# Patient Record
Sex: Male | Born: 1960
Health system: Southern US, Community
[De-identification: ages and names within clinical notes are randomized; demographics above are authoritative.]

## PROBLEM LIST (undated history)

## (undated) DIAGNOSIS — I2510 Atherosclerotic heart disease of native coronary artery without angina pectoris: Secondary | ICD-10-CM

## (undated) DIAGNOSIS — I252 Old myocardial infarction: Secondary | ICD-10-CM

## (undated) DIAGNOSIS — K219 Gastro-esophageal reflux disease without esophagitis: Secondary | ICD-10-CM

## (undated) DIAGNOSIS — J189 Pneumonia, unspecified organism: Secondary | ICD-10-CM

## (undated) DIAGNOSIS — J45909 Unspecified asthma, uncomplicated: Secondary | ICD-10-CM

## (undated) DIAGNOSIS — E7800 Pure hypercholesterolemia, unspecified: Secondary | ICD-10-CM

## (undated) DIAGNOSIS — E119 Type 2 diabetes mellitus without complications: Secondary | ICD-10-CM

## (undated) DIAGNOSIS — E785 Hyperlipidemia, unspecified: Secondary | ICD-10-CM

## (undated) DIAGNOSIS — F32A Depression, unspecified: Secondary | ICD-10-CM

## (undated) DIAGNOSIS — I10 Essential (primary) hypertension: Secondary | ICD-10-CM

## (undated) DIAGNOSIS — K5150 Left sided colitis without complications: Secondary | ICD-10-CM

## (undated) DIAGNOSIS — M797 Fibromyalgia: Secondary | ICD-10-CM

## (undated) DIAGNOSIS — M459 Ankylosing spondylitis of unspecified sites in spine: Secondary | ICD-10-CM

## (undated) DIAGNOSIS — K5190 Ulcerative colitis, unspecified, without complications: Secondary | ICD-10-CM

## (undated) DIAGNOSIS — N529 Male erectile dysfunction, unspecified: Secondary | ICD-10-CM

## (undated) DIAGNOSIS — M1990 Unspecified osteoarthritis, unspecified site: Secondary | ICD-10-CM

## (undated) DIAGNOSIS — E782 Mixed hyperlipidemia: Secondary | ICD-10-CM

## (undated) DIAGNOSIS — C44101 Unspecified malignant neoplasm of skin of unspecified eyelid, including canthus: Secondary | ICD-10-CM

## (undated) DIAGNOSIS — R972 Elevated prostate specific antigen [PSA]: Secondary | ICD-10-CM

## (undated) DIAGNOSIS — D649 Anemia, unspecified: Secondary | ICD-10-CM

## (undated) DIAGNOSIS — I219 Acute myocardial infarction, unspecified: Secondary | ICD-10-CM

## (undated) DIAGNOSIS — S0300XA Dislocation of jaw, unspecified side, initial encounter: Secondary | ICD-10-CM

## (undated) HISTORY — DX: Fibromyalgia: M79.7

## (undated) HISTORY — DX: Hyperlipidemia, unspecified: E78.5

## (undated) HISTORY — PX: COLONOSCOPY: SHX174

## (undated) HISTORY — DX: Unspecified malignant neoplasm of skin of unspecified eyelid, including canthus: C44.101

## (undated) HISTORY — DX: Male erectile dysfunction, unspecified: N52.9

## (undated) HISTORY — DX: Anemia, unspecified: D64.9

## (undated) HISTORY — DX: Unspecified osteoarthritis, unspecified site: M19.90

## (undated) HISTORY — DX: Left sided colitis without complications: K51.50

## (undated) HISTORY — DX: Mixed hyperlipidemia: E78.2

## (undated) HISTORY — DX: Unspecified asthma, uncomplicated: J45.909

## (undated) HISTORY — PX: EYE SURGERY: SHX253

## (undated) HISTORY — DX: Atherosclerotic heart disease of native coronary artery without angina pectoris: I25.10

## (undated) HISTORY — DX: Old myocardial infarction: I25.2

## (undated) HISTORY — DX: Ankylosing spondylitis of unspecified sites in spine: M45.9

## (undated) HISTORY — DX: Ulcerative colitis, unspecified, without complications: K51.90

## (undated) HISTORY — PX: TONSILLECTOMY: SUR1361

## (undated) HISTORY — DX: Dislocation of jaw, unspecified side, initial encounter: S03.00XA

## (undated) HISTORY — DX: Gastro-esophageal reflux disease without esophagitis: K21.9

## (undated) HISTORY — DX: Hypercalcemia: E83.52

## (undated) HISTORY — DX: Pure hypercholesterolemia, unspecified: E78.00

## (undated) HISTORY — PX: CATARACT EXTRACTION, BILATERAL: SHX1313

## (undated) HISTORY — DX: Pneumonia, unspecified organism: J18.9

## (undated) HISTORY — DX: Elevated prostate specific antigen (PSA): R97.20

---

## 1981-03-19 HISTORY — PX: OTHER SURGICAL HISTORY: SHX169

## 2000-09-13 ENCOUNTER — Encounter (INDEPENDENT_AMBULATORY_CARE_PROVIDER_SITE_OTHER): Payer: Self-pay | Admitting: Specialist

## 2000-09-13 ENCOUNTER — Ambulatory Visit (HOSPITAL_COMMUNITY): Admission: RE | Admit: 2000-09-13 | Discharge: 2000-09-13 | Payer: Self-pay | Admitting: Gastroenterology

## 2002-02-14 ENCOUNTER — Emergency Department (HOSPITAL_COMMUNITY): Admission: EM | Admit: 2002-02-14 | Discharge: 2002-02-15 | Payer: Self-pay | Admitting: Emergency Medicine

## 2003-11-08 ENCOUNTER — Ambulatory Visit (HOSPITAL_COMMUNITY): Admission: RE | Admit: 2003-11-08 | Discharge: 2003-11-08 | Payer: Self-pay | Admitting: Gastroenterology

## 2003-11-08 ENCOUNTER — Encounter (INDEPENDENT_AMBULATORY_CARE_PROVIDER_SITE_OTHER): Payer: Self-pay | Admitting: *Deleted

## 2006-12-04 ENCOUNTER — Emergency Department (HOSPITAL_COMMUNITY): Admission: EM | Admit: 2006-12-04 | Discharge: 2006-12-04 | Payer: Self-pay | Admitting: Emergency Medicine

## 2007-01-07 ENCOUNTER — Inpatient Hospital Stay (HOSPITAL_COMMUNITY): Admission: AD | Admit: 2007-01-07 | Discharge: 2007-01-09 | Payer: Self-pay | Admitting: Interventional Cardiology

## 2007-01-07 ENCOUNTER — Inpatient Hospital Stay (HOSPITAL_BASED_OUTPATIENT_CLINIC_OR_DEPARTMENT_OTHER): Admission: RE | Admit: 2007-01-07 | Discharge: 2007-01-07 | Payer: Self-pay | Admitting: Interventional Cardiology

## 2007-02-26 ENCOUNTER — Encounter: Admission: RE | Admit: 2007-02-26 | Discharge: 2007-02-26 | Payer: Self-pay

## 2008-01-13 ENCOUNTER — Encounter: Admission: RE | Admit: 2008-01-13 | Discharge: 2008-01-13 | Payer: Self-pay | Admitting: Chiropractic Medicine

## 2010-08-01 NOTE — Cardiovascular Report (Signed)
[name], [name] NO.:  [id number]   MEDICAL RECORD NO.:  [id number]          PATIENT TYPE:  OIB   LOCATION:  1963                         FACILITY:  [facility]   PHYSICIAN:  Corky Crafts, MDDATE OF BIRTH:  [birth year]   DATE OF PROCEDURE:  01/07/2007  DATE OF DISCHARGE:  01/07/2007                            CARDIAC CATHETERIZATION   REFERRING:  Henrine Screws, M.D.   PROCEDURE PERFORMED:  Left heart catheterization, left ventriculogram,  coronary angiogram, abdominal aortogram.   OPERATOR:  Lance Muss, M.D.   INDICATIONS:  Abnormal stress test, stable angina.   PROCEDURE NARRATIVE:  The risks and benefits of cardiac catheterization  were explained to the patient and informed consent was obtained.  The  patient was brought to the cath lab.  He was prepped and draped in the  usual sterile fashion.  His right groin was infiltrated 1% lidocaine.  A  4-French arterial sheath was placed into the right femoral artery using  the modified Seldinger technique.  Left coronary artery angiography was  performed using a JL-4 pigtail catheter.  The catheter was advanced to  the vessel ostium under fluoroscopic guidance.  Digital angiography was  performed in multiple projections using hand injection of contrast.  Right coronary artery angiography was then performed using a 3-D RCA  catheter.  The catheter was advanced to the vessel ostium under  fluoroscopic guidance.  Digital angiography was performed in multiple  projections using hand injection of contrast.  A pigtail catheter was  then advanced to the ascending aorta and across the aortic valve under  fluoroscopic guidance.  Power injection of contrast was performed in the  RAO position to image the left ventricle.  The catheter was pulled back  under continuous hemodynamic pressure monitoring.  The catheter was then  pulled back to the level of the renal arteries in the abdominal aorta.  A power injection  of contrast was given in the AP projection.  The  sheath was then removed using manual compression.   FINDINGS:  The left main is widely patent.  The left circumflex is a  medium-sized vessel.  There is a long first obtuse marginal which is  medium size.  There are minor irregularities throughout.  Both are  widely patent.  The left anterior descending is a large caliber vessel  with 95% midvessel stenosis.  There is a 40-50% diffuse stenosis more  distally in the LAD.  The first diagonal is a medium-sized vessel with  an ostial 25% stenosis.  The second diagonal is a medium-sized vessel.  The third diagonal is a small vessel but patent.  Right coronary artery  is a large dominant vessel.  It appears angiographically normal.  The  left ventriculogram reveals a small area of apical hypokinesis.  Overall  estimated ejection fraction is 55-60%.  Abdominal aortogram shows no  abdominal aortic aneurysm.  There are single renal arteries bilaterally,  both of which are widely patent.   HEMODYNAMIC RESULTS:  Left ventricular pressure of 124/7 with an LVEDP  of 13 mmHg.  Aortic pressure 133/89 with a mean aortic pressure of 110  mmHg.   IMPRESSION:  1. Normal left ventricular function.  2. Single-vessel coronary artery disease in the mid left anterior      descending with a stenosis of 95%.  There is moderate      atherosclerosis throughout the remainder of the vessel.  3. Patent right coronary artery and left circumflex.  4. No renal artery stenosis.  No abdominal aortic aneurysm.   RECOMMENDATIONS:  The patient has severe LAD disease.  He will be loaded  with Plavix.  He has had some pain at rest.  We will admit him and add  Integrilin later if his groin is stable.  Will plan for PCI in the  morning.  The LAD lesion does appear somewhat thrombotic and it is  extremely tight.  In the setting of rest pain, I think it is safer to  keep him in the hospital.      Corky Crafts, MD   Electronically Signed     JSV/MEDQ  D:  01/08/2007  T:  01/09/2007  Job:  [id number]

## 2010-08-01 NOTE — Cardiovascular Report (Signed)
[name], [name]              ACCOUNT NO.:  [id number]   MEDICAL RECORD NO.:  [id number]          PATIENT TYPE:  INP   LOCATION:  6531                         FACILITY:  [facility]   PHYSICIAN:  Corky Crafts, MDDATE OF BIRTH:  23-Nov-1960   DATE OF PROCEDURE:  01/08/2007  DATE OF DISCHARGE:                            CARDIAC CATHETERIZATION   PROCEDURE PERFORMED:  PCI of the LAD.   OPERATOR:  Dr. Eldridge Dace.   INDICATIONS:  Unstable angina, abnormal stress test.   PROCEDURE:  The risks and benefits of cardiac catheterization were  explained to the patient and informed consent was obtained.  The patient  was brought to the cath lab.  He was prepped and draped in the usual  sterile fashion.  His right groin was infiltrated with 1% lidocaine.  A  6-French arterial sheath was placed into his right femoral artery using  modified Seldinger technique.  Diagnostic angiography using the guide  catheter showed a 95% mid-LAD lesion.  There was 40-50% moderate disease  noted in the more mid to distal part of the LAD.  This improved after  nitroglycerin and certainly looked much better than it did on  yesterday's diagnostic cath.  Please see below for details of the PCI.   PCI NARRATIVE:  CLS4 guiding catheter was used to intubate the ostium of  the left main.  A Prowater wire was placed in the first diagonal.  A BMW  wire was placed in the LAD.  A 2.5 x 12-mm Voyager balloon was then  inflated to 10 atmospheres for 22 seconds.  This did not significantly  affect the stenosis.  The balloon was replaced and inflated 12  atmospheres for 28 seconds.  There was improved flow.  A 3.5 x 15-mm  Vision stent was then placed across the lesion and deployed at 14  atmospheres for 51 seconds.  The stent was postdilated with a 4.0 x 12-  mm Quantum Maverick balloon inflated to 16 atmospheres for 43 seconds in  the distal part of the stent, and then 16 atmospheres for 28 seconds in  the more proximal  part of the stent.  There is an excellent angiographic  result.  The patient maintained TIMI III flow.  There was no residual  stenosis.   IMPRESSION:  1. Successful bare metal stent placement to the mid LAD with a 3.5 x      15-mm Vision stent postdilated to greater than 4 mm in diameter.  2. Heparin and Integrilin was used.  3. Star close used for hemostasis.   RECOMMENDATIONS:  Continue aspirin and Plavix along with other secondary  prevention.  Will watch the patient overnight.  His aspirin and Plavix  will have to be continued for a minimum of 30 days.      Corky Crafts, MD  Electronically Signed    JSV/MEDQ  D:  01/08/2007  T:  01/09/2007  Job:  [id number]

## 2010-08-01 NOTE — Discharge Summary (Signed)
NAMEMARRIO, [name]              ACCOUNT NO.:  [id number]   MEDICAL RECORD NO.:  [id number]          PATIENT TYPE:  INP   LOCATION:  6531                         FACILITY:  [facility]   PHYSICIAN:  Corky Crafts, MDDATE OF BIRTH:  Apr 10, 1960   DATE OF ADMISSION:  01/07/2007  DATE OF DISCHARGE:  01/09/2007                               DISCHARGE SUMMARY   DISCHARGE DIAGNOSES:  1. Coronary artery disease, status post bare-metal stent to the left      anterior descending.  2. Hypokalemia, repleted.  3. History of asthma.  4. History of arthritis.  5. History of colitis.   HOSPITAL COURSE:  Mr. [name] underwent a cardiac catheterization for  abnormal stress test.  Catheterization occurred on January 07, 2007, and  showed 95% mid-LAD lesion.  He was admitted to the hospital and  underwent percutaneous intervention utilizing a bare metal stent to that  region reducing it to a 0% post procedure.  He was watched overnight.  He was felt to be ready for discharge home the following day.   LABORATORY DATA:  During that time showed a BUN of 9, creatinine 0.88,  potassium 3.4, (repleted), hemoglobin 10.9, hematocrit 35.3.   His vital signs were stable.  His right groin was stable.  He had  palpable right lower extremity pulses.  He was discharged to home.   DISCHARGE MEDICATIONS:  1. Theophylline twice a day.  2. Prednisone 10 mg a day.  3. Prilosec daily as needed.  4. Hydrocodone as needed for pain.  5. Enteric-coated aspirin 325 mg a day.  6. Plavix 75 mg a day for at least 30 days.  7. Crestor 5 mg a day.  8. Sulfasalazine 1 g 4 times a day.  9. Sublingual nitroglycerin p.r.n. pain.   Remain on a low-sodium, heart healthy diet.  Increase activity slowly.  No lifting over 10 pounds for 1 week.  No driving for 10 days, and clean  catheter site gently with soap and water.  No scrubbing.  Follow up with  Dr. Eldridge Dace on January 24, 2007, at 9:30 a.m.  Call for any further  questions or concern.      Guy Franco, P.A.      Corky Crafts, MD  Electronically Signed    LB/MEDQ  D:  01/09/2007  T:  01/09/2007  Job:  [id number]

## 2010-08-04 NOTE — Procedures (Signed)
[facility]  Patient:    [name], [name]                     MRN: [id number] Proc. Date: 09/13/00 Adm. Date:  [id number] Attending:  Louie Bun CC:         Vikki Ports, M.D.   Procedure Report  PROCEDURE:  Colonoscopy with biopsy.  INDICATION FOR PROCEDURE:  Chronic ulcerative colitis for greater than 10 years duration.  DESCRIPTION OF PROCEDURE:  The patient was placed in the left lateral decubitus position and placed on the pulse monitor with continuous low-flow oxygen delivered by nasal cannula.  He was sedated with 75 mg IV Demerol and 10 mg IV Versed.  The Olympus video colonoscope was inserted into the rectum and advanced to the cecum, confirmed by transillumination at McBurneys point and visualization of the ileocecal valve and appendiceal orifice.  The prep was excellent.  The cecum, ascending, and transverse colon appeared normal with no visible signs of inflammation, masses, polyps, diverticula, or other mucosal abnormalities.  Beginning at about 40 cm, there was a granular appearance to the mucosa with more erythema and edema as the scope was withdrawn distally, consistent with ulcerative colitis which was confluent all the way down to the rectum.  No pseudopolyps, deep fissures, or ulcers were seen.  Biopsies were taken of the endoscopically normal-appearing cecum, ascending, and transverse colon and sent in one specimen container, and representative inflamed areas of the sigmoid and rectum were sent in a separate specimen container.  The scope was then withdrawn, and the patient returned to the recovery room in stable condition.  He tolerated the procedure well, and there were no immediate complications.  IMPRESSION:  Visible ulcerative colitis of moderate severity endoscopically to the about the proximal sigmoid colon.  PLAN:  Await biopsy results to rule out any dysplasia. DD:  09/13/00 TD:  09/13/00 Job:  [id number]

## 2010-08-04 NOTE — Op Note (Signed)
NAME:  [name], [name]                        ACCOUNT NO.:  [id number]   MEDICAL RECORD NO.:  [id number]                   PATIENT TYPE:  AMB   LOCATION:  ENDO                                 FACILITY:  [facility]   PHYSICIAN:  John C. Madilyn Fireman, M.D.                 DATE OF BIRTH:  1960-06-23   DATE OF PROCEDURE:  11/08/2003  DATE OF DISCHARGE:                                 OPERATIVE REPORT   PROCEDURE:  Colonoscopy with biopsy.   INDICATIONS FOR PROCEDURE:  Chronic ulcerative colitis greater than 10 years  duration.   DESCRIPTION OF PROCEDURE:  The patient was placed in the left lateral  decubitus position and placed on the pulse monitor with continuous low-flow  oxygen delivered by nasal cannula.  He was sedated with 100 mcg IV fentanyl  and 8 mg IV Versed.  The Olympus video colonoscope was inserted into the  rectum and advanced to the cecum, confirmed by transillumination at  McBurney's point and visualization of the ileocecal valve and appendiceal  orifice.  The prep was excellent.  The terminal ileum was intubated and  explored for several centimeters and appeared to be within normal limits.  The cecum, ascending, transverse, descending, and sigmoid all appeared  normal with no masses, polyps, diverticula, or other mucosal abnormalities.  Random biopsies were taken of the ascending and transverse colon and random  biopsies of the descending colon from 40-50 cm were also obtained and sent  in a separate specimen container.  Beginning at about the rectosigmoid  junction, there was a short transition to diffuse mild to moderate  granularity with erythema, edema, and some overlying exudate consistent with  ulcerative colitis.  Biopsies were taken of the rectum and rectosigmoid and  sent in a separate specimen container.  The scope was then withdrawn, and  the patient returned to the recovery room in stable condition.  He tolerated  the procedure well, and there were no immediate  complications.   IMPRESSION:  Moderate colitis involving the rectosigmoid and rectum.   PLAN:  Continue current treatment with Colazal.  If he has increased disease  activity, he would be an excellent candidate for enema therapy.                                               John C. Madilyn Fireman, M.D.    JCH/MEDQ  D:  11/08/2003  T:  11/08/2003  Job:  [id number]   cc:   Areatha Keas, M.D.  [address]  [address]  [place]  [place] [address]  Fax: [phone]   Dellis Anes. Idell Pickles, M.D.  [address]  [place]  [place] [address]  Fax: [phone]

## 2010-12-27 LAB — BASIC METABOLIC PANEL
BUN: 12
CO2: 24
Creatinine, Ser: 0.88
Glucose, Bld: 85
Glucose, Bld: 92
Potassium: 3.4 — ABNORMAL LOW

## 2010-12-27 LAB — CBC
HCT: 33.4 — ABNORMAL LOW
HCT: 35.3 — ABNORMAL LOW
MCHC: 30.4
MCV: 66.5 — ABNORMAL LOW
RBC: 4.86
RBC: 5.34
RDW: 24.3 — ABNORMAL HIGH
RDW: 24.7 — ABNORMAL HIGH
WBC: 9
WBC: 9.7

## 2010-12-27 LAB — PROTIME-INR
INR: 1
Prothrombin Time: 13.7

## 2010-12-28 LAB — BASIC METABOLIC PANEL
BUN: 12
Calcium: 9.9
Chloride: 105
GFR calc Af Amer: >60
Potassium: 3.7

## 2010-12-28 LAB — CBC
HCT: 33.3 — ABNORMAL LOW
Hemoglobin: 10.1 — ABNORMAL LOW
RBC: 5.34
RDW: 20.2 — ABNORMAL HIGH
WBC: 10.1

## 2010-12-28 LAB — DIFFERENTIAL
Basophils Relative: 1
Eosinophils Relative: 1
Monocytes Relative: 9

## 2010-12-28 LAB — POCT CARDIAC MARKERS: Troponin i, poc: <0.05

## 2012-06-05 ENCOUNTER — Other Ambulatory Visit: Payer: Self-pay | Admitting: Gastroenterology

## 2012-12-25 ENCOUNTER — Encounter: Payer: Self-pay | Admitting: Interventional Cardiology

## 2012-12-25 DIAGNOSIS — K219 Gastro-esophageal reflux disease without esophagitis: Secondary | ICD-10-CM

## 2012-12-25 DIAGNOSIS — M459 Ankylosing spondylitis of unspecified sites in spine: Secondary | ICD-10-CM | POA: Insufficient documentation

## 2012-12-25 DIAGNOSIS — E782 Mixed hyperlipidemia: Secondary | ICD-10-CM | POA: Insufficient documentation

## 2012-12-25 DIAGNOSIS — R972 Elevated prostate specific antigen [PSA]: Secondary | ICD-10-CM | POA: Insufficient documentation

## 2012-12-25 DIAGNOSIS — E7800 Pure hypercholesterolemia, unspecified: Secondary | ICD-10-CM

## 2012-12-25 DIAGNOSIS — Z789 Other specified health status: Secondary | ICD-10-CM | POA: Insufficient documentation

## 2012-12-25 DIAGNOSIS — K5150 Left sided colitis without complications: Secondary | ICD-10-CM

## 2012-12-25 DIAGNOSIS — I2510 Atherosclerotic heart disease of native coronary artery without angina pectoris: Secondary | ICD-10-CM

## 2012-12-25 DIAGNOSIS — J309 Allergic rhinitis, unspecified: Secondary | ICD-10-CM | POA: Insufficient documentation

## 2012-12-25 DIAGNOSIS — J45909 Unspecified asthma, uncomplicated: Secondary | ICD-10-CM | POA: Insufficient documentation

## 2012-12-25 DIAGNOSIS — D649 Anemia, unspecified: Secondary | ICD-10-CM | POA: Insufficient documentation

## 2012-12-25 DIAGNOSIS — D509 Iron deficiency anemia, unspecified: Secondary | ICD-10-CM

## 2012-12-25 DIAGNOSIS — E785 Hyperlipidemia, unspecified: Secondary | ICD-10-CM

## 2012-12-25 DIAGNOSIS — M25559 Pain in unspecified hip: Secondary | ICD-10-CM | POA: Insufficient documentation

## 2012-12-25 HISTORY — DX: Unspecified asthma, uncomplicated: J45.909

## 2012-12-25 HISTORY — DX: Pure hypercholesterolemia, unspecified: E78.00

## 2012-12-25 HISTORY — DX: Elevated prostate specific antigen (PSA): R97.20

## 2012-12-25 HISTORY — DX: Atherosclerotic heart disease of native coronary artery without angina pectoris: I25.10

## 2012-12-25 HISTORY — DX: Mixed hyperlipidemia: E78.2

## 2012-12-25 HISTORY — DX: Hypercalcemia: E83.52

## 2012-12-25 HISTORY — DX: Gastro-esophageal reflux disease without esophagitis: K21.9

## 2012-12-25 HISTORY — DX: Left sided colitis without complications: K51.50

## 2012-12-30 ENCOUNTER — Encounter: Payer: Self-pay | Admitting: Interventional Cardiology

## 2012-12-30 ENCOUNTER — Ambulatory Visit (INDEPENDENT_AMBULATORY_CARE_PROVIDER_SITE_OTHER): Payer: BC Managed Care – PPO | Admitting: Interventional Cardiology

## 2012-12-30 VITALS — BP 130/80 | HR 89 | Ht 69.0 in | Wt 196.0 lb

## 2012-12-30 DIAGNOSIS — I2510 Atherosclerotic heart disease of native coronary artery without angina pectoris: Secondary | ICD-10-CM

## 2012-12-30 DIAGNOSIS — E785 Hyperlipidemia, unspecified: Secondary | ICD-10-CM | POA: Insufficient documentation

## 2012-12-30 NOTE — Progress Notes (Signed)
Patient ID: [name], male   DOB: 27-Apr-1960, 52 y.o.   MRN: [id number]    [address] [place], [place]  [address] Phone: [phone] Fax:  [phone]  Date:  12/30/2012   ID:  [name], DOB March 29, 1960, MRN [id number]  PCP:  Lolita Patella, MD      History of Present Illness: [name] is a 52 y.o. male  who had an LAD stent in 2008. He has done well since that time. No chest pain. He walks 4x/week for 1.5 miles and plays basketball occasionally. No chest tightness. He does a lot of yardwork. CAD/ASCVD seasonal allergies. Denies : Chest pain.  Dizziness. Dyspnea on exertion.  Leg edema.  Nitroglycerin.  Orthopnea.  Palpitations.  Paroxysmal nocturnal dyspnea.  Syncope.  Trouble with meds.      Wt Readings from Last 3 Encounters:  12/30/12 196 lb (88.905 kg)     Past Medical History  Diagnosis Date  . Asthma     SINCE AGE 37  . Colitis, ulcerative   . Ankylosing spondylitis   . GERD (gastroesophageal reflux disease)   . TMJ (dislocation of temporomandibular joint)   . Coronary artery disease     LAD stent 10/08  . Hyperlipidemia   . Fibromyalgia   . ED (erectile dysfunction)   . Anemia   . Pneumonia     as a child    Current Outpatient Prescriptions  Medication Sig Dispense Refill  . albuterol (PROVENTIL HFA;VENTOLIN HFA) 108 (90 BASE) MCG/ACT inhaler Inhale 2 puffs into the lungs every 6 (six) hours as needed for wheezing.      Marland Kitchen aspirin 81 MG tablet Take 81 mg by mouth daily.      Marland Kitchen atorvastatin (LIPITOR) 40 MG tablet Take 40 mg by mouth daily.      . ferrous sulfate 325 (65 FE) MG EC tablet Take 325 mg by mouth 2 (two) times daily.      . fish oil-omega-3 fatty acids 1000 MG capsule Take 2 g by mouth 2 (two) times daily.      . Multiple Vitamins-Minerals (CENTRUM CARDIO) TABS Take by mouth 2 (two) times daily.      . nitroGLYCERIN (NITROSTAT) 0.4 MG SL tablet Place 0.4 mg under the tongue every 5 (five)  minutes as needed for chest pain.      Marland Kitchen omeprazole (PRILOSEC) 20 MG capsule Take 20 mg by mouth every other day.      . predniSONE (DELTASONE) 5 MG tablet Take 5 mg by mouth daily.      Marland Kitchen sulfaSALAzine (AZULFIDINE) 500 MG EC tablet Take 500 mg by mouth. 2 TABLETS BID      . theophylline (THEODUR) 300 MG 12 hr tablet Take 300 mg by mouth 2 (two) times daily.       No current facility-administered medications for this visit.    Allergies:   No Known Allergies  Social History:  The patient  reports that he has never smoked. He does not have any smokeless tobacco history on file. He reports that he drinks alcohol.   Family History:  The patient's family history is not on file.   ROS:  Please see the history of present illness.  No nausea, vomiting.  No fevers, chills.  No focal weakness.  No dysuria. Occasional nosebleeds.   All other systems reviewed and negative.   PHYSICAL EXAM: VS:  BP 130/80  Pulse 89  Ht 5\' 9"  (1.753 m)  Wt 196 lb (88.905 kg)  BMI 28.93 kg/m2 Well nourished, well developed, in no acute distress HEENT: normal Neck: no JVD, no carotid bruits Cardiac:  normal S1, S2; RRR;  Lungs:  clear to auscultation bilaterally, no wheezing, rhonchi or rales Abd: soft, nontender, no hepatomegaly; hernia Ext: no edema Skin: warm and dry Neuro:   no focal abnormalities noted  EKG:  NSR, no ST segment changes     ASSESSMENT AND PLAN:  Coronary atherosclerosis of native coronary artery  Continue Aspir-81 Tablet Delayed Release, 81 MG, 1 tablet, Orally, Once a day Diagnostic Imaging:EKG [name],Amy 01/01/2012 11:50:20 AM > [name],[name] 01/01/2012 12:29:24 PM > NSR, no ST segment changes - unchanged on 12/30/12. No angina.    2. Mixed hyperlipidemia  Continue AtorvastatinTablet, 80MG , TAKE 1 TABLET DAILY Continue Nitroglycerin SL tablet, 0.4 mg, 1 tablet, SL, as needed for chest tightening, 25, Refills 6 Lipids well controlled. Reviewed.    3. Obesity  COntinue to try to  exercise and diet to lose weight. Lost weight after dental extraction.  Exercise has been limited in the past by arthritis.    Preventive Medicine  Adult topics discussed:  Diet: healthy diet, low calorie, low fat.  Exercise: at least 30 minutes of aerobic exercise, 5 days a week.      Signed, Fredric Mare, MD, [facility] 12/30/2012 11:24 AM

## 2012-12-30 NOTE — Patient Instructions (Signed)
Your physician wants you to follow-up in: 1 year with Dr. Varanasi. You will receive a reminder letter in the mail two months in advance. If you don't receive a letter, please call our office to schedule the follow-up appointment.  Your physician recommends that you continue on your current medications as directed. Please refer to the Current Medication list given to you today.  

## 2013-01-14 ENCOUNTER — Other Ambulatory Visit: Payer: Self-pay | Admitting: Interventional Cardiology

## 2013-05-07 ENCOUNTER — Other Ambulatory Visit: Payer: Self-pay

## 2013-10-14 ENCOUNTER — Encounter: Payer: Self-pay | Admitting: Interventional Cardiology

## 2013-10-16 ENCOUNTER — Other Ambulatory Visit: Payer: Self-pay | Admitting: *Deleted

## 2013-10-16 MED ORDER — ATORVASTATIN CALCIUM 80 MG PO TABS: ORAL_TABLET | ORAL | Status: DC

## 2013-12-14 ENCOUNTER — Other Ambulatory Visit: Payer: Self-pay | Admitting: Interventional Cardiology

## 2013-12-28 ENCOUNTER — Other Ambulatory Visit: Payer: Self-pay | Admitting: Interventional Cardiology

## 2014-01-20 ENCOUNTER — Other Ambulatory Visit: Payer: Self-pay | Admitting: Interventional Cardiology

## 2014-01-29 ENCOUNTER — Encounter: Payer: Self-pay | Admitting: Nurse Practitioner

## 2014-01-29 ENCOUNTER — Ambulatory Visit (INDEPENDENT_AMBULATORY_CARE_PROVIDER_SITE_OTHER): Payer: 59 | Admitting: Nurse Practitioner

## 2014-01-29 VITALS — BP 110/70 | HR 93 | Ht 68.0 in | Wt 185.1 lb

## 2014-01-29 DIAGNOSIS — I2510 Atherosclerotic heart disease of native coronary artery without angina pectoris: Secondary | ICD-10-CM

## 2014-01-29 MED ORDER — NITROGLYCERIN 0.4 MG SL SUBL: 0.4000 mg | SUBLINGUAL_TABLET | SUBLINGUAL | Status: DC | PRN

## 2014-01-29 MED ORDER — ATORVASTATIN CALCIUM 80 MG PO TABS: 80.0000 mg | ORAL_TABLET | Freq: Every day | ORAL | Status: DC

## 2014-01-29 NOTE — Patient Instructions (Addendum)
Stay on your current medicines  Come back for fasting labs in the next few weeks - this is to check your liver tests to make sure your medicine is not hurting you  See Dr. Irish Lack in one year  Call the [place] office at [phone] if you have any questions, problems or concerns.

## 2014-01-29 NOTE — Progress Notes (Signed)
[name] Date of Birth: 1961/01/20 Medical Record #[id number]  History of Present Illness: [name] is seen back today for a follow up visit - seen for Dr. Irish Lack. He has known CAD with prior LAD stent in 2008. Other issues include HLD, anemia, ulcerative colitis, fibromyalgia, ED, GERD and asthma.   Last seen here in October of 2014. Was doing well.  Comes in today. Here alone. Needs medicine refilled and that is really only why he is here. He is doing well. No chest pain or shortness of breath. He has not had recent lipids that he is aware of - he is not fasting today. Just ate about 2 hours ago. He is walking. Just got a new puppy. He has no complaint.   Current Outpatient Prescriptions  Medication Sig Dispense Refill  . albuterol (PROVENTIL HFA;VENTOLIN HFA) 108 (90 BASE) MCG/ACT inhaler Inhale 2 puffs into the lungs every 6 (six) hours as needed for wheezing.    Marland Kitchen aspirin 81 MG tablet Take 81 mg by mouth daily.    Marland Kitchen atorvastatin (LIPITOR) 80 MG tablet Take 1 tablet by mouth once a day 90 tablet 0  . cyclobenzaprine (FLEXERIL) 5 MG tablet Take 5 mg by mouth at bedtime.     . ferrous sulfate 325 (65 FE) MG EC tablet Take 325 mg by mouth 2 (two) times daily.    . fish oil-omega-3 fatty acids 1000 MG capsule Take 2 g by mouth 2 (two) times daily.    Marland Kitchen HYDROcodone-acetaminophen (NORCO) 10-325 MG per tablet Take 1-2 tablets by mouth daily.   0  . LYRICA 150 MG capsule Take 150 mg by mouth 2 (two) times daily.     . Multiple Vitamins-Minerals (CENTRUM CARDIO) TABS Take by mouth 2 (two) times daily.    . nitroGLYCERIN (NITROSTAT) 0.4 MG SL tablet Place 0.4 mg under the tongue every 5 (five) minutes as needed for chest pain.    Marland Kitchen omeprazole (PRILOSEC) 20 MG capsule Take 20 mg by mouth every other day.    . predniSONE (DELTASONE) 5 MG tablet Take 5 mg by mouth daily.    Marland Kitchen sulfaSALAzine (AZULFIDINE) 500 MG EC tablet Take 500 mg by mouth. 2 TABLETS BID    . theophylline (THEODUR) 300  MG 12 hr tablet Take 300 mg by mouth 2 (two) times daily.     No current facility-administered medications for this visit.    No Known Allergies  Past Medical History  Diagnosis Date  . Asthma     SINCE AGE 89  . Colitis, ulcerative   . Ankylosing spondylitis   . GERD (gastroesophageal reflux disease)   . TMJ (dislocation of temporomandibular joint)   . Coronary artery disease     Bare metal LAD stent 10/08  . Hyperlipidemia   . Fibromyalgia   . ED (erectile dysfunction)   . Anemia   . Pneumonia     as a child    No past surgical history on file.  History  Smoking status  . Never Smoker   Smokeless tobacco  . Not on file    History  Alcohol Use  . Yes    Family History  Problem Relation Age of Onset  . Hypertension Mother   . Lung cancer Father     Review of Systems: The review of systems is per the HPI.  All other systems were reviewed and are negative.  Physical Exam: Ht 5\' 8"  (1.727 m)  Wt 185 lb 1.9 oz (83.97  kg)  BMI 28.15 kg/m2 Patient is alert and in no acute distress. Skin is warm and dry. Color is normal.  HEENT is unremarkable. Normocephalic/atraumatic. PERRL. Sclera are nonicteric. Neck is supple. No masses. No JVD. Lungs are clear. Cardiac exam shows a regular rate and rhythm. Abdomen is soft. Extremities are without edema. Gait and ROM are intact. No gross neurologic deficits noted.  Wt Readings from Last 3 Encounters:  01/29/14 185 lb 1.9 oz (83.97 kg)  12/30/12 196 lb (88.905 kg)    LABORATORY DATA/PROCEDURES:  Lab Results  Component Value Date   WBC 9.7 01/09/2007   HGB 10.9* 01/09/2007   HCT 35.3* 01/09/2007   PLT 457* 01/09/2007   GLUCOSE 85 01/09/2007   NA 140 01/09/2007   K 3.4* 01/09/2007   CL 107 01/09/2007   CREATININE 0.88 01/09/2007   BUN 9 01/09/2007   CO2 25 01/09/2007   INR 1.0 01/08/2007    BNP (last 3 results) No results for input(s): PROBNP in the last 8760 hours.   Assessment / Plan: 1. Single vessel CAD  with prior PCI to the LAD -  Doing well clinically - continue with CV risk factor modification.   2. HLD -  Not fasting today - needs to get his labs checked - he says he will call back to schedule.   Medicines refilled today. See back in one year.   Patient is agreeable to this plan and will call if any problems develop in the interim.   Burtis Junes, RN, [place] [address] [place], [place]  [address] [phone]

## 2014-02-04 ENCOUNTER — Other Ambulatory Visit (INDEPENDENT_AMBULATORY_CARE_PROVIDER_SITE_OTHER): Payer: 59 | Admitting: *Deleted

## 2014-02-04 DIAGNOSIS — I2510 Atherosclerotic heart disease of native coronary artery without angina pectoris: Secondary | ICD-10-CM

## 2014-02-04 LAB — BASIC METABOLIC PANEL
BUN: 24 mg/dL — ABNORMAL HIGH (ref 6–23)
CO2: 28 mEq/L (ref 19–32)
Calcium: 9.3 mg/dL (ref 8.4–10.5)
Chloride: 103 mEq/L (ref 96–112)
Creatinine, Ser: 0.8 mg/dL (ref 0.4–1.5)
GFR: 107.4 mL/min (ref >60.00)
Glucose, Bld: 104 mg/dL — ABNORMAL HIGH (ref 70–99)
Potassium: 3.5 mEq/L (ref 3.5–5.1)
Sodium: 138 mEq/L (ref 135–145)

## 2014-02-04 LAB — LIPID PANEL
Cholesterol: 100 mg/dL (ref 0–200)
HDL: 31.1 mg/dL — ABNORMAL LOW (ref >39.00)
LDL Cholesterol: 48 mg/dL (ref 0–99)
NonHDL: 68.9
Total CHOL/HDL Ratio: 3
Triglycerides: 103 mg/dL (ref 0.0–149.0)
VLDL: 20.6 mg/dL (ref 0.0–40.0)

## 2014-02-04 LAB — HEPATIC FUNCTION PANEL
ALT: 24 U/L (ref 0–53)
AST: 18 U/L (ref 0–37)
Albumin: 3.8 g/dL (ref 3.5–5.2)
Alkaline Phosphatase: 77 U/L (ref 39–117)
Bilirubin, Direct: 0.1 mg/dL (ref 0.0–0.3)
Total Bilirubin: 0.9 mg/dL (ref 0.2–1.2)
Total Protein: 7.4 g/dL (ref 6.0–8.3)

## 2014-04-08 ENCOUNTER — Other Ambulatory Visit: Payer: Self-pay | Admitting: Ophthalmology

## 2014-11-09 ENCOUNTER — Encounter: Payer: Self-pay | Admitting: Interventional Cardiology

## 2015-07-18 ENCOUNTER — Other Ambulatory Visit: Payer: Self-pay | Admitting: Gastroenterology

## 2015-09-12 DIAGNOSIS — Z0000 Encounter for general adult medical examination without abnormal findings: Secondary | ICD-10-CM | POA: Diagnosis not present

## 2015-09-12 DIAGNOSIS — J45909 Unspecified asthma, uncomplicated: Secondary | ICD-10-CM | POA: Diagnosis not present

## 2015-09-12 DIAGNOSIS — Z125 Encounter for screening for malignant neoplasm of prostate: Secondary | ICD-10-CM | POA: Diagnosis not present

## 2015-09-12 DIAGNOSIS — E7800 Pure hypercholesterolemia, unspecified: Secondary | ICD-10-CM | POA: Diagnosis not present

## 2015-10-10 DIAGNOSIS — N400 Enlarged prostate without lower urinary tract symptoms: Secondary | ICD-10-CM | POA: Diagnosis not present

## 2015-10-10 DIAGNOSIS — R972 Elevated prostate specific antigen [PSA]: Secondary | ICD-10-CM | POA: Diagnosis not present

## 2015-10-25 DIAGNOSIS — J45909 Unspecified asthma, uncomplicated: Secondary | ICD-10-CM | POA: Diagnosis not present

## 2015-11-17 DIAGNOSIS — M5416 Radiculopathy, lumbar region: Secondary | ICD-10-CM | POA: Diagnosis not present

## 2015-11-17 DIAGNOSIS — M542 Cervicalgia: Secondary | ICD-10-CM | POA: Diagnosis not present

## 2015-11-17 DIAGNOSIS — M9901 Segmental and somatic dysfunction of cervical region: Secondary | ICD-10-CM | POA: Diagnosis not present

## 2015-11-17 DIAGNOSIS — M9903 Segmental and somatic dysfunction of lumbar region: Secondary | ICD-10-CM | POA: Diagnosis not present

## 2015-12-07 DIAGNOSIS — M5136 Other intervertebral disc degeneration, lumbar region: Secondary | ICD-10-CM | POA: Diagnosis not present

## 2015-12-07 DIAGNOSIS — M450 Ankylosing spondylitis of multiple sites in spine: Secondary | ICD-10-CM | POA: Diagnosis not present

## 2015-12-07 DIAGNOSIS — M797 Fibromyalgia: Secondary | ICD-10-CM | POA: Diagnosis not present

## 2015-12-07 DIAGNOSIS — R5383 Other fatigue: Secondary | ICD-10-CM | POA: Diagnosis not present

## 2015-12-07 DIAGNOSIS — M8580 Other specified disorders of bone density and structure, unspecified site: Secondary | ICD-10-CM | POA: Diagnosis not present

## 2016-01-11 DIAGNOSIS — R972 Elevated prostate specific antigen [PSA]: Secondary | ICD-10-CM | POA: Diagnosis not present

## 2016-01-18 DIAGNOSIS — R972 Elevated prostate specific antigen [PSA]: Secondary | ICD-10-CM | POA: Diagnosis not present

## 2016-01-19 ENCOUNTER — Other Ambulatory Visit: Payer: Self-pay | Admitting: Urology

## 2016-01-19 DIAGNOSIS — R972 Elevated prostate specific antigen [PSA]: Secondary | ICD-10-CM

## 2016-01-30 ENCOUNTER — Other Ambulatory Visit: Payer: Self-pay | Admitting: Urology

## 2016-02-02 ENCOUNTER — Ambulatory Visit
Admission: RE | Admit: 2016-02-02 | Discharge: 2016-02-02 | Disposition: A | Discharge status: Home / Self Care | Payer: BLUE CROSS/BLUE SHIELD | Source: Ambulatory Visit | Attending: Urology | Admitting: Urology

## 2016-02-02 DIAGNOSIS — R972 Elevated prostate specific antigen [PSA]: Secondary | ICD-10-CM

## 2016-02-02 MED ORDER — GADOBENATE DIMEGLUMINE 529 MG/ML IV SOLN
17.0000 mL | Freq: Once | INTRAVENOUS | Status: AC | PRN
  Administered 2016-02-02: 17 mL via INTRAVENOUS

## 2016-02-28 DIAGNOSIS — K5100 Ulcerative (chronic) pancolitis without complications: Secondary | ICD-10-CM | POA: Diagnosis not present

## 2016-02-28 DIAGNOSIS — C4499 Other specified malignant neoplasm of skin, unspecified: Secondary | ICD-10-CM | POA: Diagnosis not present

## 2016-03-07 DIAGNOSIS — M8580 Other specified disorders of bone density and structure, unspecified site: Secondary | ICD-10-CM | POA: Diagnosis not present

## 2016-03-07 DIAGNOSIS — M450 Ankylosing spondylitis of multiple sites in spine: Secondary | ICD-10-CM | POA: Diagnosis not present

## 2016-03-07 DIAGNOSIS — M5136 Other intervertebral disc degeneration, lumbar region: Secondary | ICD-10-CM | POA: Diagnosis not present

## 2016-03-07 DIAGNOSIS — M797 Fibromyalgia: Secondary | ICD-10-CM | POA: Diagnosis not present

## 2016-03-26 DIAGNOSIS — M9905 Segmental and somatic dysfunction of pelvic region: Secondary | ICD-10-CM | POA: Diagnosis not present

## 2016-03-26 DIAGNOSIS — M9903 Segmental and somatic dysfunction of lumbar region: Secondary | ICD-10-CM | POA: Diagnosis not present

## 2016-03-26 DIAGNOSIS — M9904 Segmental and somatic dysfunction of sacral region: Secondary | ICD-10-CM | POA: Diagnosis not present

## 2016-03-26 DIAGNOSIS — M545 Low back pain: Secondary | ICD-10-CM | POA: Diagnosis not present

## 2016-05-08 DIAGNOSIS — M450 Ankylosing spondylitis of multiple sites in spine: Secondary | ICD-10-CM | POA: Diagnosis not present

## 2016-05-23 DIAGNOSIS — M450 Ankylosing spondylitis of multiple sites in spine: Secondary | ICD-10-CM | POA: Diagnosis not present

## 2016-06-05 DIAGNOSIS — M5136 Other intervertebral disc degeneration, lumbar region: Secondary | ICD-10-CM | POA: Diagnosis not present

## 2016-06-05 DIAGNOSIS — M8580 Other specified disorders of bone density and structure, unspecified site: Secondary | ICD-10-CM | POA: Diagnosis not present

## 2016-06-05 DIAGNOSIS — M450 Ankylosing spondylitis of multiple sites in spine: Secondary | ICD-10-CM | POA: Diagnosis not present

## 2016-06-05 DIAGNOSIS — M797 Fibromyalgia: Secondary | ICD-10-CM | POA: Diagnosis not present

## 2016-06-20 DIAGNOSIS — M450 Ankylosing spondylitis of multiple sites in spine: Secondary | ICD-10-CM | POA: Diagnosis not present

## 2016-08-01 DIAGNOSIS — M450 Ankylosing spondylitis of multiple sites in spine: Secondary | ICD-10-CM | POA: Diagnosis not present

## 2016-09-04 DIAGNOSIS — C4499 Other specified malignant neoplasm of skin, unspecified: Secondary | ICD-10-CM | POA: Diagnosis not present

## 2016-09-05 DIAGNOSIS — M8580 Other specified disorders of bone density and structure, unspecified site: Secondary | ICD-10-CM | POA: Diagnosis not present

## 2016-09-05 DIAGNOSIS — M5136 Other intervertebral disc degeneration, lumbar region: Secondary | ICD-10-CM | POA: Diagnosis not present

## 2016-09-05 DIAGNOSIS — M450 Ankylosing spondylitis of multiple sites in spine: Secondary | ICD-10-CM | POA: Diagnosis not present

## 2016-09-05 DIAGNOSIS — M797 Fibromyalgia: Secondary | ICD-10-CM | POA: Diagnosis not present

## 2016-09-26 DIAGNOSIS — M450 Ankylosing spondylitis of multiple sites in spine: Secondary | ICD-10-CM | POA: Diagnosis not present

## 2016-11-07 DIAGNOSIS — M450 Ankylosing spondylitis of multiple sites in spine: Secondary | ICD-10-CM | POA: Diagnosis not present

## 2016-12-06 DIAGNOSIS — M8580 Other specified disorders of bone density and structure, unspecified site: Secondary | ICD-10-CM | POA: Diagnosis not present

## 2016-12-06 DIAGNOSIS — M450 Ankylosing spondylitis of multiple sites in spine: Secondary | ICD-10-CM | POA: Diagnosis not present

## 2016-12-06 DIAGNOSIS — M5136 Other intervertebral disc degeneration, lumbar region: Secondary | ICD-10-CM | POA: Diagnosis not present

## 2016-12-19 DIAGNOSIS — M450 Ankylosing spondylitis of multiple sites in spine: Secondary | ICD-10-CM | POA: Diagnosis not present

## 2016-12-19 DIAGNOSIS — Z79899 Other long term (current) drug therapy: Secondary | ICD-10-CM | POA: Diagnosis not present

## 2017-01-11 ENCOUNTER — Other Ambulatory Visit: Payer: Self-pay | Admitting: Physician Assistant

## 2017-01-11 DIAGNOSIS — R945 Abnormal results of liver function studies: Secondary | ICD-10-CM | POA: Diagnosis not present

## 2017-01-11 DIAGNOSIS — R7989 Other specified abnormal findings of blood chemistry: Secondary | ICD-10-CM

## 2017-01-16 DIAGNOSIS — Z859 Personal history of malignant neoplasm, unspecified: Secondary | ICD-10-CM | POA: Diagnosis not present

## 2017-01-16 DIAGNOSIS — Z9889 Other specified postprocedural states: Secondary | ICD-10-CM | POA: Diagnosis not present

## 2017-01-16 DIAGNOSIS — C4499 Other specified malignant neoplasm of skin, unspecified: Secondary | ICD-10-CM | POA: Diagnosis not present

## 2017-01-18 ENCOUNTER — Ambulatory Visit
Admission: RE | Admit: 2017-01-18 | Discharge: 2017-01-18 | Disposition: A | Discharge status: Home / Self Care | Payer: BLUE CROSS/BLUE SHIELD | Source: Ambulatory Visit | Attending: Physician Assistant | Admitting: Physician Assistant

## 2017-01-18 DIAGNOSIS — R945 Abnormal results of liver function studies: Principal | ICD-10-CM

## 2017-01-18 DIAGNOSIS — R7989 Other specified abnormal findings of blood chemistry: Secondary | ICD-10-CM

## 2017-01-30 DIAGNOSIS — M450 Ankylosing spondylitis of multiple sites in spine: Secondary | ICD-10-CM | POA: Diagnosis not present

## 2017-02-18 DIAGNOSIS — K760 Fatty (change of) liver, not elsewhere classified: Secondary | ICD-10-CM | POA: Diagnosis not present

## 2017-02-18 DIAGNOSIS — R945 Abnormal results of liver function studies: Secondary | ICD-10-CM | POA: Diagnosis not present

## 2017-03-07 DIAGNOSIS — M450 Ankylosing spondylitis of multiple sites in spine: Secondary | ICD-10-CM | POA: Diagnosis not present

## 2017-03-07 DIAGNOSIS — M5136 Other intervertebral disc degeneration, lumbar region: Secondary | ICD-10-CM | POA: Diagnosis not present

## 2017-03-07 DIAGNOSIS — M797 Fibromyalgia: Secondary | ICD-10-CM | POA: Diagnosis not present

## 2017-03-13 DIAGNOSIS — M450 Ankylosing spondylitis of multiple sites in spine: Secondary | ICD-10-CM | POA: Diagnosis not present

## 2017-03-20 DIAGNOSIS — R945 Abnormal results of liver function studies: Secondary | ICD-10-CM | POA: Diagnosis not present

## 2017-03-21 DIAGNOSIS — Z862 Personal history of diseases of the blood and blood-forming organs and certain disorders involving the immune mechanism: Secondary | ICD-10-CM | POA: Diagnosis not present

## 2017-04-24 DIAGNOSIS — M450 Ankylosing spondylitis of multiple sites in spine: Secondary | ICD-10-CM | POA: Diagnosis not present

## 2017-05-20 DIAGNOSIS — K760 Fatty (change of) liver, not elsewhere classified: Secondary | ICD-10-CM | POA: Diagnosis not present

## 2017-05-20 DIAGNOSIS — R945 Abnormal results of liver function studies: Secondary | ICD-10-CM | POA: Diagnosis not present

## 2017-05-20 DIAGNOSIS — K5100 Ulcerative (chronic) pancolitis without complications: Secondary | ICD-10-CM | POA: Diagnosis not present

## 2017-05-20 DIAGNOSIS — Z862 Personal history of diseases of the blood and blood-forming organs and certain disorders involving the immune mechanism: Secondary | ICD-10-CM | POA: Diagnosis not present

## 2017-06-06 DIAGNOSIS — M450 Ankylosing spondylitis of multiple sites in spine: Secondary | ICD-10-CM | POA: Diagnosis not present

## 2017-06-06 DIAGNOSIS — M5136 Other intervertebral disc degeneration, lumbar region: Secondary | ICD-10-CM | POA: Diagnosis not present

## 2017-06-06 DIAGNOSIS — M8580 Other specified disorders of bone density and structure, unspecified site: Secondary | ICD-10-CM | POA: Diagnosis not present

## 2017-06-06 DIAGNOSIS — M797 Fibromyalgia: Secondary | ICD-10-CM | POA: Diagnosis not present

## 2017-06-20 DIAGNOSIS — Z79899 Other long term (current) drug therapy: Secondary | ICD-10-CM | POA: Diagnosis not present

## 2017-06-20 DIAGNOSIS — M450 Ankylosing spondylitis of multiple sites in spine: Secondary | ICD-10-CM | POA: Diagnosis not present

## 2017-06-25 NOTE — Progress Notes (Signed)
Cardiology Office Note   Date:  06/26/2017   ID:  [name], DOB 29-Aug-1960, MRN [id number]  PCP:  Maury Dus, MD    No chief complaint on file.  CAD  Wt Readings from Last 3 Encounters:  06/26/17 205 lb (93 kg)  01/29/14 185 lb 1.9 oz (84 kg)  12/30/12 196 lb (88.9 kg)       History of Present Illness: [name] is a 57 y.o. male  who had an LAD stent in 2008.   His angina was an exertional chest tightness.  It occurred with mowing the lawn.  It would stop with rest.  Sx resolved after the stent.  Of note, he was seen at [facility] ER and had a negative w/u just before his cardiac w/u with me.   He has had some issues with fatty liver.  His atorvastatin was stopped due to increased LFTs.  He has restarted a lower dose.  Over the past few weeks, he has noticed a fast heart beat at night.  It has been 4-5 x/week.  It feels like an anxiety.  It has not been irregular.  It may last 2-3 minutes.  No diaphoresis.  This has been different than his prior angina.    Denies : Chest pain. Dizziness. Leg edema. Nitroglycerin use. Orthopnea.  Paroxysmal nocturnal dyspnea. Shortness of breath. Syncope.   He walks his dog twice a day.     Past Medical History:  Diagnosis Date  . Anemia   . Ankylosing spondylitis ([place])   . Arthritis   . Asthma    SINCE AGE 44  . Colitis, ulcerative ([place])   . Coronary artery disease    Bare metal LAD stent 10/08  . ED (erectile dysfunction)   . Fibromyalgia   . GERD (gastroesophageal reflux disease)   . Hyperlipidemia   . Pneumonia    as a child  . TMJ (dislocation of temporomandibular joint)     Past Surgical History:  Procedure Laterality Date  . COLONOSCOPY    . EYE SURGERY Bilateral   . heart stent    . skin cancer removed from left eyelid     . skull fracture after falling down stairs    . TONSILLECTOMY       Current Outpatient Medications  Medication Sig Dispense Refill  . albuterol (PROVENTIL HFA;VENTOLIN HFA) 108  (90 BASE) MCG/ACT inhaler Inhale 2 puffs into the lungs every 6 (six) hours as needed for wheezing.    Marland Kitchen aspirin 81 MG tablet Take 81 mg by mouth daily.    Marland Kitchen atorvastatin (LIPITOR) 80 MG tablet Take 1 tablet (80 mg total) by mouth daily at 6 PM. 90 tablet 3  . cyclobenzaprine (FLEXERIL) 5 MG tablet Take 5 mg by mouth at bedtime.     . DOCOSAHEXAENOIC ACID PO Take 1 g by mouth 2 (two) times daily.    . ferrous sulfate 325 (65 FE) MG EC tablet Take 325 mg by mouth 2 (two) times daily.    . fish oil-omega-3 fatty acids 1000 MG capsule Take 2 g by mouth 2 (two) times daily.    Marland Kitchen gabapentin (NEURONTIN) 100 MG capsule Take 1 capsule by mouth daily.    Marland Kitchen HYDROcodone-acetaminophen (NORCO) 10-325 MG per tablet Take 1-2 tablets by mouth daily.   0  . loratadine (CLARITIN) 10 MG tablet Take 10 mg by mouth daily.    Marland Kitchen LYRICA 150 MG capsule Take 150 mg by mouth 2 (two) times  daily.     . Multiple Vitamins-Minerals (CENTRUM CARDIO) TABS Take by mouth 2 (two) times daily.    . nitroGLYCERIN (NITROSTAT) 0.4 MG SL tablet Place 1 tablet (0.4 mg total) under the tongue every 5 (five) minutes as needed for chest pain. 25 tablet 6  . omeprazole (PRILOSEC) 20 MG capsule Take 20 mg by mouth every other day.    . predniSONE (DELTASONE) 5 MG tablet Take 5 mg by mouth daily.    Marland Kitchen sulfaSALAzine (AZULFIDINE) 500 MG EC tablet Take 500 mg by mouth. 2 TABLETS BID    . theophylline (THEODUR) 300 MG 12 hr tablet Take 300 mg by mouth 2 (two) times daily.     No current facility-administered medications for this visit.     Allergies:   Patient has no known allergies.    Social History:  The patient  reports that he has never smoked. He has quit using smokeless tobacco. He reports that he drinks alcohol.   Family History:  The patient's family history includes COPD in his maternal grandfather; Emphysema in his maternal grandfather; GER disease in his sister; Hypertension in his mother; Lung cancer in his father.    ROS:   Please see the history of present illness.   Otherwise, review of systems are positive for palpitations.   All other systems are reviewed and negative.    PHYSICAL EXAM: VS:  BP 128/80 (BP Location: Right Arm, Patient Position: Sitting, Cuff Size: Normal)   Pulse (!) 106   Ht 5\' 8"  (1.727 m)   Wt 205 lb (93 kg)   SpO2 94%   BMI 31.17 kg/m  , BMI Body mass index is 31.17 kg/m. GEN: Well nourished, well developed, in no acute distress  HEENT: normal  Neck: no JVD, carotid bruits, or masses Cardiac: RRR; no murmurs, rubs, or gallops,no edema  Respiratory:  clear to auscultation bilaterally, normal work of breathing GI: soft, nontender, nondistended, + BS; small umbilical hernia MS: no deformity or atrophy  Skin: warm and dry, no rash Neuro:  Strength and sensation are intact Psych: euthymic mood, full affect   EKG:   The ekg ordered today demonstrates sinus tachycardia, no ST segment changes   Recent Labs: No results found for requested labs within last 8760 hours.   Lipid Panel    Component Value Date/Time   CHOL 100 02/04/2014 1010   TRIG 103.0 02/04/2014 1010   HDL 31.10 (L) 02/04/2014 1010   CHOLHDL 3 02/04/2014 1010   VLDL 20.6 02/04/2014 1010   LDLCALC 48 02/04/2014 1010     Other studies Reviewed: Additional studies/ records that were reviewed today with results demonstrating: labs reviewed.   ASSESSMENT AND PLAN:  1. CAD: No angina.  Continue aggressive secondary prevention.  I asked him to increase his exercise as well.  Should try to eat healthy.  Heart healthy diet will also be good for his fatty liver issues.  Minimize sugar intake. 2. Palpitations: Sounds like he may be having some sinus tachycardia, which she had on his ECG today.  His heart rate did slow down at the end of the appointment.  We will plan for 24-hour Holter monitor.  If he does not have symptoms while wearing the Holter, could change to a 30-day event monitor. 3. Hyperlipidemia: Stop  atorvastatin.  Start Crestor 10 mg daily.  Check lipids and liver tests in 3 months.  He has reduced alcohol due to the liver function tests; he only dinks on the weekends 4.  Obesity: 20 lb weight gain since 2014.  Healthy diet recommended.   Current medicines are reviewed at length with the patient today.  The patient concerns regarding his medicines were addressed.  The following changes have been made: Start Crestor, stop atorvastatin  Labs/ tests ordered today include: Lipids as above No orders of the defined types were placed in this encounter.   Recommend 150 minutes/week of aerobic exercise Low fat, low carb, high fiber diet recommended  Disposition:   FU in 3 months   Signed, Larae Grooms, MD  06/26/2017 3:14 PM    [facility] [address], [place], [place]  [address] Phone: [phone]; Fax: [phone]

## 2017-06-26 ENCOUNTER — Ambulatory Visit (INDEPENDENT_AMBULATORY_CARE_PROVIDER_SITE_OTHER): Payer: BLUE CROSS/BLUE SHIELD | Admitting: Interventional Cardiology

## 2017-06-26 ENCOUNTER — Encounter: Payer: Self-pay | Admitting: Interventional Cardiology

## 2017-06-26 VITALS — BP 128/80 | HR 106 | Ht 68.0 in | Wt 205.0 lb

## 2017-06-26 DIAGNOSIS — E669 Obesity, unspecified: Secondary | ICD-10-CM

## 2017-06-26 DIAGNOSIS — E782 Mixed hyperlipidemia: Secondary | ICD-10-CM | POA: Diagnosis not present

## 2017-06-26 DIAGNOSIS — R002 Palpitations: Secondary | ICD-10-CM | POA: Diagnosis not present

## 2017-06-26 DIAGNOSIS — I25118 Atherosclerotic heart disease of native coronary artery with other forms of angina pectoris: Secondary | ICD-10-CM

## 2017-06-26 MED ORDER — ROSUVASTATIN CALCIUM 10 MG PO TABS: 10.0000 mg | ORAL_TABLET | Freq: Every day | ORAL | 3 refills | Status: DC

## 2017-06-26 NOTE — Patient Instructions (Signed)
Medication Instructions:  Your physician has recommended you make the following change in your medication:   1. STOP: atorvastatin (lipitor)  2. START: rosuvastatin (crestor) 10 mg tablet- Take 1 tablet once daily  Labwork: Your physician recommends that you return for a FASTING lipid profile and liver function panel in 3 months  Testing/Procedures: Your physician has recommended that you wear a 24 hour holter monitor. Holter monitors are medical devices that record the heart's electrical activity. Doctors most often use these monitors to diagnose arrhythmias. Arrhythmias are problems with the speed or rhythm of the heartbeat. The monitor is a small, portable device. You can wear one while you do your normal daily activities. This is usually used to diagnose what is causing palpitations/syncope (passing out).  Follow-Up: Your physician recommends that you schedule a follow-up appointment in: 4 months with Dr. Irish Lack.   Any Other Special Instructions Will Be Listed Below (If Applicable).   Holter Monitoring A Holter monitor is a small device that is used to detect abnormal heart rhythms. It clips to your clothing and is connected by wires to flat, sticky disks (electrodes) that attach to your chest. It is worn continuously for 24-48 hours. Follow these instructions at home:  Wear your Holter monitor at all times, even while exercising and sleeping, for as long as directed by your health care provider.  Make sure that the Holter monitor is safely clipped to your clothing or close to your body as recommended by your health care provider.  Do not get the monitor or wires wet.  Do not put body lotion or moisturizer on your chest.  Keep your skin clean.  Keep a diary of your daily activities, such as walking and doing chores. If you feel that your heartbeat is abnormal or that your heart is fluttering or skipping a beat: ? Record what you are doing when it happens. ? Record what time  of day the symptoms occur.  Return your Holter monitor as directed by your health care provider.  Keep all follow-up visits as directed by your health care provider. This is important. Get help right away if:  You feel lightheaded or you faint.  You have trouble breathing.  You feel pain in your chest, upper arm, or jaw.  You feel sick to your stomach and your skin is pale, cool, or damp.  You heartbeat feels unusual or abnormal. This information is not intended to replace advice given to you by your health care provider. Make sure you discuss any questions you have with your health care provider. Document Released: 12/02/2003 Document Revised: 08/11/2015 Document Reviewed: 10/12/2013 Elsevier Interactive Patient Education  [employer].    If you need a refill on your cardiac medications before your next appointment, please call your pharmacy.

## 2017-07-08 ENCOUNTER — Other Ambulatory Visit: Payer: Self-pay | Admitting: Interventional Cardiology

## 2017-07-08 ENCOUNTER — Ambulatory Visit (INDEPENDENT_AMBULATORY_CARE_PROVIDER_SITE_OTHER): Payer: BLUE CROSS/BLUE SHIELD

## 2017-07-08 DIAGNOSIS — R000 Tachycardia, unspecified: Secondary | ICD-10-CM

## 2017-07-08 DIAGNOSIS — R002 Palpitations: Secondary | ICD-10-CM | POA: Diagnosis not present

## 2017-07-08 DIAGNOSIS — R0602 Shortness of breath: Secondary | ICD-10-CM | POA: Diagnosis not present

## 2017-07-23 ENCOUNTER — Telehealth: Payer: Self-pay | Admitting: Interventional Cardiology

## 2017-07-23 NOTE — Telephone Encounter (Signed)
New Message:     Pt is returning a call for labs and pt states pls leave results on vm.

## 2017-07-23 NOTE — Telephone Encounter (Signed)
Left detailed message of results on VM (per patient's request). Instructed for patient to call back with any questions.

## 2017-07-23 NOTE — Telephone Encounter (Signed)
-----   Message from Jettie Booze, MD sent at 07/22/2017  2:42 PM EDT ----- As noted in results section.

## 2017-07-23 NOTE — Telephone Encounter (Signed)
Narrative & Impression      Normal sinus rhythm with occasional PACs and PVCs.  Short runs of SVT, less than 5 beats typically.  No sustained arrhtyhmias.   Continue medical therapy.  If symptoms worsen, could increase beta blocker.

## 2017-07-24 DIAGNOSIS — R972 Elevated prostate specific antigen [PSA]: Secondary | ICD-10-CM | POA: Diagnosis not present

## 2017-07-24 DIAGNOSIS — J45909 Unspecified asthma, uncomplicated: Secondary | ICD-10-CM | POA: Diagnosis not present

## 2017-07-24 DIAGNOSIS — E7800 Pure hypercholesterolemia, unspecified: Secondary | ICD-10-CM | POA: Diagnosis not present

## 2017-07-24 DIAGNOSIS — Z0000 Encounter for general adult medical examination without abnormal findings: Secondary | ICD-10-CM | POA: Diagnosis not present

## 2017-08-01 DIAGNOSIS — M450 Ankylosing spondylitis of multiple sites in spine: Secondary | ICD-10-CM | POA: Diagnosis not present

## 2017-09-06 DIAGNOSIS — M450 Ankylosing spondylitis of multiple sites in spine: Secondary | ICD-10-CM | POA: Diagnosis not present

## 2017-09-06 DIAGNOSIS — M5136 Other intervertebral disc degeneration, lumbar region: Secondary | ICD-10-CM | POA: Diagnosis not present

## 2017-09-06 DIAGNOSIS — M797 Fibromyalgia: Secondary | ICD-10-CM | POA: Diagnosis not present

## 2017-09-06 DIAGNOSIS — M8580 Other specified disorders of bone density and structure, unspecified site: Secondary | ICD-10-CM | POA: Diagnosis not present

## 2017-09-12 DIAGNOSIS — M450 Ankylosing spondylitis of multiple sites in spine: Secondary | ICD-10-CM | POA: Diagnosis not present

## 2017-09-25 ENCOUNTER — Other Ambulatory Visit: Payer: BLUE CROSS/BLUE SHIELD

## 2017-09-25 DIAGNOSIS — E782 Mixed hyperlipidemia: Secondary | ICD-10-CM

## 2017-09-25 DIAGNOSIS — I25118 Atherosclerotic heart disease of native coronary artery with other forms of angina pectoris: Secondary | ICD-10-CM

## 2017-09-25 LAB — HEPATIC FUNCTION PANEL
ALBUMIN: 4.6 g/dL (ref 3.5–5.5)
ALK PHOS: 92 IU/L (ref 39–117)
ALT: 143 IU/L — ABNORMAL HIGH (ref 0–44)
AST: 72 IU/L — AB (ref 0–40)
BILIRUBIN TOTAL: 0.7 mg/dL (ref 0.0–1.2)
Bilirubin, Direct: 0.24 mg/dL (ref 0.00–0.40)
TOTAL PROTEIN: 7.2 g/dL (ref 6.0–8.5)

## 2017-09-25 LAB — LIPID PANEL
CHOL/HDL RATIO: 2.8 ratio (ref 0.0–5.0)
Cholesterol, Total: 128 mg/dL (ref 100–199)
HDL: 45 mg/dL (ref >39)
LDL CALC: 64 mg/dL (ref 0–99)
TRIGLYCERIDES: 97 mg/dL (ref 0–149)
VLDL Cholesterol Cal: 19 mg/dL (ref 5–40)

## 2017-10-24 DIAGNOSIS — M450 Ankylosing spondylitis of multiple sites in spine: Secondary | ICD-10-CM | POA: Diagnosis not present

## 2017-11-05 ENCOUNTER — Telehealth: Payer: Self-pay | Admitting: Interventional Cardiology

## 2017-11-05 NOTE — Telephone Encounter (Signed)
New Message    Pt is returning call for nurse, he is requesting that a detailed message be left because he does not have time to wait around for [place] to call

## 2017-11-05 NOTE — Telephone Encounter (Signed)
Patient has not received a call from the RN. No recent testing or labs. After further research, the patient was contacted by the scheduler to confirm appointment tomorrow.   Left detailed message on patient's VM (per patient request) letting him know that the scheduler was calling to confirm his appointment scheduled for tomorrow with Dr. Irish Lack at 2:00 PM. Instructed for patient to call back and re-schedule if he was unable to keep his appointment.

## 2017-11-05 NOTE — Progress Notes (Signed)
Cardiology Office Note   Date:  11/06/2017   ID:  [name], DOB 31-May-1960, MRN [id number]  PCP:  Harold Dus, MD    No chief complaint on file.  CAD  Wt Readings from Last 3 Encounters:  11/06/17 206 lb 6.4 oz (93.6 kg)  06/26/17 205 lb (93 kg)  01/29/14 185 lb 1.9 oz (84 kg)       History of Present Illness: [name] is a 57 y.o. male  who had an LAD stent in 2008.   His angina was an exertional chest tightness.  It occurred with mowing the lawn.  It would stop with rest.  Sx resolved after the stent.  Of note, he was seen at [facility] ER and had a negative w/u just before his cardiac w/u with me.   He has had some issues with fatty liver.  His atorvastatin was stopped due to increased LFTs.  He was started on Crestor in 4/19.  He has had some palpitations earlier in 2019.  Holter monitor at that time showed:  Normal sinus rhythm with occasional PACs and PVCs.  Short runs of SVT, less than 5 beats typically.  No sustained arrhtyhmias.   Continue medical therapy.  If symptoms worsen, could increase beta blocker.   Since the last visit, no further CP or palpitations.  He is walking regularly, several times a week, 20-40 minutes at a time.    Denies : Chest pain. Dizziness. Leg edema. Nitroglycerin use. Orthopnea. Palpitations. Paroxysmal nocturnal dyspnea. Shortness of breath. Syncope.   Crestor was stopped in July 2019 due to increased LFTs.  He has f/u with [facility] in September.     Past Medical History:  Diagnosis Date  . Anemia   . Ankylosing spondylitis ([place])   . Arthritis   . Asthma    SINCE AGE 57  . Colitis, ulcerative ([place])   . Coronary artery disease    Bare metal LAD stent 10/08  . Coronary atherosclerosis of native coronary artery 12/25/2012  . ED (erectile dysfunction)   . Elevated prostate specific antigen (PSA) 12/25/2012  . Esophageal reflux 12/25/2012  . Extrinsic asthma, unspecified 12/25/2012  . Fibromyalgia   . GERD  (gastroesophageal reflux disease)   . Hypercalcemia 12/25/2012  . Hyperlipidemia   . Left sided ulcerative (chronic) colitis ([place]) 12/25/2012  . Mixed hyperlipidemia 12/25/2012  . Pneumonia    as a child  . Pure hypercholesterolemia 12/25/2012  . TMJ (dislocation of temporomandibular joint)     Past Surgical History:  Procedure Laterality Date  . COLONOSCOPY    . EYE SURGERY Bilateral   . heart stent    . skin cancer removed from left eyelid     . skull fracture after falling down stairs    . TONSILLECTOMY       Current Outpatient Medications  Medication Sig Dispense Refill  . albuterol (PROVENTIL HFA;VENTOLIN HFA) 108 (90 BASE) MCG/ACT inhaler Inhale 2 puffs into the lungs every 6 (six) hours as needed for wheezing.    Marland Kitchen aspirin 81 MG tablet Take 81 mg by mouth daily.    . cyclobenzaprine (FLEXERIL) 5 MG tablet Take 5 mg by mouth at bedtime.     . DOCOSAHEXAENOIC ACID PO Take 1 g by mouth 2 (two) times daily.    . ferrous sulfate 325 (65 FE) MG EC tablet Take 325 mg by mouth 2 (two) times daily.    . fish oil-omega-3 fatty acids 1000 MG capsule Take 2  g by mouth 2 (two) times daily.    Marland Kitchen gabapentin (NEURONTIN) 100 MG capsule Take 1 capsule by mouth daily.    Marland Kitchen HYDROcodone-acetaminophen (NORCO) 10-325 MG per tablet Take 1-2 tablets by mouth daily.   0  . loratadine (CLARITIN) 10 MG tablet Take 10 mg by mouth daily.    Marland Kitchen LYRICA 150 MG capsule Take 150 mg by mouth 2 (two) times daily.     . Multiple Vitamins-Minerals (CENTRUM CARDIO) TABS Take by mouth 2 (two) times daily.    . nitroGLYCERIN (NITROSTAT) 0.4 MG SL tablet Place 1 tablet (0.4 mg total) under the tongue every 5 (five) minutes as needed for chest pain. 25 tablet 6  . omeprazole (PRILOSEC) 20 MG capsule Take 20 mg by mouth every other day.    . predniSONE (DELTASONE) 5 MG tablet Take 5 mg by mouth daily.    . rosuvastatin (CRESTOR) 10 MG tablet Take 1 tablet (10 mg total) by mouth daily. 90 tablet 3  . sulfaSALAzine  (AZULFIDINE) 500 MG EC tablet Take 500 mg by mouth. 2 TABLETS BID    . theophylline (THEODUR) 300 MG 12 hr tablet Take 300 mg by mouth 2 (two) times daily.     No current facility-administered medications for this visit.     Allergies:   Patient has no known allergies.    Social History:  The patient  reports that he has never smoked. He has quit using smokeless tobacco. He reports that he drinks alcohol.   Family History:  The patient's family history includes COPD in his maternal grandfather; Emphysema in his maternal grandfather; GER disease in his sister; Hypertension in his mother; Lung cancer in his father.    ROS:  Please see the history of present illness.   Otherwise, review of systems are positive for GI issues fom [facility].   All other systems are reviewed and negative.    PHYSICAL EXAM: VS:  BP 124/80   Pulse (!) 103   Ht 5\' 8"  (1.727 m)   Wt 206 lb 6.4 oz (93.6 kg)   SpO2 93%   BMI 31.38 kg/m  , BMI Body mass index is 31.38 kg/m. GEN: Well nourished, well developed, in no acute distress  HEENT: normal  Neck: no JVD, carotid bruits, or masses Cardiac: RRR; no murmurs, rubs, or gallops,no edema  Respiratory:  clear to auscultation bilaterally, normal work of breathing GI: soft, nontender, nondistended, + BS MS: no deformity or atrophy  Skin: warm and dry, no rash Neuro:  Strength and sensation are intact Psych: euthymic mood, full affect    Recent Labs: 09/25/2017: ALT 143   Lipid Panel    Component Value Date/Time   CHOL 128 09/25/2017 0859   TRIG 97 09/25/2017 0859   HDL 45 09/25/2017 0859   CHOLHDL 2.8 09/25/2017 0859   CHOLHDL 3 02/04/2014 1010   VLDL 20.6 02/04/2014 1010   LDLCALC 64 09/25/2017 0859     Other studies Reviewed: Additional studies/ records that were reviewed today with results demonstrating: liver tests reviewed; DL 64 in 7/19.   ASSESSMENT AND PLAN:  1. CAD: No angina.  COntinue aggressive secondary prevention.  May need another  agent to lower lipids.  S/p PCI to LAD in 2008. 2. Palpitations: No further sx.  Benign monitor.   3. Hyperlipidemia: Will check if he is a candidate for PCSK9 inhibitor.  Will have to check LFTs and lipids after Crestor has washed out. 4. Obesity: Trying to eat healthy.  Exercise  limited by arthritis but noted as above. He has decreased alcohol intake.   5. LFTs increased: f/u with [facility].     Current medicines are reviewed at length with the patient today.  The patient concerns regarding his medicines were addressed.  The following changes have been made:  No change  Labs/ tests ordered today include:  No orders of the defined types were placed in this encounter.   Recommend 150 minutes/week of aerobic exercise Low fat, low carb, high fiber diet recommended  Disposition:   FU in 1 year   Signed, Larae Grooms, MD  11/06/2017 2:36 PM    [facility] [address], [place], [place]  [address] Phone: [phone]; Fax: [phone]

## 2017-11-06 ENCOUNTER — Ambulatory Visit (INDEPENDENT_AMBULATORY_CARE_PROVIDER_SITE_OTHER): Payer: BLUE CROSS/BLUE SHIELD | Admitting: Interventional Cardiology

## 2017-11-06 ENCOUNTER — Encounter (INDEPENDENT_AMBULATORY_CARE_PROVIDER_SITE_OTHER): Payer: Self-pay

## 2017-11-06 ENCOUNTER — Encounter: Payer: Self-pay | Admitting: Interventional Cardiology

## 2017-11-06 VITALS — BP 124/80 | HR 103 | Ht 68.0 in | Wt 206.4 lb

## 2017-11-06 DIAGNOSIS — R7989 Other specified abnormal findings of blood chemistry: Secondary | ICD-10-CM

## 2017-11-06 DIAGNOSIS — N400 Enlarged prostate without lower urinary tract symptoms: Secondary | ICD-10-CM | POA: Diagnosis not present

## 2017-11-06 DIAGNOSIS — E782 Mixed hyperlipidemia: Secondary | ICD-10-CM

## 2017-11-06 DIAGNOSIS — E669 Obesity, unspecified: Secondary | ICD-10-CM | POA: Diagnosis not present

## 2017-11-06 DIAGNOSIS — R945 Abnormal results of liver function studies: Secondary | ICD-10-CM

## 2017-11-06 DIAGNOSIS — I25118 Atherosclerotic heart disease of native coronary artery with other forms of angina pectoris: Secondary | ICD-10-CM

## 2017-11-06 DIAGNOSIS — R002 Palpitations: Secondary | ICD-10-CM | POA: Diagnosis not present

## 2017-11-06 DIAGNOSIS — R972 Elevated prostate specific antigen [PSA]: Secondary | ICD-10-CM | POA: Diagnosis not present

## 2017-11-06 NOTE — Patient Instructions (Signed)

## 2017-11-07 ENCOUNTER — Telehealth: Payer: Self-pay

## 2017-11-07 NOTE — Telephone Encounter (Signed)
-----   Message from Erskine Emery, [facility] sent at 11/06/2017  9:13 PM EDT ----- We will need to repeat lipids (would recheck in 2-4 weeks since he has been off statin for about 1 month now). If LDL above 70 he will qualify. Thanks! Georgina Peer   ----- Message ----- From: Jettie Booze, MD Sent: 11/06/2017   2:52 PM EDT To: Erskine Emery, [facility]  Georgina Peer, WOuld he be a candidate or PCSK9 inhibitor.  He has had increased LFTs with atorva and Crestor. LDL was controlled on statin.  May need recheck off of statin.

## 2017-11-07 NOTE — Telephone Encounter (Signed)
Patient is scheduled to see Deliah Goody, PA at [facility] on 9/9 who is managing his elevated LFTs. He is supposed to have LFTs rechecked at that appointment. Will call and see if he can have fasting lipids done at the same time.   Called and spoke to Ashok Croon Garrett's RN and asked her if they would be able to add fasting lipids to his lab draw on 9/9. Janett Billow states that she will discuss with Otila Kluver and call me back tomorrow.

## 2017-11-11 ENCOUNTER — Telehealth: Payer: Self-pay | Admitting: Interventional Cardiology

## 2017-11-11 NOTE — Telephone Encounter (Signed)
Spoke with Casper Harrison Garrett's RN at [facility]. They will add on fasting LIPIDS to the patient's LFTS. The appointment has been re-scheduled for 9/4.

## 2017-11-11 NOTE — Telephone Encounter (Signed)
Called to follow up with Ashok Croon Garrett's RN and see if they would be able to add fasting lipids to his lab draw on 9/9. Left message for her to call back.

## 2017-11-11 NOTE — Telephone Encounter (Signed)
See telephone encounter 8/22.

## 2017-11-11 NOTE — Telephone Encounter (Signed)
Left message for Jessica to call back.  

## 2017-11-11 NOTE — Telephone Encounter (Signed)
New Message:   Janett Billow from [facility] is requesting a call back

## 2017-11-14 DIAGNOSIS — R972 Elevated prostate specific antigen [PSA]: Secondary | ICD-10-CM | POA: Diagnosis not present

## 2017-11-20 DIAGNOSIS — K760 Fatty (change of) liver, not elsewhere classified: Secondary | ICD-10-CM | POA: Diagnosis not present

## 2017-11-20 DIAGNOSIS — Z86010 Personal history of colonic polyps: Secondary | ICD-10-CM | POA: Diagnosis not present

## 2017-11-20 DIAGNOSIS — R945 Abnormal results of liver function studies: Secondary | ICD-10-CM | POA: Diagnosis not present

## 2017-11-20 DIAGNOSIS — E782 Mixed hyperlipidemia: Secondary | ICD-10-CM | POA: Diagnosis not present

## 2017-12-04 ENCOUNTER — Telehealth: Payer: Self-pay | Admitting: Pharmacist

## 2017-12-04 MED ORDER — EVOLOCUMAB 140 MG/ML ~~LOC~~ SOAJ: 140.0000 mg | SUBCUTANEOUS | 11 refills | Status: DC

## 2017-12-04 NOTE — Telephone Encounter (Signed)
Spoke with patient. He is comfortable doing injection at home. He is aware to activate copay card. He will call with any questions or concerns. He will call once he has started the medication so that labs can be ordered.

## 2017-12-04 NOTE — Telephone Encounter (Signed)
Pt approved for Repatha through insurance.   Pt insurance requires to fill through [facility]. RX has been sent.   LMOM to discuss with patient.

## 2017-12-04 NOTE — Telephone Encounter (Signed)
-----   Message from Jettie Booze, MD sent at 11/27/2017  6:52 PM EDT ----- Lipid clinic

## 2017-12-09 DIAGNOSIS — M450 Ankylosing spondylitis of multiple sites in spine: Secondary | ICD-10-CM | POA: Diagnosis not present

## 2018-01-02 NOTE — Telephone Encounter (Signed)
LMOM for pt to see if he has started Repatha shots yet, will need labs scheduled.

## 2018-01-20 DIAGNOSIS — M450 Ankylosing spondylitis of multiple sites in spine: Secondary | ICD-10-CM | POA: Diagnosis not present

## 2018-01-20 DIAGNOSIS — Z79899 Other long term (current) drug therapy: Secondary | ICD-10-CM | POA: Diagnosis not present

## 2018-02-06 ENCOUNTER — Telehealth: Payer: Self-pay

## 2018-02-06 DIAGNOSIS — E7800 Pure hypercholesterolemia, unspecified: Secondary | ICD-10-CM

## 2018-02-10 NOTE — Telephone Encounter (Signed)
Scheduled labs for repatha

## 2018-02-18 DIAGNOSIS — K760 Fatty (change of) liver, not elsewhere classified: Secondary | ICD-10-CM | POA: Diagnosis not present

## 2018-02-18 DIAGNOSIS — R945 Abnormal results of liver function studies: Secondary | ICD-10-CM | POA: Diagnosis not present

## 2018-02-18 DIAGNOSIS — E782 Mixed hyperlipidemia: Secondary | ICD-10-CM | POA: Diagnosis not present

## 2018-02-18 DIAGNOSIS — Z8719 Personal history of other diseases of the digestive system: Secondary | ICD-10-CM | POA: Diagnosis not present

## 2018-02-25 ENCOUNTER — Other Ambulatory Visit: Payer: BLUE CROSS/BLUE SHIELD

## 2018-02-27 ENCOUNTER — Telehealth: Payer: Self-pay

## 2018-02-27 DIAGNOSIS — E782 Mixed hyperlipidemia: Secondary | ICD-10-CM

## 2018-02-27 DIAGNOSIS — E7800 Pure hypercholesterolemia, unspecified: Secondary | ICD-10-CM

## 2018-02-27 NOTE — Telephone Encounter (Signed)
Left message for patient to call back  

## 2018-02-27 NOTE — Telephone Encounter (Signed)
-----   Message from Erskine Emery, [facility] sent at 02/25/2018 10:53 PM EST ----- Based on Chart pt is taking OTC fish oil. Would recommend changing to Vascepa 2g BID. Fish oil can rarely effect LFT, but much less common than starting a fibrate or increasing statin therapy. Would repeat lipid and hepatic in 2 months.   ----- Message ----- From: Jettie Booze, MD Sent: 02/25/2018   9:33 AM EST To: Leeroy Bock, RPH, Cleon Gustin, RN  He needs TG lowering med that won't affect LFTs. THanks.

## 2018-02-28 NOTE — Telephone Encounter (Signed)
Left message for patient to call back for instructions for switching medicines.

## 2018-02-28 NOTE — Telephone Encounter (Signed)
  [name] is returning your call and said it is okay to leave a message

## 2018-02-28 NOTE — Telephone Encounter (Signed)
Sorry I should have realized this previously and commented, but did he start the [place]? Per our chart he should have started, but LDL is still above goal. He should qualify for copayment card to reduce cost.

## 2018-03-03 DIAGNOSIS — M450 Ankylosing spondylitis of multiple sites in spine: Secondary | ICD-10-CM | POA: Diagnosis not present

## 2018-03-03 MED ORDER — ICOSAPENT ETHYL 1 G PO CAPS: 2.0000 g | ORAL_CAPSULE | Freq: Two times a day (BID) | ORAL | 3 refills | Status: DC

## 2018-03-03 NOTE — Telephone Encounter (Signed)
Looking at most recent lipid panel patient has started Repatha. Will continue and ensure that reauth is good through 2020. Would still recommend change to Vascepa for TG lowering and CV benefit.

## 2018-03-03 NOTE — Telephone Encounter (Signed)
Called and spoke to patient and made him aware of lab results. Patient states that he has been on the [place]. Made patient aware of recommendations to switch OTC fish oil to Vascepa 2 G BID and we will recheck LIPIDS and LFTS in 2 months. Patient verbalized understanding and was in agreement with this plan. Rx sent in to preferred pharmacy with co-pay savings card in Rx and fasting lab appointment made for 2/17.

## 2018-03-26 ENCOUNTER — Other Ambulatory Visit: Payer: Self-pay | Admitting: Interventional Cardiology

## 2018-03-26 DIAGNOSIS — E7800 Pure hypercholesterolemia, unspecified: Secondary | ICD-10-CM

## 2018-03-26 NOTE — Telephone Encounter (Signed)
Called patient and he states that his Repatha is $1000/mo. and that he cannot afford that. ON phone call from 12/04/17, patient was approved for Repatha through insurance.  Made him aware that will forward to PharmD review and they will reach out to him regarding the cost of his Repatha. Patient verbalized understanding and thanked me for the call.

## 2018-03-26 NOTE — Telephone Encounter (Signed)
New Message:      Patient says he can not afford the [place]. He wants Dr Irish Lack to recommend something else please.

## 2018-03-28 MED ORDER — EVOLOCUMAB 140 MG/ML ~~LOC~~ SOAJ: 140.0000 mg | SUBCUTANEOUS | 11 refills | Status: DC

## 2018-03-28 NOTE — Telephone Encounter (Signed)
called pt to make him aware that he can't use specialty pharmacy anymore and that his rx of repatha was sent [facility] [address] from [place] the PharmD

## 2018-03-28 NOTE — Telephone Encounter (Signed)
Pt can no longer fill with specialty pharmacy. [facility] along with a copay card. Rx is $5. Will inform patient.

## 2018-04-14 DIAGNOSIS — M450 Ankylosing spondylitis of multiple sites in spine: Secondary | ICD-10-CM | POA: Diagnosis not present

## 2018-04-17 DIAGNOSIS — M5136 Other intervertebral disc degeneration, lumbar region: Secondary | ICD-10-CM | POA: Diagnosis not present

## 2018-04-17 DIAGNOSIS — M797 Fibromyalgia: Secondary | ICD-10-CM | POA: Diagnosis not present

## 2018-04-17 DIAGNOSIS — M450 Ankylosing spondylitis of multiple sites in spine: Secondary | ICD-10-CM | POA: Diagnosis not present

## 2018-05-05 ENCOUNTER — Other Ambulatory Visit: Payer: BLUE CROSS/BLUE SHIELD

## 2018-05-12 ENCOUNTER — Other Ambulatory Visit: Payer: BLUE CROSS/BLUE SHIELD

## 2018-05-12 DIAGNOSIS — E782 Mixed hyperlipidemia: Secondary | ICD-10-CM

## 2018-05-12 LAB — LIPID PANEL
CHOL/HDL RATIO: 3.2 ratio (ref 0.0–5.0)
CHOLESTEROL TOTAL: 135 mg/dL (ref 100–199)
HDL: 42 mg/dL (ref >39)
LDL Calculated: 56 mg/dL (ref 0–99)
Triglycerides: 183 mg/dL — ABNORMAL HIGH (ref 0–149)
VLDL Cholesterol Cal: 37 mg/dL (ref 5–40)

## 2018-05-12 LAB — HEPATIC FUNCTION PANEL
ALBUMIN: 4.2 g/dL (ref 3.8–4.9)
ALT: 226 IU/L — ABNORMAL HIGH (ref 0–44)
AST: 128 IU/L — ABNORMAL HIGH (ref 0–40)
Alkaline Phosphatase: 126 IU/L — ABNORMAL HIGH (ref 39–117)
BILIRUBIN TOTAL: 0.6 mg/dL (ref 0.0–1.2)
Bilirubin, Direct: 0.21 mg/dL (ref 0.00–0.40)
TOTAL PROTEIN: 6.8 g/dL (ref 6.0–8.5)

## 2018-05-15 ENCOUNTER — Telehealth: Payer: Self-pay | Admitting: Interventional Cardiology

## 2018-05-15 NOTE — Telephone Encounter (Signed)
New Message:     Patient stated that some one called him today. Please call patient back.

## 2018-05-15 NOTE — Telephone Encounter (Signed)
Left message for patient to call back  

## 2018-05-15 NOTE — Telephone Encounter (Signed)
-----   Message from Leeroy Bock, [facility] sent at 05/15/2018 12:57 PM EST ----- LFTs have increased since last lab check in December, only medication change was switching OTC fish oil to Vascepa. ALT previously stable ~120s pre and post Repatha start. Vascepa is not know to increase LFTs, so would confirm with pt that he has not been drinking excess alcohol or using Tylenol frequently. If he has not been, could trial off Vascepa for 1 month and recheck LFTs to see if they improve. If they do not, would recommend resuming Vascepa but referring patient to GI for further workup.

## 2018-05-15 NOTE — Telephone Encounter (Signed)
Called and spoke to patient and made him aware of lab results. Patient denies any excessive alcohol or tylenol use. Patient will hold vascepa. Patient already established with GI and plans to see them in 6 weeks and will have them recheck LFTs.

## 2018-05-26 DIAGNOSIS — M450 Ankylosing spondylitis of multiple sites in spine: Secondary | ICD-10-CM | POA: Diagnosis not present

## 2018-07-15 ENCOUNTER — Other Ambulatory Visit: Payer: Self-pay | Admitting: Gastroenterology

## 2018-07-15 DIAGNOSIS — Z8719 Personal history of other diseases of the digestive system: Secondary | ICD-10-CM | POA: Diagnosis not present

## 2018-07-15 DIAGNOSIS — K760 Fatty (change of) liver, not elsewhere classified: Secondary | ICD-10-CM

## 2018-07-15 DIAGNOSIS — R945 Abnormal results of liver function studies: Secondary | ICD-10-CM | POA: Diagnosis not present

## 2018-07-15 DIAGNOSIS — R7989 Other specified abnormal findings of blood chemistry: Secondary | ICD-10-CM

## 2018-07-15 DIAGNOSIS — K219 Gastro-esophageal reflux disease without esophagitis: Secondary | ICD-10-CM | POA: Diagnosis not present

## 2018-07-16 DIAGNOSIS — Z8719 Personal history of other diseases of the digestive system: Secondary | ICD-10-CM | POA: Diagnosis not present

## 2018-07-16 DIAGNOSIS — R945 Abnormal results of liver function studies: Secondary | ICD-10-CM | POA: Diagnosis not present

## 2018-07-17 DIAGNOSIS — M797 Fibromyalgia: Secondary | ICD-10-CM | POA: Diagnosis not present

## 2018-07-17 DIAGNOSIS — M450 Ankylosing spondylitis of multiple sites in spine: Secondary | ICD-10-CM | POA: Diagnosis not present

## 2018-07-17 DIAGNOSIS — M5136 Other intervertebral disc degeneration, lumbar region: Secondary | ICD-10-CM | POA: Diagnosis not present

## 2018-07-17 DIAGNOSIS — E669 Obesity, unspecified: Secondary | ICD-10-CM | POA: Diagnosis not present

## 2018-10-16 DIAGNOSIS — M8580 Other specified disorders of bone density and structure, unspecified site: Secondary | ICD-10-CM | POA: Diagnosis not present

## 2018-10-16 DIAGNOSIS — M797 Fibromyalgia: Secondary | ICD-10-CM | POA: Diagnosis not present

## 2018-10-16 DIAGNOSIS — M5136 Other intervertebral disc degeneration, lumbar region: Secondary | ICD-10-CM | POA: Diagnosis not present

## 2018-10-16 DIAGNOSIS — M450 Ankylosing spondylitis of multiple sites in spine: Secondary | ICD-10-CM | POA: Diagnosis not present

## 2018-11-06 ENCOUNTER — Telehealth: Payer: Self-pay

## 2018-11-06 MED ORDER — PRALUENT 150 MG/ML ~~LOC~~ SOAJ: 150.0000 mg | SUBCUTANEOUS | 6 refills | Status: DC

## 2018-11-06 NOTE — Telephone Encounter (Signed)
Called and LMOMED a pt regarding having to switch from repaths to praluent due to formulary drug change sent rx to [facility] and told the pt to call and check on the price before going to pick it up and if it is unaffordable to call the [facility] office pharmacy dpt

## 2019-02-22 ENCOUNTER — Other Ambulatory Visit: Payer: Self-pay | Admitting: Interventional Cardiology

## 2019-04-01 ENCOUNTER — Other Ambulatory Visit: Payer: Self-pay | Admitting: Interventional Cardiology

## 2019-08-02 NOTE — Progress Notes (Signed)
Cardiology Office Note   Date:  08/03/2019   ID:  [name], DOB 1961/03/18, MRN [id number]  PCP:  Maury Dus, MD    No chief complaint on file.  CAD  Wt Readings from Last 3 Encounters:  08/03/19 180 lb (81.6 kg)  11/06/17 206 lb 6.4 oz (93.6 kg)  06/26/17 205 lb (93 kg)       History of Present Illness: [name] is a 59 y.o. male  who had an LAD stent in 2008.His angina was an exertional chest tightness. It occurred with mowing the lawn. It would stop with rest. Sx resolved after the stent. Of note, he was seen at [facility] ER and had a negative w/u just before his cardiac w/u with me.   He has had some issues with fatty liver. His atorvastatin was stopped due to increased LFTs. He was started on Crestor in 4/19.  He has had some palpitations earlier in 2019.  Holter monitor at that time showed:  Normal sinus rhythm with occasional PACs and PVCs.  Short runs of SVT, less than 5 beats typically.  No sustained arrhtyhmias.  Continue medical therapy. If symptoms worsen, could increase beta blocker.   After the monitor, he had no further CP or palpitations.  He was walking regularly, several times a week, 20-40 minutes at a time.    Crestor was stopped in July 2019 due to increased LFTs.  He has f/u with [facility] in September 2019.    I have not seen him since 8/19.  Since then, he has done well. He states that the LFTs did not improve even off of the Crestor.  He was on Praluent but stopped this.      Past Medical History:  Diagnosis Date  . Anemia   . Ankylosing spondylitis ([place])   . Arthritis   . Asthma    SINCE AGE 66  . Colitis, ulcerative ([place])   . Coronary artery disease    Bare metal LAD stent 10/08  . Coronary atherosclerosis of native coronary artery 12/25/2012  . ED (erectile dysfunction)   . Elevated prostate specific antigen (PSA) 12/25/2012  . Esophageal reflux 12/25/2012  . Extrinsic asthma, unspecified 12/25/2012  .  Fibromyalgia   . GERD (gastroesophageal reflux disease)   . Hypercalcemia 12/25/2012  . Hyperlipidemia   . Left sided ulcerative (chronic) colitis ([place]) 12/25/2012  . Mixed hyperlipidemia 12/25/2012  . Pneumonia    as a child  . Pure hypercholesterolemia 12/25/2012  . TMJ (dislocation of temporomandibular joint)     Past Surgical History:  Procedure Laterality Date  . COLONOSCOPY    . EYE SURGERY Bilateral   . heart stent    . skin cancer removed from left eyelid     . skull fracture after falling down stairs    . TONSILLECTOMY       Current Outpatient Medications  Medication Sig Dispense Refill  . albuterol (PROVENTIL HFA;VENTOLIN HFA) 108 (90 BASE) MCG/ACT inhaler Inhale 2 puffs into the lungs every 6 (six) hours as needed for wheezing.    . Alirocumab (PRALUENT) 150 MG/ML SOAJ Inject 150 mg into the skin every 14 (fourteen) days. 2 pen 6  . aspirin 81 MG tablet Take 81 mg by mouth daily.    . cyclobenzaprine (FLEXERIL) 5 MG tablet Take 5 mg by mouth at bedtime.     . DOCOSAHEXAENOIC ACID PO Take 1 g by mouth 2 (two) times daily.    . ferrous sulfate  325 (65 FE) MG EC tablet Take 325 mg by mouth 2 (two) times daily.    Marland Kitchen gabapentin (NEURONTIN) 100 MG capsule Take 1 capsule by mouth daily.    Marland Kitchen HYDROcodone-acetaminophen (NORCO) 10-325 MG per tablet Take 1-2 tablets by mouth daily.   0  . icosapent Ethyl (VASCEPA) 1 g capsule Take 2 capsules (2 g total) by mouth 2 (two) times daily. Please make overdue appt with Dr. Irish Lack before anymore refills. 2nd attempt 60 capsule 0  . loratadine (CLARITIN) 10 MG tablet Take 10 mg by mouth daily.    . Multiple Vitamins-Minerals (CENTRUM CARDIO) TABS Take by mouth 2 (two) times daily.    . nitroGLYCERIN (NITROSTAT) 0.4 MG SL tablet Place 1 tablet (0.4 mg total) under the tongue every 5 (five) minutes as needed for chest pain. 25 tablet 6  . omeprazole (PRILOSEC) 20 MG capsule Take 20 mg by mouth every other day.    . predniSONE (DELTASONE) 5  MG tablet Take 5 mg by mouth daily.    Marland Kitchen sulfaSALAzine (AZULFIDINE) 500 MG EC tablet Take 500 mg by mouth. 2 TABLETS BID    . theophylline (THEODUR) 300 MG 12 hr tablet Take 300 mg by mouth 2 (two) times daily.     No current facility-administered medications for this visit.    Allergies:   Patient has no known allergies.    Social History:  The patient  reports that he has never smoked. He has quit using smokeless tobacco. He reports current alcohol use.   Family History:  The patient's family history includes COPD in his maternal grandfather; Emphysema in his maternal grandfather; GER disease in his sister; Hypertension in his mother; Lung cancer in his father.    ROS:  Please see the history of present illness.   Otherwise, review of systems are positive for weight loss.   All other systems are reviewed and negative.    PHYSICAL EXAM: VS:  BP 118/82   Pulse (!) 106   Ht 5\' 9"  (1.753 m)   Wt 180 lb (81.6 kg)   SpO2 97%   BMI 26.58 kg/m  , BMI Body mass index is 26.58 kg/m. GEN: Well nourished, well developed, in no acute distress  HEENT: normal  Neck: no JVD, carotid bruits, or masses Cardiac: RRR; no murmurs, rubs, or gallops,no edema ; heart rate has decreased on exam Respiratory:  clear to auscultation bilaterally, normal work of breathing GI: soft, nontender, nondistended, + BS MS: no deformity or atrophy  Skin: warm and dry, no rash Neuro:  Strength and sensation are intact Psych: euthymic mood, full affect   EKG:   The ekg ordered today demonstrates sinus tachycardia, no significant ST changes  Recent Labs: No results found for requested labs within last 8760 hours.   Lipid Panel    Component Value Date/Time   CHOL 135 05/12/2018 0958   TRIG 183 (H) 05/12/2018 0958   HDL 42 05/12/2018 0958   CHOLHDL 3.2 05/12/2018 0958   CHOLHDL 3 02/04/2014 1010   VLDL 20.6 02/04/2014 1010   LDLCALC 56 05/12/2018 0958     Other studies Reviewed: Additional studies/  records that were reviewed today with results demonstrating: labs reviewed; elevated LFTs in 04/2018 .   ASSESSMENT AND PLAN:  1. CAD: No angina on medical therapy.  Refill NTG. COntinue aggressive secondary prevention.   2. Hyperlipidemia: Should be on some therapy given CAD, but LFTs have limited this.  We will see where his lipids and LFTs  are at this point.  If LFTs are back to normal with his weight loss, could consider adding back statin.  If his LFTs are still high, may go back to Praluent.  He did not have any problems with the injectable medication.  He just stopped it for no specific reason. 3. Elevated LFTs: Check LFTs today. Rare EtOH use.  4. Obesity: Lost weight since last visit. Eating healthier. Walking some.  5. Palpitations: Sx resolved.    Current medicines are reviewed at length with the patient today.  The patient concerns regarding his medicines were addressed.  The following changes have been made:  No change  Labs/ tests ordered today include:  No orders of the defined types were placed in this encounter.   Recommend 150 minutes/week of aerobic exercise Low fat, low carb, high fiber diet recommended  Disposition:   FU in 6 monhts   Signed, Larae Grooms, MD  08/03/2019 12:01 PM    [facility] [address], [place], [place]  [address] Phone: [phone]; Fax: [phone]

## 2019-08-03 ENCOUNTER — Ambulatory Visit: Payer: Self-pay | Admitting: Interventional Cardiology

## 2019-08-03 ENCOUNTER — Encounter: Payer: Self-pay | Admitting: Interventional Cardiology

## 2019-08-03 ENCOUNTER — Other Ambulatory Visit: Payer: Self-pay

## 2019-08-03 VITALS — BP 118/82 | HR 106 | Ht 69.0 in | Wt 180.0 lb

## 2019-08-03 DIAGNOSIS — R7989 Other specified abnormal findings of blood chemistry: Secondary | ICD-10-CM

## 2019-08-03 DIAGNOSIS — E7800 Pure hypercholesterolemia, unspecified: Secondary | ICD-10-CM

## 2019-08-03 DIAGNOSIS — R002 Palpitations: Secondary | ICD-10-CM

## 2019-08-03 DIAGNOSIS — E782 Mixed hyperlipidemia: Secondary | ICD-10-CM

## 2019-08-03 DIAGNOSIS — I25118 Atherosclerotic heart disease of native coronary artery with other forms of angina pectoris: Secondary | ICD-10-CM

## 2019-08-03 MED ORDER — NITROGLYCERIN 0.4 MG SL SUBL: 0.4000 mg | SUBLINGUAL_TABLET | SUBLINGUAL | 6 refills | Status: DC | PRN

## 2019-08-03 NOTE — Patient Instructions (Signed)
Medication Instructions:  Your physician recommends that you continue on your current medications as directed. Please refer to the Current Medication list given to you today.  *If you need a refill on your cardiac medications before your next appointment, please call your pharmacy*   Lab Work: TODAY: CMET, LIPIDS  If you have labs (blood work) drawn today and your tests are completely normal, you will receive your results only by: Marland Kitchen MyChart Message (if you have MyChart) OR . A paper copy in the mail If you have any lab test that is abnormal or we need to change your treatment, we will call you to review the results.   Testing/Procedures: None ordered   Follow-Up: At [facility], you and your health needs are our priority.  As part of our continuing mission to provide you with exceptional heart care, we have created designated Provider Care Teams.  These Care Teams include your primary Cardiologist (physician) and Advanced Practice Providers (APPs -  Physician Assistants and Nurse Practitioners) who all work together to provide you with the care you need, when you need it.  We recommend signing up for the patient portal called "MyChart".  Sign up information is provided on this After Visit Summary.  MyChart is used to connect with patients for Virtual Visits (Telemedicine).  Patients are able to view lab/test results, encounter notes, upcoming appointments, etc.  Non-urgent messages can be sent to your provider as well.   To learn more about what you can do with MyChart, go to [email or web address].    Your next appointment:   6 month(s)  The format for your next appointment:   In Person  Provider:   You may see Casandra Doffing, MD or one of the following Advanced Practice Providers on your designated Care Team:    Melina Copa, PA-C  Ermalinda Barrios, PA-C    Other Instructions None

## 2019-08-04 LAB — COMPREHENSIVE METABOLIC PANEL
ALT: 40 IU/L (ref 0–44)
AST: 23 IU/L (ref 0–40)
Albumin/Globulin Ratio: 1.6 (ref 1.2–2.2)
Albumin: 4.5 g/dL (ref 3.8–4.9)
Alkaline Phosphatase: 130 IU/L — ABNORMAL HIGH (ref 48–121)
BUN/Creatinine Ratio: 19 (ref 9–20)
BUN: 16 mg/dL (ref 6–24)
Bilirubin Total: 0.5 mg/dL (ref 0.0–1.2)
CO2: 23 mmol/L (ref 20–29)
Calcium: 10.3 mg/dL — ABNORMAL HIGH (ref 8.7–10.2)
Chloride: 99 mmol/L (ref 96–106)
Creatinine, Ser: 0.83 mg/dL (ref 0.76–1.27)
GFR calc Af Amer: 112 mL/min/{1.73_m2} (ref >59)
GFR calc non Af Amer: 97 mL/min/{1.73_m2} (ref >59)
Globulin, Total: 2.8 g/dL (ref 1.5–4.5)
Glucose: 384 mg/dL — ABNORMAL HIGH (ref 65–99)
Potassium: 4.7 mmol/L (ref 3.5–5.2)
Sodium: 137 mmol/L (ref 134–144)
Total Protein: 7.3 g/dL (ref 6.0–8.5)

## 2019-08-04 LAB — LIPID PANEL
Chol/HDL Ratio: 7.6 ratio — ABNORMAL HIGH (ref 0.0–5.0)
Cholesterol, Total: 298 mg/dL — ABNORMAL HIGH (ref 100–199)
HDL: 39 mg/dL — ABNORMAL LOW (ref >39)
LDL Chol Calc (NIH): 207 mg/dL — ABNORMAL HIGH (ref 0–99)
Triglycerides: 261 mg/dL — ABNORMAL HIGH (ref 0–149)
VLDL Cholesterol Cal: 52 mg/dL — ABNORMAL HIGH (ref 5–40)

## 2019-08-05 ENCOUNTER — Telehealth: Payer: Self-pay

## 2019-08-05 DIAGNOSIS — E782 Mixed hyperlipidemia: Secondary | ICD-10-CM

## 2019-08-05 NOTE — Telephone Encounter (Signed)
Pt called back and I went over his Lipid results and he reports that he is NOT taking the Vascepa. He says he needs time to review his report and decide how he will proceed. He says he is not able to determine this right now. He asked me to email his results and he is not on My Chart. I advised him that I will mail his results to him. He says after he reviews them and thinks about it, he will call our office back.

## 2019-08-05 NOTE — Telephone Encounter (Signed)
LMTCB

## 2019-08-05 NOTE — Telephone Encounter (Signed)
-----   Message from Ramond Dial, [place] sent at 08/05/2019 11:38 AM EDT ----- [place] please have patient resume his Praluent 150mg . Make sure he is taking his Vascepa. He needs to decrease carbs/sugars/alcohol. Will need to repeat labs in 2 months.

## 2019-08-24 NOTE — Telephone Encounter (Signed)
Called and spoke to patient. Patient states that he has made some changes to his diet, joined the [facility], and has restarted the Vascepa. Made patient aware that PharmD would like for him to restart his Praluent 150 mg. He is requesting a refill for his Praluent. Made patient aware that I will forward to PharmD to refill. Patient has been scheduled for fasting LIPIDS on 8/9.

## 2019-08-24 NOTE — Telephone Encounter (Signed)
Follow up ° ° °Patient is returning your call. Please call. ° ° ° °

## 2019-08-24 NOTE — Telephone Encounter (Signed)
Patient is calling to follow up in regards to results. He states he has additional questions. Please call.

## 2019-08-24 NOTE — Telephone Encounter (Signed)
Left message to call back  

## 2019-08-25 MED ORDER — PRALUENT 150 MG/ML ~~LOC~~ SOAJ: 150.0000 mg | SUBCUTANEOUS | 6 refills | Status: DC

## 2019-08-25 NOTE — Telephone Encounter (Signed)
Rx for Praluent 150 sent to pharmacy.

## 2019-09-10 ENCOUNTER — Telehealth: Payer: Self-pay | Admitting: Interventional Cardiology

## 2019-09-10 NOTE — Telephone Encounter (Signed)
New Message   Pt c/o medication issue:  1. Name of Medication: Alirocumab (PRALUENT) 150 MG/ML SOAJ  2. How are you currently taking this medication (dosage and times per day)? Injection e very 14 days   3. Are you having a reaction (difficulty breathing--STAT)? No   4. What is your medication issue? Pt is calling and says his insurance will not cover the medication  He is wondering about changing medication  Please call

## 2019-09-11 NOTE — Telephone Encounter (Signed)
PA for Repatha sent to new insurance. Left VM per DPR to inform pt

## 2019-09-16 NOTE — Telephone Encounter (Signed)
Called [facility] to follow up on PA since no indication has been received. States they received and its still under review

## 2019-09-17 NOTE — Telephone Encounter (Signed)
Follow up  Pt is calling back to follow up

## 2019-09-17 NOTE — Telephone Encounter (Signed)
Called patient back. Assured him that we did submit PA for Repatha since that's what the insurance prefers. Called yesterday and they stated it was still under review. Patient will pick up sample tomorrow.

## 2019-10-12 ENCOUNTER — Other Ambulatory Visit: Payer: Self-pay | Admitting: Interventional Cardiology

## 2019-10-21 ENCOUNTER — Telehealth: Payer: Self-pay | Admitting: Pharmacist

## 2019-10-21 MED ORDER — REPATHA SURECLICK 140 MG/ML ~~LOC~~ SOAJ: 1.0000 "pen " | SUBCUTANEOUS | 11 refills | Status: DC

## 2019-10-21 NOTE — Telephone Encounter (Signed)
Repatha approved through 10/14/20 I sent Rx to patient pharmacy. I tried to activate copay card for patient but it said they needed for info and patient needed to call [phone] Called and left VM for patient to return call

## 2019-10-26 ENCOUNTER — Other Ambulatory Visit: Payer: PRIVATE HEALTH INSURANCE | Admitting: *Deleted

## 2019-10-26 ENCOUNTER — Other Ambulatory Visit: Payer: Self-pay

## 2019-10-26 DIAGNOSIS — E782 Mixed hyperlipidemia: Secondary | ICD-10-CM

## 2019-10-26 LAB — LIPID PANEL
Chol/HDL Ratio: 6.5 ratio — ABNORMAL HIGH (ref 0.0–5.0)
Cholesterol, Total: 213 mg/dL — ABNORMAL HIGH (ref 100–199)
HDL: 33 mg/dL — ABNORMAL LOW (ref >39)
LDL Chol Calc (NIH): 154 mg/dL — ABNORMAL HIGH (ref 0–99)
Triglycerides: 143 mg/dL (ref 0–149)
VLDL Cholesterol Cal: 26 mg/dL (ref 5–40)

## 2019-10-27 ENCOUNTER — Telehealth: Payer: Self-pay | Admitting: Pharmacist

## 2019-10-27 DIAGNOSIS — E782 Mixed hyperlipidemia: Secondary | ICD-10-CM

## 2019-10-27 MED ORDER — REPATHA SURECLICK 140 MG/ML ~~LOC~~ SOAJ: 1.0000 "pen " | SUBCUTANEOUS | 11 refills | Status: DC

## 2019-10-27 NOTE — Telephone Encounter (Signed)
Left message for pt to discuss lab results.  TG have normalized since starting Vascepa, however LDL has increased notably. Previously in the 40-60s on Repatha which pt should still be taking. Called pt to see if he has stopped his Repatha therapy since LDL has tripled.

## 2019-10-27 NOTE — Telephone Encounter (Signed)
Spoke with pt about lab results. He has not been taking his Repatha, states there was an issue with rx. Advised him the prior Josem Kaufmann is approved for a year. Apparently he has not been using the pharmacy that rx was sent to. Sent in refill to current pharmacy and advised pt to resume his Repatha. He will continue other meds including Vascepa and recheck lipids in 2 months to ensure LDL has dropped back to goal.

## 2019-10-27 NOTE — Addendum Note (Signed)
Addended by: Shawnee Gambone E on: 10/27/2019 05:11 PM   Modules accepted: Orders

## 2019-11-02 ENCOUNTER — Telehealth: Payer: Self-pay

## 2019-11-02 MED ORDER — REPATHA SURECLICK 140 MG/ML ~~LOC~~ SOAJ: 1.0000 "pen " | SUBCUTANEOUS | 3 refills | Status: DC

## 2019-11-02 NOTE — Addendum Note (Signed)
Addended by: Allean Found on: 11/02/2019 01:48 PM   Modules accepted: Orders

## 2019-11-02 NOTE — Telephone Encounter (Signed)
Called and spoke w/pt rx sent to specialty pharmacy as insurance requires it and sample placed out fromt by chris pavero pharmd at [facility] for the pt, pt voiced understanding

## 2019-11-02 NOTE — Telephone Encounter (Signed)
lmomed the pt to call us back to let us know how the repatha was going because we keep getting the papers populating from cover my meds saying they need a pa but I think its just a refil to soon request.

## 2019-11-03 ENCOUNTER — Other Ambulatory Visit: Payer: Self-pay

## 2019-11-03 ENCOUNTER — Telehealth: Payer: Self-pay | Admitting: Pharmacist

## 2019-11-03 MED ORDER — REPATHA SURECLICK 140 MG/ML ~~LOC~~ SOAJ: 1.0000 "pen " | SUBCUTANEOUS | 3 refills | Status: DC

## 2019-11-03 NOTE — Telephone Encounter (Signed)
Medication Samples have been provided to the patient.  Drug name: Repatha        Strength: 140mg       Qty: 1  LOT: [id number]  Exp.Date: 01/23  Dosing instructions: Inject 1 ml every 14 days  The patient has been instructed regarding the correct time, dose, and frequency of taking this medication, including desired effects and most common side effects.   Harrell Gave Anthonie Lotito 11:10 AM 11/03/2019

## 2019-11-03 NOTE — Telephone Encounter (Signed)
Received fax from [facility] that they do not carry Repatha and that rx would need to be sent to patient's preferred local pharmacy.

## 2019-11-03 NOTE — Telephone Encounter (Signed)
Called and spoke w/pt stated that [facility] no longer carries repatha and that we would have to send the rx some where else. The pt said that he would give me a call back later with information of the pharmacy that he would want it sent to at a later time.

## 2019-11-20 ENCOUNTER — Telehealth: Payer: Self-pay | Admitting: Interventional Cardiology

## 2019-11-20 NOTE — Telephone Encounter (Signed)
[name] with [facility] is requesting the ICD10 diagnoses code for Repatha Rx. Please return call to verify at [phone].

## 2019-11-20 NOTE — Telephone Encounter (Signed)
Returned a call to [facility] and they stated that they no longer needed the information because the order was being processed for shipment already

## 2019-11-23 ENCOUNTER — Emergency Department (HOSPITAL_COMMUNITY): Payer: PRIVATE HEALTH INSURANCE

## 2019-11-23 ENCOUNTER — Inpatient Hospital Stay (HOSPITAL_COMMUNITY): Payer: PRIVATE HEALTH INSURANCE

## 2019-11-23 ENCOUNTER — Inpatient Hospital Stay (HOSPITAL_COMMUNITY)
Admission: EM | Disposition: A | Discharge status: Home / Self Care | Payer: Self-pay | Source: Home / Self Care | Attending: Cardiovascular Disease

## 2019-11-23 ENCOUNTER — Inpatient Hospital Stay (HOSPITAL_COMMUNITY)
Admission: EM | Admit: 2019-11-23 | Discharge: 2019-11-24 | DRG: 247 | Disposition: A | Discharge status: Home / Self Care | Payer: PRIVATE HEALTH INSURANCE | Attending: Cardiovascular Disease | Admitting: Cardiovascular Disease

## 2019-11-23 DIAGNOSIS — Z85828 Personal history of other malignant neoplasm of skin: Secondary | ICD-10-CM

## 2019-11-23 DIAGNOSIS — I2510 Atherosclerotic heart disease of native coronary artery without angina pectoris: Secondary | ICD-10-CM | POA: Diagnosis present

## 2019-11-23 DIAGNOSIS — K219 Gastro-esophageal reflux disease without esophagitis: Secondary | ICD-10-CM

## 2019-11-23 DIAGNOSIS — Z79891 Long term (current) use of opiate analgesic: Secondary | ICD-10-CM

## 2019-11-23 DIAGNOSIS — E119 Type 2 diabetes mellitus without complications: Secondary | ICD-10-CM | POA: Diagnosis present

## 2019-11-23 DIAGNOSIS — M459 Ankylosing spondylitis of unspecified sites in spine: Secondary | ICD-10-CM | POA: Diagnosis present

## 2019-11-23 DIAGNOSIS — I2109 ST elevation (STEMI) myocardial infarction involving other coronary artery of anterior wall: Principal | ICD-10-CM | POA: Diagnosis present

## 2019-11-23 DIAGNOSIS — J309 Allergic rhinitis, unspecified: Secondary | ICD-10-CM | POA: Diagnosis present

## 2019-11-23 DIAGNOSIS — R079 Chest pain, unspecified: Secondary | ICD-10-CM | POA: Diagnosis not present

## 2019-11-23 DIAGNOSIS — M797 Fibromyalgia: Secondary | ICD-10-CM | POA: Diagnosis present

## 2019-11-23 DIAGNOSIS — Z20822 Contact with and (suspected) exposure to covid-19: Secondary | ICD-10-CM | POA: Diagnosis present

## 2019-11-23 DIAGNOSIS — E118 Type 2 diabetes mellitus with unspecified complications: Secondary | ICD-10-CM

## 2019-11-23 DIAGNOSIS — I213 ST elevation (STEMI) myocardial infarction of unspecified site: Secondary | ICD-10-CM | POA: Diagnosis present

## 2019-11-23 DIAGNOSIS — E785 Hyperlipidemia, unspecified: Secondary | ICD-10-CM | POA: Diagnosis not present

## 2019-11-23 DIAGNOSIS — Z955 Presence of coronary angioplasty implant and graft: Secondary | ICD-10-CM

## 2019-11-23 DIAGNOSIS — Z7289 Other problems related to lifestyle: Secondary | ICD-10-CM

## 2019-11-23 DIAGNOSIS — Z7982 Long term (current) use of aspirin: Secondary | ICD-10-CM

## 2019-11-23 DIAGNOSIS — Z7952 Long term (current) use of systemic steroids: Secondary | ICD-10-CM

## 2019-11-23 DIAGNOSIS — R972 Elevated prostate specific antigen [PSA]: Secondary | ICD-10-CM | POA: Diagnosis present

## 2019-11-23 DIAGNOSIS — I2102 ST elevation (STEMI) myocardial infarction involving left anterior descending coronary artery: Secondary | ICD-10-CM

## 2019-11-23 DIAGNOSIS — E782 Mixed hyperlipidemia: Secondary | ICD-10-CM | POA: Diagnosis present

## 2019-11-23 DIAGNOSIS — Z79899 Other long term (current) drug therapy: Secondary | ICD-10-CM

## 2019-11-23 HISTORY — PX: CORONARY/GRAFT ACUTE MI REVASCULARIZATION: CATH118305

## 2019-11-23 HISTORY — PX: LEFT HEART CATH AND CORONARY ANGIOGRAPHY: CATH118249

## 2019-11-23 LAB — CBC WITH DIFFERENTIAL/PLATELET
Abs Immature Granulocytes: 0.03 10*3/uL (ref 0.00–0.07)
Basophils Absolute: 0.2 10*3/uL — ABNORMAL HIGH (ref 0.0–0.1)
Basophils Relative: 2 %
Eosinophils Absolute: 0.3 10*3/uL (ref 0.0–0.5)
Eosinophils Relative: 3 %
HCT: 40.8 % (ref 39.0–52.0)
Hemoglobin: 13.6 g/dL (ref 13.0–17.0)
Immature Granulocytes: 0 %
Lymphocytes Relative: 20 %
Lymphs Abs: 2 10*3/uL (ref 0.7–4.0)
MCH: 30.9 pg (ref 26.0–34.0)
MCHC: 33.3 g/dL (ref 30.0–36.0)
MCV: 92.7 fL (ref 80.0–100.0)
Monocytes Absolute: 0.5 10*3/uL (ref 0.1–1.0)
Monocytes Relative: 5 %
Neutro Abs: 7 10*3/uL (ref 1.7–7.7)
Neutrophils Relative %: 70 %
Platelets: 288 10*3/uL (ref 150–400)
RBC: 4.4 MIL/uL (ref 4.22–5.81)
RDW: 12.1 % (ref 11.5–15.5)
WBC: 9.9 10*3/uL (ref 4.0–10.5)
nRBC: 0 % (ref 0.0–0.2)

## 2019-11-23 LAB — PROTIME-INR
INR: 1.1 (ref 0.8–1.2)
Prothrombin Time: 13.4 seconds (ref 11.4–15.2)

## 2019-11-23 LAB — COMPREHENSIVE METABOLIC PANEL
ALT: 19 U/L (ref 0–44)
AST: 21 U/L (ref 15–41)
Albumin: 3.5 g/dL (ref 3.5–5.0)
Alkaline Phosphatase: 74 U/L (ref 38–126)
Anion gap: 12 (ref 5–15)
BUN: 13 mg/dL (ref 6–20)
CO2: 22 mmol/L (ref 22–32)
Calcium: 9.4 mg/dL (ref 8.9–10.3)
Chloride: 103 mmol/L (ref 98–111)
Creatinine, Ser: 0.97 mg/dL (ref 0.61–1.24)
GFR calc Af Amer: >60 mL/min (ref >60)
GFR calc non Af Amer: >60 mL/min (ref >60)
Glucose, Bld: 285 mg/dL — ABNORMAL HIGH (ref 70–99)
Potassium: 3.8 mmol/L (ref 3.5–5.1)
Sodium: 137 mmol/L (ref 135–145)
Total Bilirubin: 0.8 mg/dL (ref 0.3–1.2)
Total Protein: 6.7 g/dL (ref 6.5–8.1)

## 2019-11-23 LAB — CBC
HCT: 41.5 % (ref 39.0–52.0)
Hemoglobin: 13.7 g/dL (ref 13.0–17.0)
MCH: 30.8 pg (ref 26.0–34.0)
MCHC: 33 g/dL (ref 30.0–36.0)
MCV: 93.3 fL (ref 80.0–100.0)
Platelets: 274 10*3/uL (ref 150–400)
RBC: 4.45 MIL/uL (ref 4.22–5.81)
RDW: 12.3 % (ref 11.5–15.5)
WBC: 11.3 10*3/uL — ABNORMAL HIGH (ref 4.0–10.5)
nRBC: 0 % (ref 0.0–0.2)

## 2019-11-23 LAB — TROPONIN I (HIGH SENSITIVITY)
Troponin I (High Sensitivity): 60 ng/L — ABNORMAL HIGH (ref <18)
Troponin I (High Sensitivity): 570 ng/L (ref <18)
Troponin I (High Sensitivity): 1468 ng/L (ref <18)

## 2019-11-23 LAB — LIPID PANEL
Cholesterol: 145 mg/dL (ref 0–200)
HDL: 31 mg/dL — ABNORMAL LOW (ref >40)
LDL Cholesterol: 72 mg/dL (ref 0–99)
Total CHOL/HDL Ratio: 4.7 RATIO
Triglycerides: 212 mg/dL — ABNORMAL HIGH (ref <150)
VLDL: 42 mg/dL — ABNORMAL HIGH (ref 0–40)

## 2019-11-23 LAB — HEMOGLOBIN A1C
Hgb A1c MFr Bld: 7.1 % — ABNORMAL HIGH (ref 4.8–5.6)
Hgb A1c MFr Bld: 7.1 % — ABNORMAL HIGH (ref 4.8–5.6)
Mean Plasma Glucose: 157.07 mg/dL
Mean Plasma Glucose: 157.07 mg/dL

## 2019-11-23 LAB — APTT: aPTT: 25 seconds (ref 24–36)

## 2019-11-23 LAB — SARS CORONAVIRUS 2 BY RT PCR (HOSPITAL ORDER, PERFORMED IN ~~LOC~~ HOSPITAL LAB): SARS Coronavirus 2: NEGATIVE

## 2019-11-23 LAB — HIV ANTIBODY (ROUTINE TESTING W REFLEX): HIV Screen 4th Generation wRfx: NONREACTIVE

## 2019-11-23 LAB — TSH: TSH: 1.411 u[IU]/mL (ref 0.350–4.500)

## 2019-11-23 SURGERY — CORONARY/GRAFT ACUTE MI REVASCULARIZATION
Anesthesia: LOCAL

## 2019-11-23 MED ORDER — FERROUS SULFATE 325 (65 FE) MG PO TABS
325.0000 mg | ORAL_TABLET | Freq: Two times a day (BID) | ORAL | Status: DC
  Administered 2019-11-23 – 2019-11-24 (×2): 325 mg via ORAL
  Filled 2019-11-23 (×2): qty 1

## 2019-11-23 MED ORDER — SODIUM CHLORIDE 0.9 % IV SOLN: 250.0000 mL | INTRAVENOUS | Status: DC | PRN

## 2019-11-23 MED ORDER — HEPARIN (PORCINE) IN NACL 2000-0.9 UNIT/L-% IV SOLN
INTRAVENOUS | Status: AC
  Filled 2019-11-23: qty 1000

## 2019-11-23 MED ORDER — ONDANSETRON HCL 4 MG/2ML IJ SOLN: 4.0000 mg | Freq: Four times a day (QID) | INTRAMUSCULAR | Status: DC | PRN

## 2019-11-23 MED ORDER — HEPARIN SODIUM (PORCINE) 1000 UNIT/ML IJ SOLN
INTRAMUSCULAR | Status: AC
  Filled 2019-11-23: qty 1

## 2019-11-23 MED ORDER — SODIUM CHLORIDE 0.9% FLUSH
3.0000 mL | Freq: Two times a day (BID) | INTRAVENOUS | Status: DC
  Administered 2019-11-24: 3 mL via INTRAVENOUS

## 2019-11-23 MED ORDER — ENOXAPARIN SODIUM 40 MG/0.4ML ~~LOC~~ SOLN: 40.0000 mg | SUBCUTANEOUS | Status: DC

## 2019-11-23 MED ORDER — THEOPHYLLINE ER 300 MG PO TB12
300.0000 mg | ORAL_TABLET | Freq: Two times a day (BID) | ORAL | Status: DC
  Administered 2019-11-23 – 2019-11-24 (×2): 300 mg via ORAL
  Filled 2019-11-23 (×3): qty 1

## 2019-11-23 MED ORDER — PREDNISONE 10 MG PO TABS
5.0000 mg | ORAL_TABLET | Freq: Every day | ORAL | Status: DC
  Administered 2019-11-24: 5 mg via ORAL
  Filled 2019-11-23: qty 1

## 2019-11-23 MED ORDER — ASPIRIN 81 MG PO TABS: 81.0000 mg | ORAL_TABLET | Freq: Every day | ORAL | Status: DC

## 2019-11-23 MED ORDER — FENTANYL CITRATE (PF) 100 MCG/2ML IJ SOLN
INTRAMUSCULAR | Status: AC
  Filled 2019-11-23: qty 2

## 2019-11-23 MED ORDER — SODIUM CHLORIDE 0.9 % IV SOLN: INTRAVENOUS | Status: DC

## 2019-11-23 MED ORDER — HEPARIN (PORCINE) IN NACL 1000-0.9 UT/500ML-% IV SOLN
INTRAVENOUS | Status: AC
  Filled 2019-11-23: qty 1000

## 2019-11-23 MED ORDER — PANTOPRAZOLE SODIUM 40 MG PO TBEC: 40.0000 mg | DELAYED_RELEASE_TABLET | Freq: Every day | ORAL | Status: DC

## 2019-11-23 MED ORDER — DIAZEPAM 5 MG PO TABS: 5.0000 mg | ORAL_TABLET | Freq: Three times a day (TID) | ORAL | Status: DC | PRN

## 2019-11-23 MED ORDER — ACETAMINOPHEN 325 MG PO TABS: 650.0000 mg | ORAL_TABLET | ORAL | Status: DC | PRN

## 2019-11-23 MED ORDER — CLOPIDOGREL BISULFATE 300 MG PO TABS
ORAL_TABLET | ORAL | Status: DC | PRN
  Administered 2019-11-23: 600 mg via ORAL

## 2019-11-23 MED ORDER — SODIUM CHLORIDE 0.9% FLUSH: 3.0000 mL | INTRAVENOUS | Status: DC | PRN

## 2019-11-23 MED ORDER — NITROGLYCERIN 0.4 MG SL SUBL: 0.4000 mg | SUBLINGUAL_TABLET | SUBLINGUAL | Status: DC | PRN

## 2019-11-23 MED ORDER — ZOLPIDEM TARTRATE 5 MG PO TABS: 5.0000 mg | ORAL_TABLET | Freq: Every evening | ORAL | Status: DC | PRN

## 2019-11-23 MED ORDER — OXYCODONE HCL 5 MG PO TABS: 5.0000 mg | ORAL_TABLET | ORAL | Status: DC | PRN

## 2019-11-23 MED ORDER — SODIUM CHLORIDE 0.9 % IV SOLN
INTRAVENOUS | Status: AC | PRN
  Administered 2019-11-23: 999 mL via INTRAVENOUS

## 2019-11-23 MED ORDER — IOHEXOL 350 MG/ML SOLN
INTRAVENOUS | Status: AC
  Filled 2019-11-23: qty 1

## 2019-11-23 MED ORDER — HYDROCODONE-ACETAMINOPHEN 10-325 MG PO TABS: 1.0000 | ORAL_TABLET | Freq: Four times a day (QID) | ORAL | Status: DC | PRN

## 2019-11-23 MED ORDER — NITROGLYCERIN 1 MG/10 ML FOR IR/CATH LAB
INTRA_ARTERIAL | Status: AC
  Filled 2019-11-23: qty 10

## 2019-11-23 MED ORDER — MORPHINE SULFATE (PF) 2 MG/ML IV SOLN: 2.0000 mg | INTRAVENOUS | Status: DC | PRN

## 2019-11-23 MED ORDER — PREDNISONE 5 MG PO TABS: 5.0000 mg | ORAL_TABLET | Freq: Every day | ORAL | Status: DC

## 2019-11-23 MED ORDER — HYDROCODONE-ACETAMINOPHEN 10-325 MG PO TABS: 1.0000 | ORAL_TABLET | Freq: Every day | ORAL | Status: DC

## 2019-11-23 MED ORDER — SODIUM CHLORIDE 0.9 % WEIGHT BASED INFUSION: 1.0000 mL/kg/h | INTRAVENOUS | Status: AC

## 2019-11-23 MED ORDER — FENTANYL CITRATE (PF) 100 MCG/2ML IJ SOLN
INTRAMUSCULAR | Status: DC | PRN
Start: 2019-11-23 — End: 2019-11-23
  Administered 2019-11-23 (×2): 25 ug via INTRAVENOUS

## 2019-11-23 MED ORDER — SODIUM CHLORIDE 0.9 % IV BOLUS
500.0000 mL | Freq: Once | INTRAVENOUS | Status: AC
  Administered 2019-11-23: 500 mL via INTRAVENOUS

## 2019-11-23 MED ORDER — VERAPAMIL HCL 2.5 MG/ML IV SOLN
INTRAVENOUS | Status: DC | PRN
  Administered 2019-11-23: 10 mL via INTRA_ARTERIAL

## 2019-11-23 MED ORDER — HYDRALAZINE HCL 20 MG/ML IJ SOLN: 10.0000 mg | INTRAMUSCULAR | Status: AC | PRN

## 2019-11-23 MED ORDER — CLOPIDOGREL BISULFATE 300 MG PO TABS
ORAL_TABLET | ORAL | Status: AC
  Filled 2019-11-23: qty 2

## 2019-11-23 MED ORDER — MIDAZOLAM HCL 2 MG/2ML IJ SOLN
INTRAMUSCULAR | Status: DC | PRN
  Administered 2019-11-23: 2 mg via INTRAVENOUS
  Administered 2019-11-23: 1 mg via INTRAVENOUS

## 2019-11-23 MED ORDER — LIDOCAINE HCL (PF) 1 % IJ SOLN
INTRAMUSCULAR | Status: DC | PRN
  Administered 2019-11-23: 2 mL

## 2019-11-23 MED ORDER — OMEGA-3-ACID ETHYL ESTERS 1 G PO CAPS: 1.0000 g | ORAL_CAPSULE | Freq: Two times a day (BID) | ORAL | Status: DC

## 2019-11-23 MED ORDER — NITROGLYCERIN 1 MG/10 ML FOR IR/CATH LAB
INTRA_ARTERIAL | Status: DC | PRN
  Administered 2019-11-23 (×3): 150 ug via INTRACORONARY

## 2019-11-23 MED ORDER — VERAPAMIL HCL 2.5 MG/ML IV SOLN
INTRAVENOUS | Status: AC
  Filled 2019-11-23: qty 2

## 2019-11-23 MED ORDER — MIDAZOLAM HCL 2 MG/2ML IJ SOLN
INTRAMUSCULAR | Status: AC
  Filled 2019-11-23: qty 2

## 2019-11-23 MED ORDER — ASPIRIN EC 81 MG PO TBEC
81.0000 mg | DELAYED_RELEASE_TABLET | Freq: Every day | ORAL | Status: DC
  Administered 2019-11-24: 81 mg via ORAL
  Filled 2019-11-23: qty 1

## 2019-11-23 MED ORDER — LIDOCAINE HCL (PF) 1 % IJ SOLN
INTRAMUSCULAR | Status: AC
  Filled 2019-11-23: qty 30

## 2019-11-23 MED ORDER — HEPARIN SODIUM (PORCINE) 5000 UNIT/ML IJ SOLN: 60.0000 [IU]/kg | Freq: Once | INTRAMUSCULAR | Status: DC

## 2019-11-23 MED ORDER — CLOPIDOGREL BISULFATE 75 MG PO TABS
75.0000 mg | ORAL_TABLET | Freq: Every day | ORAL | Status: DC
  Administered 2019-11-24: 75 mg via ORAL
  Filled 2019-11-23: qty 1

## 2019-11-23 MED ORDER — ALBUTEROL SULFATE (2.5 MG/3ML) 0.083% IN NEBU: 3.0000 mL | INHALATION_SOLUTION | Freq: Four times a day (QID) | RESPIRATORY_TRACT | Status: DC | PRN

## 2019-11-23 MED ORDER — HEPARIN SODIUM (PORCINE) 1000 UNIT/ML IJ SOLN
INTRAMUSCULAR | Status: DC | PRN
  Administered 2019-11-23 (×2): 4000 [IU] via INTRAVENOUS
  Administered 2019-11-23: 3000 [IU] via INTRAVENOUS

## 2019-11-23 MED ORDER — LORATADINE 10 MG PO TABS: 10.0000 mg | ORAL_TABLET | Freq: Every day | ORAL | Status: DC

## 2019-11-23 MED ORDER — IOHEXOL 350 MG/ML SOLN
INTRAVENOUS | Status: DC | PRN
  Administered 2019-11-23: 75 mL

## 2019-11-23 MED ORDER — LORATADINE 10 MG PO TABS
10.0000 mg | ORAL_TABLET | Freq: Every day | ORAL | Status: DC
  Administered 2019-11-24: 10 mg via ORAL
  Filled 2019-11-23: qty 1

## 2019-11-23 MED ORDER — ALPRAZOLAM 0.25 MG PO TABS: 0.2500 mg | ORAL_TABLET | Freq: Two times a day (BID) | ORAL | Status: DC | PRN

## 2019-11-23 MED ORDER — GABAPENTIN 100 MG PO CAPS
100.0000 mg | ORAL_CAPSULE | Freq: Every day | ORAL | Status: DC
  Administered 2019-11-24: 100 mg via ORAL
  Filled 2019-11-23: qty 1

## 2019-11-23 MED ORDER — SULFASALAZINE 500 MG PO TBEC
1000.0000 mg | DELAYED_RELEASE_TABLET | Freq: Two times a day (BID) | ORAL | Status: DC
  Administered 2019-11-23 – 2019-11-24 (×2): 1000 mg via ORAL
  Filled 2019-11-23 (×3): qty 2

## 2019-11-23 MED ORDER — HEPARIN BOLUS VIA INFUSION
4000.0000 [IU] | Freq: Once | INTRAVENOUS | Status: AC
  Administered 2019-11-23: 4000 [IU] via INTRAVENOUS

## 2019-11-23 MED ORDER — GABAPENTIN 100 MG PO CAPS: 100.0000 mg | ORAL_CAPSULE | Freq: Every day | ORAL | Status: DC

## 2019-11-23 MED ORDER — LABETALOL HCL 5 MG/ML IV SOLN: 10.0000 mg | INTRAVENOUS | Status: AC | PRN

## 2019-11-23 MED ORDER — SODIUM CHLORIDE 0.9% FLUSH
3.0000 mL | Freq: Two times a day (BID) | INTRAVENOUS | Status: DC
  Administered 2019-11-23: 3 mL via INTRAVENOUS

## 2019-11-23 MED ORDER — CYCLOBENZAPRINE HCL 5 MG PO TABS
5.0000 mg | ORAL_TABLET | Freq: Every day | ORAL | Status: DC
  Administered 2019-11-23: 5 mg via ORAL
  Filled 2019-11-23 (×2): qty 1

## 2019-11-23 SURGICAL SUPPLY — 20 items
BALLN SAPPHIRE 2.0X12 (BALLOONS) ×2
BALLN SAPPHIRE 3.0X12 (BALLOONS) ×2
BALLN SAPPHIRE ~~LOC~~ 2.75X12 (BALLOONS) ×1 IMPLANT
BALLN SAPPHIRE ~~LOC~~ 3.25X10 (BALLOONS) ×1 IMPLANT
BALLOON SAPPHIRE 2.0X12 (BALLOONS) IMPLANT
BALLOON SAPPHIRE 3.0X12 (BALLOONS) IMPLANT
CATH INFINITI JR4 5F (CATHETERS) ×1 IMPLANT
CATH LAUNCHER 6FR EBU3.5 (CATHETERS) ×1 IMPLANT
DEVICE RAD COMP TR BAND LRG (VASCULAR PRODUCTS) ×1 IMPLANT
GLIDESHEATH SLEND SS 6F .021 (SHEATH) ×1 IMPLANT
GUIDEWIRE INQWIRE 1.5J.035X260 (WIRE) IMPLANT
INQWIRE 1.5J .035X260CM (WIRE) ×4
KIT ENCORE 26 ADVANTAGE (KITS) ×1 IMPLANT
KIT HEART LEFT (KITS) ×2 IMPLANT
PACK CARDIAC CATHETERIZATION (CUSTOM PROCEDURE TRAY) ×2 IMPLANT
STENT RESOLUTE ONYX 2.5X18 (Permanent Stent) ×1 IMPLANT
STENT RESOLUTE ONYX 3.0X15 (Permanent Stent) ×1 IMPLANT
TRANSDUCER W/STOPCOCK (MISCELLANEOUS) ×2 IMPLANT
TUBING CIL FLEX 10 FLL-RA (TUBING) ×2 IMPLANT
WIRE COUGAR XT STRL 190CM (WIRE) ×1 IMPLANT

## 2019-11-23 NOTE — H&P (Addendum)
Cardiology Admission History and Physical:   Patient ID: [name]; MRN: [id number]; DOB: 04-05-1960   Admission date: 11/23/2019  Primary Care Provider: Maury Dus, MD Primary Cardiologist: Harold Grooms, MD 08/03/2019 Primary Electrophysiologist: None    Chief Complaint:  STEMI  Patient Profile:   [name] is a 59 y.o. male with a history of BMS LAD 2008, HLD on Repatha, GERD, ankylosing spondylitis, fibromyalgia, elevated PSA and asthma.  History of Present Illness:   [name] was in his USOH this am and was on the treadmill. He had sudden onset of CP. He took a SL NTG X 1 w/out relief. He had mild SOB and some nausea. The symptoms reminded him of his pre-PCI sx.   EMS was called. The FD gave him ASA 324 mg and another SL NTG.They reported his O2 sats as 85% and put him on O2. His CP improved, he feels the O2 made more of a difference than the nitro.   Currently, in the ER, his ECG is improved, but ST/T wave changes are not resolved. He is still having pain, although the pain is improved. Labs have been drawn and COVID swab done, results pending.   Past Medical History:  Diagnosis Date  . Anemia   . Ankylosing spondylitis ([place])   . Arthritis   . Asthma    SINCE AGE 39  . Colitis, ulcerative ([place])   . Coronary artery disease    Bare metal LAD stent 10/08  . Coronary atherosclerosis of native coronary artery 12/25/2012  . ED (erectile dysfunction)   . Elevated prostate specific antigen (PSA) 12/25/2012  . Esophageal reflux 12/25/2012  . Extrinsic asthma, unspecified 12/25/2012  . Fibromyalgia   . GERD (gastroesophageal reflux disease)   . Hypercalcemia 12/25/2012  . Hyperlipidemia   . Left sided ulcerative (chronic) colitis ([place]) 12/25/2012  . Mixed hyperlipidemia 12/25/2012  . Pneumonia    as a child  . Pure hypercholesterolemia 12/25/2012  . TMJ (dislocation of temporomandibular joint)     Past Surgical History:  Procedure Laterality Date  .  COLONOSCOPY    . EYE SURGERY Bilateral   . heart stent    . skin cancer removed from left eyelid     . skull fracture after falling down stairs    . TONSILLECTOMY       Medications Prior to Admission: Prior to Admission medications   Medication Sig Start Date End Date Taking? Authorizing Provider  albuterol (PROVENTIL HFA;VENTOLIN HFA) 108 (90 BASE) MCG/ACT inhaler Inhale 2 puffs into the lungs every 6 (six) hours as needed for wheezing.    [provider]  aspirin 81 MG tablet Take 81 mg by mouth daily.    [provider]  cyclobenzaprine (FLEXERIL) 5 MG tablet Take 5 mg by mouth at bedtime.  12/29/13   [provider]  DOCOSAHEXAENOIC ACID PO Take 1 g by mouth 2 (two) times daily.    [provider]  Evolocumab (REPATHA SURECLICK) 185 MG/ML SOAJ Inject 1 pen into the skin every 14 (fourteen) days. 11/03/19   Harold Booze, MD  ferrous sulfate 325 (65 FE) MG EC tablet Take 325 mg by mouth 2 (two) times daily.    [provider]  gabapentin (NEURONTIN) 100 MG capsule Take 1 capsule by mouth daily. 11/28/16   [provider]  HYDROcodone-acetaminophen (NORCO) 10-325 MG per tablet Take 1-2 tablets by mouth daily.  01/22/14   [provider]  icosapent Ethyl (VASCEPA) 1 g capsule Take  2 capsules (2 g total) by mouth 2 (two) times daily. 10/14/19   Harold Booze, MD  loratadine (CLARITIN) 10 MG tablet Take 10 mg by mouth daily.    [provider]  Multiple Vitamins-Minerals (CENTRUM CARDIO) TABS Take by mouth 2 (two) times daily.    [provider]  nitroGLYCERIN (NITROSTAT) 0.4 MG SL tablet Place 1 tablet (0.4 mg total) under the tongue every 5 (five) minutes as needed for chest pain. 08/03/19   Harold Booze, MD  omeprazole (PRILOSEC) 20 MG capsule Take 20 mg by mouth every other day.    [provider]  predniSONE (DELTASONE) 5 MG tablet Take 5 mg by mouth daily.    [provider]  sulfaSALAzine (AZULFIDINE) 500 MG EC tablet Take 500 mg by mouth. 2 TABLETS BID    [provider]  theophylline (THEODUR) 300 MG 12 hr tablet Take 300 mg by mouth 2 (two) times daily.    [provider]     Allergies:   No Known Allergies  Social History:   Social History   Socioeconomic History  . Marital status: Married    Spouse name: Not on file  . Number of children: Not on file  . Years of education: Not on file  . Highest education level: Not on file  Occupational History  . Not on file  Tobacco Use  . Smoking status: Never Smoker  . Smokeless tobacco: Former Network engineer  . Vaping Use: Never used  Substance and Sexual Activity  . Alcohol use: Yes  . Drug use: Not on file  . Sexual activity: Not on file  Other Topics Concern  . Not on file  Social History Narrative  . Not on file   Social Determinants of Health   Financial Resource Strain:   . Difficulty of Paying Living Expenses: Not on file  Food Insecurity:   . Worried About Charity fundraiser in the Last Year: Not on file  . Ran Out of Food in the Last Year: Not on file  Transportation Needs:   . Lack of Transportation (Medical): Not on file  . Lack of Transportation (Non-Medical): Not on file  Physical Activity:   . Days of Exercise per Week: Not on file  . Minutes of Exercise per Session: Not on file  Stress:   . Feeling of Stress : Not on file  Social Connections:   . Frequency of Communication with Friends and Family: Not on file  . Frequency of Social Gatherings with Friends and Family: Not on file  . Attends Religious Services: Not on file  . Active Member of Clubs or Organizations: Not on file  . Attends Archivist Meetings: Not on file  . Marital Status: Not on file  Intimate Partner Violence:   . Fear of Current or Ex-Partner: Not on file  . Emotionally Abused: Not on file  . Physically Abused: Not on file  . Sexually Abused: Not on file    Family  History:  The patient's family history includes COPD in his maternal grandfather; Emphysema in his maternal grandfather; GER disease in his sister; Hypertension in his mother; Lung cancer in his father.   The patient He indicated that his mother is alive. He indicated that his father is deceased. He indicated that his sister is alive. He indicated that his maternal grandmother is deceased. He indicated that his maternal grandfather is deceased. He indicated that his paternal grandmother is deceased.  He indicated that his paternal grandfather is deceased.  ROS:  Please see the history of present illness.  All other ROS reviewed and negative.     Physical Exam/Data:   Vitals:   11/23/19 1600  BP: 131/86  Pulse: (!) 107  Resp: 15  Temp: 98 F (36.7 C)  TempSrc: Oral  SpO2: 96%   No intake or output data in the 24 hours ending 11/23/19 1632 There were no vitals filed for this visit. There is no height or weight on file to calculate BMI.  General:  Well nourished, well developed, male in no mild-mod distress HEENT: normal for age Lymph: no adenopathy Neck:  JVD not elevated Endocrine:  No thryomegaly Vascular: No carotid bruits; 4/4 extremity pulses 2+ bilaterally  Cardiac:  normal S1, S2; RRR; no murmur, no rub or gallop  Lungs:  clear to auscultation bilaterally, no wheezing, rhonchi or rales  Abd: soft, nontender, no hepatomegaly  Ext: no edema Musculoskeletal:  No deformities, BUE and BLE strength normal and equal Skin: warm and dry  Neuro:  CNs 2-12 intact, no focal abnormalities noted Psych:  Normal affect    EKG:  The ECG was personally reviewed: initial EMS ECG is ST HR 117, PVCs, J point elevation and hyperacute T waves Repeat ECG  Telemetry: SR/ST, PVCs at times  Relevant CV Studies:  CATH: 01/07/2007 FINDINGS:  The left main is widely patent.  The left circumflex is a  medium-sized vessel.  There is a long first obtuse marginal which is  medium size.  There are  minor irregularities throughout.  Both are  widely patent.  The left anterior descending is a large caliber vessel  with 95% midvessel stenosis.  There is a 40-50% diffuse stenosis more  distally in the LAD.  The first diagonal is a medium-sized vessel with  an ostial 25% stenosis.  The second diagonal is a medium-sized vessel.  The third diagonal is a small vessel but patent.  Right coronary artery  is a large dominant vessel.  It appears angiographically normal.  The  left ventriculogram reveals a small area of apical hypokinesis.  Overall  estimated ejection fraction is 55-60%.  Abdominal aortogram shows no  abdominal aortic aneurysm.  There are single renal arteries bilaterally,  both of which are widely patent.   HEMODYNAMIC RESULTS:  Left ventricular pressure of 124/7 with an LVEDP  of 13 mmHg.  Aortic pressure 133/89 with a mean aortic pressure of 110  mmHg.   IMPRESSION:  1. Normal left ventricular function.  2. Single-vessel coronary artery disease in the mid left anterior      descending with a stenosis of 95%.  There is moderate      atherosclerosis throughout the remainder of the vessel.  3. Patent right coronary artery and left circumflex.  4. No renal artery stenosis.  No abdominal aortic aneurysm.   RECOMMENDATIONS:  The patient has severe LAD disease.  He will be loaded  with Plavix.  He has had some pain at rest.  We will admit him and add  Integrilin later if his groin is stable.  Will plan for PCI in the  morning.  The LAD lesion does appear somewhat thrombotic and it is  extremely tight.  In the setting of rest pain, I think it is safer to  keep him in the hospital.  PCI: 01/08/2007  PCI NARRATIVE:  CLS4 guiding catheter was used to intubate the ostium of  the left main.  A [place] wire  was placed in the first diagonal.  A BMW  wire was placed in the LAD.  A 2.5 x 12-mm Voyager balloon was then  inflated to 10 atmospheres for 22 seconds.  This did not  significantly  affect the stenosis.  The balloon was replaced and inflated 12  atmospheres for 28 seconds.  There was improved flow.  A 3.5 x 15-mm  Vision stent was then placed across the lesion and deployed at 14  atmospheres for 51 seconds.  The stent was postdilated with a 4.0 x 12-  mm Quantum Maverick balloon inflated to 16 atmospheres for 43 seconds in  the distal part of the stent, and then 16 atmospheres for 28 seconds in  the more proximal part of the stent.  There is an excellent angiographic  result.  The patient maintained TIMI III flow.  There was no residual  stenosis.   IMPRESSION:  1. Successful bare metal stent placement to the mid LAD with a 3.5 x      15-mm Vision stent postdilated to greater than 4 mm in diameter.  2. Heparin and Integrilin was used.  3. Star close used for hemostasis.   RECOMMENDATIONS:  Continue aspirin and Plavix along with other secondary  prevention.  Will watch the patient overnight.  His aspirin and Plavix  will have to be continued for a minimum of 30 days.   Laboratory Data:  ChemistryNo results for input(s): NA, K, CL, CO2, GLUCOSE, BUN, CREATININE, CALCIUM, GFRNONAA, GFRAA, ANIONGAP in the last 168 hours.  No results for input(s): PROT, ALBUMIN, AST, ALT, ALKPHOS, BILITOT in the last 168 hours. Hematology Recent Labs  Lab 11/23/19 1548  WBC 9.9  RBC 4.40  HGB 13.6  HCT 40.8  MCV 92.7  MCH 30.9  MCHC 33.3  RDW 12.1  PLT 288   Cardiac Enzymes  High Sensitivity Troponin:  No results for input(s): TROPONINIHS in the last 720 hours.   BNPNo results for input(s): BNP, PROBNP in the last 168 hours.  DDimer No results for input(s): DDIMER in the last 168 hours. Lipids:  Lab Results  Component Value Date   CHOL 213 (H) 10/26/2019   HDL 33 (L) 10/26/2019   LDLCALC 154 (H) 10/26/2019   TRIG 143 10/26/2019   CHOLHDL 6.5 (H) 10/26/2019   INR:  Lab Results  Component Value Date   INR 1.0 01/08/2007   A1c:  Lab Results    Component Value Date   HGBA1C 7.1 (H) 11/23/2019   Thyroid: No results found for: TSH, T3TOTAL, T4TOTAL, THYROIDAB  Radiology/Studies:  No results found.  Assessment and Plan:   1. STEMI - ECG and sx c/w acute MI - Dr Burt Knack in to evaluate pt, wife in the room. - emergent cardiac cath indicated, pt taken to the cath lab - pt had ASA 324 mg, SL NTG x 2 and 4000 U heparin prior to going to the lab - screen for CRFs and their control  2. HLD - compliance w/ Repatha encouraged, messages in computer indicate it is being sent to him - f/u w/ [facility] as scheduled   Active Problems:   * No active hospital problems. *     For questions or updates, please contact [place] Please consult [email or web address] for contact info under Cardiology/STEMI.    Signed, Rosaria Ferries, PA-C  11/23/2019 4:32 PM   Patient seen, examined. Available data reviewed. Agree with findings, assessment, and plan as outlined by Rosaria Ferries, PA-C.  The patient is independently  interviewed and examined.  He describes an episode of chest discomfort this morning on the treadmill that made him stop and rest.  He then was able to lift some weights after his chest pain resolved and he got back on the treadmill with no symptoms.  However, upon his return home, he developed resting substernal chest pressure that felt like his prior angina.  EMS was called and an EKG in the field demonstrated anterolateral ST segment elevation.  A code STEMI is called and the patient is evaluated in the emergency department while we are waiting for the call team to arrive.  The patient has mild chest discomfort at the time of my interview, his ST segments have improved.  He describes no associated symptoms with the exception of a "cold sweat" when his symptoms were severe.  On my exam, he is alert, oriented, in no distress.  JVP is normal, carotid upstrokes are normal without bruits, lungs are clear, heart is regular rate and rhythm  with no murmur gallop, abdomen is soft and nontender with positive bowel sounds, extremities have no edema, skin is warm and dry without rash.  EKG from the field showed hyperacute ST segment elevation consistent with acute anterolateral injury.  EKG in the emergency department shows sinus rhythm with resolution of ST segment elevation.  This patient with known coronary artery disease and remote stenting of the LAD (bare-metal stent) presents with an acute anterior STEMI with resolution of ST segment elevation.  He is at high risk of recurrent coronary occlusion and myocardial infarction.  We will proceed emergently with cardiac catheterization and possible PCI.  Discussed plan with patient and his wife who is at the bedside.  Both understand and agree to proceed.  Further plans/disposition pending cardiac catheterization results.  Note the patient has not been taking aspirin because of epistaxis.  He was given aspirin 324 mg by EMS prior to arrival here.  Sherren Mocha, M.D. 11/23/2019 5:38 PM

## 2019-11-23 NOTE — ED Provider Notes (Signed)
[facility] EMERGENCY DEPARTMENT Provider Note   CSN: [id number] Arrival date & time: 11/23/19  1541     History Chief Complaint  Patient presents with  . Chest Pain    [name] is a 59 y.o. male.  59 year old male with past medical history including CAD s/p stent in 2008, ulcerative colitis, ankylosing spondylitis, hyperlipidemia, GERD, fibromyalgia who presents with chest pain.  Earlier today prior to arrival, patient was at the gym working out on the treadmill when he began having central, nonradiating chest pain.  He went home and took a nitroglycerin but continued to have chest pain and he eventually called EMS.  He notes that once he got home, he began having diaphoresis and has had some mild shortness of breath with his pain.  He was given aspirin and another nitroglycerin by EMS and his pain has since improved to very mild pain currently.  He denies any cough/cold symptoms, recent travel, or recent illness.  This pain feels similar to the pain that he had before his previous stent.  He is compliant with medications.  Denies any anticoagulant use.  Denies any problems with bleeding.  The history is provided by the patient.  Chest Pain      Past Medical History:  Diagnosis Date  . Anemia   . Ankylosing spondylitis ([place])   . Arthritis   . Asthma    SINCE AGE 78  . Colitis, ulcerative ([place])   . Coronary artery disease    Bare metal LAD stent 10/08  . Coronary atherosclerosis of native coronary artery 12/25/2012  . ED (erectile dysfunction)   . Elevated prostate specific antigen (PSA) 12/25/2012  . Esophageal reflux 12/25/2012  . Extrinsic asthma, unspecified 12/25/2012  . Fibromyalgia   . GERD (gastroesophageal reflux disease)   . Hypercalcemia 12/25/2012  . Hyperlipidemia   . Left sided ulcerative (chronic) colitis ([place]) 12/25/2012  . Mixed hyperlipidemia 12/25/2012  . Pneumonia    as a child  . Pure hypercholesterolemia 12/25/2012  . TMJ  (dislocation of temporomandibular joint)     Patient Active Problem List   Diagnosis Date Noted  . Coronary artery disease   . Hyperlipidemia   . Pure hypercholesterolemia 12/25/2012  . Coronary atherosclerosis of native coronary artery 12/25/2012  . Iron deficiency anemia, unspecified 12/25/2012  . Anemia, unspecified 12/25/2012  . Allergic rhinitis, cause unspecified 12/25/2012  . Esophageal reflux 12/25/2012  . Left sided ulcerative (chronic) colitis ([place]) 12/25/2012  . Pain in joint, pelvic region and thigh 12/25/2012  . Other and unspecified hyperlipidemia 12/25/2012  . Other specified conditions influencing health status(V49.89) 12/25/2012  . Elevated prostate specific antigen (PSA) 12/25/2012  . Extrinsic asthma, unspecified 12/25/2012  . Hypercalcemia 12/25/2012  . Mixed hyperlipidemia 12/25/2012  . Ankylosing spondylitis ([place]) 12/25/2012    Past Surgical History:  Procedure Laterality Date  . COLONOSCOPY    . EYE SURGERY Bilateral   . heart stent    . skin cancer removed from left eyelid     . skull fracture after falling down stairs    . TONSILLECTOMY         Family History  Problem Relation Age of Onset  . Hypertension Mother   . Lung cancer Father   . GER disease Sister   . Emphysema Maternal Grandfather   . COPD Maternal Grandfather     Social History   Tobacco Use  . Smoking status: Never Smoker  . Smokeless tobacco: Former Network engineer  .  Vaping Use: Never used  Substance Use Topics  . Alcohol use: Yes  . Drug use: Not on file    Home Medications Prior to Admission medications   Medication Sig Start Date End Date Taking? Authorizing Provider  albuterol (PROVENTIL HFA;VENTOLIN HFA) 108 (90 BASE) MCG/ACT inhaler Inhale 2 puffs into the lungs every 6 (six) hours as needed for wheezing.    [provider]  aspirin 81 MG tablet Take 81 mg by mouth daily.    [provider]  cyclobenzaprine (FLEXERIL) 5 MG tablet Take 5 mg  by mouth at bedtime.  12/29/13   [provider]  DOCOSAHEXAENOIC ACID PO Take 1 g by mouth 2 (two) times daily.    [provider]  Evolocumab (REPATHA SURECLICK) 010 MG/ML SOAJ Inject 1 pen into the skin every 14 (fourteen) days. 11/03/19   Jettie Booze, MD  ferrous sulfate 325 (65 FE) MG EC tablet Take 325 mg by mouth 2 (two) times daily.    [provider]  gabapentin (NEURONTIN) 100 MG capsule Take 1 capsule by mouth daily. 11/28/16   [provider]  HYDROcodone-acetaminophen (NORCO) 10-325 MG per tablet Take 1-2 tablets by mouth daily.  01/22/14   [provider]  icosapent Ethyl (VASCEPA) 1 g capsule Take 2 capsules (2 g total) by mouth 2 (two) times daily. 10/14/19   Jettie Booze, MD  loratadine (CLARITIN) 10 MG tablet Take 10 mg by mouth daily.    [provider]  Multiple Vitamins-Minerals (CENTRUM CARDIO) TABS Take by mouth 2 (two) times daily.    [provider]  nitroGLYCERIN (NITROSTAT) 0.4 MG SL tablet Place 1 tablet (0.4 mg total) under the tongue every 5 (five) minutes as needed for chest pain. 08/03/19   Jettie Booze, MD  omeprazole (PRILOSEC) 20 MG capsule Take 20 mg by mouth every other day.    [provider]  predniSONE (DELTASONE) 5 MG tablet Take 5 mg by mouth daily.    [provider]  sulfaSALAzine (AZULFIDINE) 500 MG EC tablet Take 500 mg by mouth. 2 TABLETS BID    [provider]  theophylline (THEODUR) 300 MG 12 hr tablet Take 300 mg by mouth 2 (two) times daily.    [provider]    Allergies    Patient has no known allergies.  Review of Systems   Review of Systems  Cardiovascular: Positive for chest pain.   All other systems reviewed and are negative except that which was mentioned in HPI  Physical Exam Updated Vital Signs There were no vitals taken for this visit.  Physical Exam Vitals and nursing note reviewed.  Constitutional:       General: He is not in acute distress.    Appearance: He is well-developed.  HENT:     Head: Normocephalic and atraumatic.  Eyes:     Conjunctiva/sclera: Conjunctivae normal.  Cardiovascular:     Rate and Rhythm: Normal rate and regular rhythm.     Heart sounds: Normal heart sounds. No murmur heard.   Pulmonary:     Effort: Pulmonary effort is normal.     Breath sounds: Normal breath sounds.  Abdominal:     General: Bowel sounds are normal. There is no distension.     Palpations: Abdomen is soft.     Tenderness: There is no abdominal tenderness.  Musculoskeletal:     Cervical back: Neck supple.     Right lower leg: No tenderness. No edema.  Left lower leg: No tenderness. No edema.  Skin:    General: Skin is warm and dry.  Neurological:     Mental Status: He is alert and oriented to person, place, and time.     Comments: Fluent speech  Psychiatric:        Judgment: Judgment normal.     ED Results / Procedures / Treatments   Labs (all labs ordered are listed, but only abnormal results are displayed) Labs Reviewed  CBC WITH DIFFERENTIAL/PLATELET - Abnormal; Notable for the following components:      Result Value   Basophils Absolute 0.2 (*)    All other components within normal limits  SARS CORONAVIRUS 2 BY RT PCR (HOSPITAL ORDER, [facility])  HEMOGLOBIN A1C  PROTIME-INR  APTT  COMPREHENSIVE METABOLIC PANEL  LIPID PANEL  TROPONIN I (HIGH SENSITIVITY)    EKG EKG Interpretation  Date/Time:  Monday November 23 2019 15:48:23 EDT Ventricular Rate:  111 PR Interval:    QRS Duration: 106 QT Interval:  341 QTC Calculation: 464 R Axis:   110 Text Interpretation: Sinus tachycardia Right axis deviation Abnormal R-wave progression, early transition rate faster than previous Confirmed by Theotis Burrow [phone]) on 11/23/2019 3:56:08 PM   Radiology No results found.  Procedures .Critical Care Performed by: Sharlett Iles,  MD Authorized by: Sharlett Iles, MD   Critical care provider statement:    Critical care time (minutes):  30   Critical care time was exclusive of:  Separately billable procedures and treating other patients   Critical care was necessary to treat or prevent imminent or life-threatening deterioration of the following conditions:  Cardiac failure   Critical care was time spent personally by me on the following activities:  Development of treatment plan with patient or surrogate, discussions with consultants, examination of patient, obtaining history from patient or surrogate, ordering and performing treatments and interventions, ordering and review of laboratory studies, re-evaluation of patient's condition and review of old charts   (including critical care time)  Medications Ordered in ED Medications  0.9 %  sodium chloride infusion (has no administration in time range)  heparin bolus via infusion 4,000 Units (4,000 Units Intravenous Bolus from Bag 11/23/19 1604)  sodium chloride 0.9 % bolus 500 mL (500 mLs Intravenous New Bag/Given 11/23/19 1604)    ED Course  I have reviewed the triage vital signs and the nursing notes.  Pertinent labs & imaging results that were available during my care of the patient were reviewed by me and considered in my medical decision making (see chart for details).    MDM Rules/Calculators/A&P                          Code STEMI was called in route by EMS.  EMS EKG shows hyperacute T waves in precordial leads concerning for ischemia.  EKG upon arrival here when chest pain had almost resolved showed sinus tachycardia without acute ischemic changes compared to previous.  Dr. Burt Knack at bedside.  Because of dynamic changes and very concerning story, he will take the patient to the Cath Lab.  Patient given heparin bolus prior to procedure. Final Clinical Impression(s) / ED Diagnoses Final diagnoses:  ST elevation myocardial infarction (STEMI), unspecified  artery [facility]    Rx / DC Orders ED Discharge Orders    None       Veasna Santibanez, Wenda Overland, MD 11/23/19 [phone]

## 2019-11-23 NOTE — Plan of Care (Signed)

## 2019-11-23 NOTE — ED Triage Notes (Signed)
Pt here with cp that started at the gym. Given asa and 2 nitro before arrival.

## 2019-11-24 ENCOUNTER — Encounter (HOSPITAL_COMMUNITY): Payer: Self-pay | Admitting: Cardiovascular Disease

## 2019-11-24 ENCOUNTER — Inpatient Hospital Stay (HOSPITAL_COMMUNITY): Payer: PRIVATE HEALTH INSURANCE

## 2019-11-24 DIAGNOSIS — R079 Chest pain, unspecified: Secondary | ICD-10-CM

## 2019-11-24 DIAGNOSIS — E118 Type 2 diabetes mellitus with unspecified complications: Secondary | ICD-10-CM

## 2019-11-24 LAB — COMPREHENSIVE METABOLIC PANEL
ALT: 20 U/L (ref 0–44)
AST: 38 U/L (ref 15–41)
Albumin: 3.5 g/dL (ref 3.5–5.0)
Alkaline Phosphatase: 71 U/L (ref 38–126)
Anion gap: 9 (ref 5–15)
BUN: 8 mg/dL (ref 6–20)
CO2: 23 mmol/L (ref 22–32)
Calcium: 9.1 mg/dL (ref 8.9–10.3)
Chloride: 105 mmol/L (ref 98–111)
Creatinine, Ser: 0.59 mg/dL — ABNORMAL LOW (ref 0.61–1.24)
GFR calc Af Amer: >60 mL/min (ref >60)
GFR calc non Af Amer: >60 mL/min (ref >60)
Glucose, Bld: 157 mg/dL — ABNORMAL HIGH (ref 70–99)
Potassium: 3.5 mmol/L (ref 3.5–5.1)
Sodium: 137 mmol/L (ref 135–145)
Total Bilirubin: 0.5 mg/dL (ref 0.3–1.2)
Total Protein: 6.7 g/dL (ref 6.5–8.1)

## 2019-11-24 LAB — MRSA PCR SCREENING: MRSA by PCR: NEGATIVE

## 2019-11-24 LAB — CBC
HCT: 40.1 % (ref 39.0–52.0)
Hemoglobin: 13.3 g/dL (ref 13.0–17.0)
MCH: 30.5 pg (ref 26.0–34.0)
MCHC: 33.2 g/dL (ref 30.0–36.0)
MCV: 92 fL (ref 80.0–100.0)
Platelets: 245 10*3/uL (ref 150–400)
RBC: 4.36 MIL/uL (ref 4.22–5.81)
RDW: 12.2 % (ref 11.5–15.5)
WBC: 9.4 10*3/uL (ref 4.0–10.5)
nRBC: 0 % (ref 0.0–0.2)

## 2019-11-24 LAB — LIPID PANEL
Cholesterol: 159 mg/dL (ref 0–200)
HDL: 36 mg/dL — ABNORMAL LOW (ref >40)
LDL Cholesterol: 97 mg/dL (ref 0–99)
Total CHOL/HDL Ratio: 4.4 RATIO
Triglycerides: 130 mg/dL (ref <150)
VLDL: 26 mg/dL (ref 0–40)

## 2019-11-24 LAB — ECHOCARDIOGRAM COMPLETE
Area-P 1/2: 3.63 cm2
Height: 69 in
P 1/2 time: 468 msec
S' Lateral: 3.1 cm
Weight: 2860.69 oz

## 2019-11-24 LAB — POCT ACTIVATED CLOTTING TIME
Activated Clotting Time: 230 seconds
Activated Clotting Time: 274 seconds

## 2019-11-24 MED ORDER — METOPROLOL SUCCINATE ER 25 MG PO TB24
25.0000 mg | ORAL_TABLET | Freq: Every day | ORAL | Status: DC
  Administered 2019-11-24: 25 mg via ORAL
  Filled 2019-11-24: qty 1

## 2019-11-24 MED ORDER — METOPROLOL SUCCINATE ER 25 MG PO TB24: 25.0000 mg | ORAL_TABLET | Freq: Every day | ORAL | 2 refills | Status: DC

## 2019-11-24 MED ORDER — CLOPIDOGREL BISULFATE 75 MG PO TABS: 75.0000 mg | ORAL_TABLET | Freq: Every day | ORAL | 11 refills | Status: DC

## 2019-11-24 MED ORDER — POTASSIUM CHLORIDE ER 10 MEQ PO TBCR
40.0000 meq | EXTENDED_RELEASE_TABLET | Freq: Every day | ORAL | Status: DC
  Administered 2019-11-24: 40 meq via ORAL
  Filled 2019-11-24 (×2): qty 4

## 2019-11-24 MED ORDER — PANTOPRAZOLE SODIUM 40 MG PO TBEC: 40.0000 mg | DELAYED_RELEASE_TABLET | Freq: Every day | ORAL | 2 refills | Status: DC

## 2019-11-24 MED ORDER — PREDNISONE 1 MG PO TABS
3.0000 mg | ORAL_TABLET | Freq: Every day | ORAL | Status: DC
  Filled 2019-11-24: qty 3

## 2019-11-24 MED ORDER — NITROGLYCERIN 0.4 MG SL SUBL: 0.4000 mg | SUBLINGUAL_TABLET | SUBLINGUAL | 3 refills | Status: DC | PRN

## 2019-11-24 MED ORDER — OXYCODONE HCL 5 MG PO TABS: 5.0000 mg | ORAL_TABLET | ORAL | Status: DC | PRN

## 2019-11-24 MED ORDER — MORPHINE SULFATE (PF) 2 MG/ML IV SOLN: 2.0000 mg | INTRAVENOUS | Status: DC | PRN

## 2019-11-24 MED ORDER — CHLORHEXIDINE GLUCONATE CLOTH 2 % EX PADS: 6.0000 | MEDICATED_PAD | Freq: Every day | CUTANEOUS | Status: DC

## 2019-11-24 MED ORDER — METOPROLOL SUCCINATE ER 25 MG PO TB24: 12.5000 mg | ORAL_TABLET | Freq: Every day | ORAL | Status: DC

## 2019-11-24 MED FILL — Heparin Sod (Porcine)-NaCl IV Soln 1000 Unit/500ML-0.9%: INTRAVENOUS | Qty: 1500 | Status: AC

## 2019-11-24 NOTE — Progress Notes (Signed)
  Echocardiogram 2D Echocardiogram has been performed.  Nikola Marone G Marise Knapper 11/24/2019, 2:00 PM

## 2019-11-24 NOTE — Discharge Summary (Addendum)
Discharge Summary    Patient ID: [name] MRN: [id number]; DOB: 1960-10-15  Admit date: 11/23/2019 Discharge date: 11/24/2019  Primary Care Provider: Maury Dus, MD  Primary Cardiologist: Larae Grooms, MD  Primary Electrophysiologist:  None   Discharge Diagnoses    Principal Problem:   STEMI (ST elevation myocardial infarction) [facility]) Active Problems:   Coronary artery disease   GERD (gastroesophageal reflux disease)   Ankylosing spondylitis ([place])   Hyperlipidemia LDL goal <70   Type 2 diabetes mellitus with complication, without long-term current use of insulin [facility])    Diagnostic Studies/Procedures    Left Heart Catheterization 11/23/2019: 2nd Sept lesion is 90% stenosed.   1.  Critical stenosis of the mid LAD and patient with anterior STEMI, treated successfully with PCI of the mid LAD using a drug-eluting stent (2.5 x 18 mm resolute Onyx) overlapped with the old bare-metal stent which remained patent. 2.  Severe first diagonal stenosis treated successfully with a 3.0 x 15 mm resolute Onyx DES 3.  Diffuse distal vessel coronary artery disease involving the LAD and PDA 4.  Mild nonobstructive left mainstem and left circumflex stenosis   Recommendations: 2D echo for LVEF assessment, aggressive medical therapy for secondary risk reduction, initially favor medical therapy for severe PDA stenosis because the vessel is diffusely diseased with multiple lesions throughout.  If refractory angina would be reasonable to treat the proximal PDA lesion with stenting.  Will review with interventional colleagues.    Pending echo assessment the patient could be considered for early discharge within 24 hours if no recurrent angina and LVEF preserved.  Diagnostic Dominance: Right  Intervention    _____________   Echocardiogram 11/24/2019: Impressions:  1. Left ventricular ejection fraction, by estimation, is 55 to 60%. The  left ventricle has normal function. The left  ventricle demonstrates  regional wall motion abnormalities (see scoring diagram/findings for  description). Left ventricular diastolic  parameters are consistent with Grade I diastolic dysfunction (impaired  relaxation). There is akinesis of the left ventricular, apical septal wall  and inferior wall. There is akinesis of the left ventricular, apical  segment.   2. Right ventricular systolic function is normal. The right ventricular  size is normal.   3. The mitral valve is normal in structure. No evidence of mitral valve  regurgitation. No evidence of mitral stenosis.   4. The aortic valve is normal in structure. Aortic valve regurgitation is  trivial. No aortic stenosis is present.   5. The inferior vena cava is normal in size with greater than 50%  respiratory variability, suggesting right atrial pressure of 3 mmHg.  History of Present Illness     [name] is a 59 y.o. male with a history of CAD s/p BMS to LAD in 2008, hyperlipidemia on Repatha, fibromyalgia, ankylosing spondylitis followed by Rheumatology, asthma, and GERD who was admitted on 11/23/2019 for STEMI. Patient was in his usual state of health until morning of presentation when he developed sudden onset of chest pain while on treadmill. He had associated mild shortness of breath and some nausea with the pain. Symptoms similar to pre-PCI symptoms. He took one dose of sublingual Nitro without relief.   EMS was called. The [employer] gave patient Aspirin 325mg  daily and another sublingual Nitro. O2 sats reportedly 85% upon arrival of [employer] so he was placed on O2. His chest pain improved with this - patient felt like O2 made more of a difference than the Nitro. EKG in the field concerning for  anterolateral STEMI so code STEMI called.   Upon arrival to the ED, EKG showed resolution of ST elevations. However, he was still having chest pain. Therefore, patient taken to cath lab for emergent cardiac catheterization.     Hospital Course     Consultants: None.   STEMI Patient admitted with STEMI as stated above. High-sensitivity troponin elevated at 60 >> 570 >> 1,468. Emergent cardiac catheterization showed critical stenosis of mid LAD as well as severe stenosis of 1st Diagonal. Patient underwent successful PCI with DES to LAD and DES to 1st Diag lesion. Echo showed LVEF of 55-60% with akinesis of the apical septal wall, inferior wall, and apical segment as well as grade 1 diastolic dysfunction. Patient tolerated procedure well with no recurrent chest pain. Patient started on DAPT with Aspirin 81mg  and Plavix 75mg  daily. Toprol-XL 25mg  daily also added. Patient on Repatha at home. No statin due to elevated LFTs with statins in the past.   Hyperlipidemia Patient had two lipid panel checked this admission. LDL 72 on one check and 97 on another. Continue Repatha (he has only taken 1 dose as an outpatient).   Type 2 Diabetes Mellitus Hemoglobin A1c 7.1% this admission. Not on any medications at home. Patient to follow-up with PCP to further address this.  GERD Will discontinue Omeprazole and start Protonix 40mg  daily given need for Plavix.  Ankylosing Spondylitis Patient followed by Rheumatology (Dr. Amil Amen) and on Prednisone and Norco. Reviewed [place] Controlled Substance data base and no concerning prescription history. Norco only prescribed by Dr. Melissa Noon office.    Patient seen and examined by Dr. Gwenlyn Found today and determined to be stable for discharge. Outpatient follow-up has been arranged. Medications as below.  Did the patient have an acute coronary syndrome (MI, NSTEMI, STEMI, etc) this admission?:  Yes                               AHA/ACC Clinical Performance & Quality Measures: Aspirin prescribed? - Yes ADP Receptor Inhibitor (Plavix/Clopidogrel, Brilinta/Ticagrelor or Effient/Prasugrel) prescribed (includes medically managed patients)? - Yes Beta Blocker prescribed? - Yes High Intensity Statin  (Lipitor 40-80mg  or Crestor 20-40mg ) prescribed? - No - history of elevated LFTs while on statin. EF assessed during THIS hospitalization? - Yes For EF <40%, was ACEI/ARB prescribed? - Not Applicable (EF >/= 28%) For EF <40%, Aldosterone Antagonist (Spironolactone or Eplerenone) prescribed? - Not Applicable (EF >/= 78%) Cardiac Rehab Phase II ordered (including medically managed patients)? - Yes   _____________  Discharge Vitals Blood pressure 116/87, pulse 92, temperature 97.7 F (36.5 C), temperature source Oral, resp. rate (!) 24, height 5\' 9"  (1.753 m), weight 81.1 kg, SpO2 96 %.  Filed Weights   11/23/19 1900 11/24/19 0500  Weight: 81.6 kg 81.1 kg    Labs & Radiologic Studies    CBC Recent Labs    11/23/19 1548 11/23/19 1548 11/23/19 1847 11/24/19 0217  WBC 9.9   < > 11.3* 9.4  NEUTROABS 7.0  --   --   --   HGB 13.6   < > 13.7 13.3  HCT 40.8   < > 41.5 40.1  MCV 92.7   < > 93.3 92.0  PLT 288   < > 274 245   < > = values in this interval not displayed.   Basic Metabolic Panel Recent Labs    11/23/19 1548 11/24/19 0217  NA 137 137  K 3.8 3.5  CL 103 105  CO2 22 23  GLUCOSE 285* 157*  BUN 13 8  CREATININE 0.97 0.59*  CALCIUM 9.4 9.1   Liver Function Tests Recent Labs    11/23/19 1548 11/24/19 0217  AST 21 38  ALT 19 20  ALKPHOS 74 71  BILITOT 0.8 0.5  PROT 6.7 6.7  ALBUMIN 3.5 3.5   No results for input(s): LIPASE, AMYLASE in the last 72 hours. High Sensitivity Troponin:   Recent Labs  Lab 11/23/19 1548 11/23/19 1847 11/23/19 2034  TROPONINIHS 60* 570* 1,468*    BNP Invalid input(s): POCBNP D-Dimer No results for input(s): DDIMER in the last 72 hours. Hemoglobin A1C Recent Labs    11/23/19 1847  HGBA1C 7.1*   Fasting Lipid Panel Recent Labs    11/24/19 0217  CHOL 159  HDL 36*  LDLCALC 97  TRIG 130  CHOLHDL 4.4   Thyroid Function Tests Recent Labs    11/23/19 1847  TSH 1.411   _____________  CARDIAC  CATHETERIZATION  Result Date: 11/23/2019  2nd Sept lesion is 90% stenosed.  1.  Critical stenosis of the mid LAD and patient with anterior STEMI, treated successfully with PCI of the mid LAD using a drug-eluting stent (2.5 x 18 mm resolute Onyx) overlapped with the old bare-metal stent which remained patent. 2.  Severe first diagonal stenosis treated successfully with a 3.0 x 15 mm resolute Onyx DES 3.  Diffuse distal vessel coronary artery disease involving the LAD and PDA 4.  Mild nonobstructive left mainstem and left circumflex stenosis Recommendations: 2D echo for LVEF assessment, aggressive medical therapy for secondary risk reduction, initially favor medical therapy for severe PDA stenosis because the vessel is diffusely diseased with multiple lesions throughout.  If refractory angina would be reasonable to treat the proximal PDA lesion with stenting.  Will review with interventional colleagues. Pending echo assessment the patient could be considered for early discharge within 24 hours if no recurrent angina and LVEF preserved.   DG Chest Port 1 View  Result Date: 11/23/2019 CLINICAL DATA:  Chest pain EXAM: PORTABLE CHEST 1 VIEW COMPARISON:  12/04/2006 FINDINGS: Apical lordotic positioning. Midline trachea. Normal heart size. Atherosclerosis in the transverse aorta. No pleural effusion or pneumothorax. Hyperinflation. Clear lungs. Remote bilateral rib fractures. Mild right hemidiaphragm elevation. IMPRESSION: Hyperinflation, without acute disease. Electronically Signed   By: Abigail Miyamoto M.D.   On: 11/23/2019 19:44   ECHOCARDIOGRAM COMPLETE  Result Date: 11/24/2019    ECHOCARDIOGRAM REPORT   Patient Name:   [name] Date of Exam: 11/24/2019 Medical Rec #:  [id number]        Height:       69.0 in Accession #:    [id number]       Weight:       178.8 lb Date of Birth:  1960-07-05        BSA:          1.970 m Patient Age:    22 years         BP:           116/87 mmHg Patient Gender: M                 HR:           93 bpm. Exam Location:  Inpatient Procedure: 2D Echo, Cardiac Doppler and Color Doppler Indications:    R07.9* Chest pain, unspecified  History:        Patient has no prior history of Echocardiogram examinations.  CAD, TIA; Risk Factors:Hypertension, Dyslipidemia and GERD.  Sonographer:    Jonelle Sidle Dance Referring Phys: [place]  1. Left ventricular ejection fraction, by estimation, is 55 to 60%. The left ventricle has normal function. The left ventricle demonstrates regional wall motion abnormalities (see scoring diagram/findings for description). Left ventricular diastolic parameters are consistent with Grade I diastolic dysfunction (impaired relaxation). There is akinesis of the left ventricular, apical septal wall and inferior wall. There is akinesis of the left ventricular, apical segment.  2. Right ventricular systolic function is normal. The right ventricular size is normal.  3. The mitral valve is normal in structure. No evidence of mitral valve regurgitation. No evidence of mitral stenosis.  4. The aortic valve is normal in structure. Aortic valve regurgitation is trivial. No aortic stenosis is present.  5. The inferior vena cava is normal in size with greater than 50% respiratory variability, suggesting right atrial pressure of 3 mmHg. FINDINGS  Left Ventricle: Left ventricular ejection fraction, by estimation, is 55 to 60%. The left ventricle has normal function. The left ventricle demonstrates regional wall motion abnormalities. The left ventricular internal cavity size was normal in size. There is no left ventricular hypertrophy. Left ventricular diastolic parameters are consistent with Grade I diastolic dysfunction (impaired relaxation). Normal left ventricular filling pressure. Right Ventricle: The right ventricular size is normal. No increase in right ventricular wall thickness. Right ventricular systolic function is normal. Left Atrium: Left atrial  size was normal in size. Right Atrium: Right atrial size was normal in size. Pericardium: There is no evidence of pericardial effusion. Mitral Valve: The mitral valve is normal in structure. Normal mobility of the mitral valve leaflets. No evidence of mitral valve regurgitation. No evidence of mitral valve stenosis. Tricuspid Valve: The tricuspid valve is normal in structure. Tricuspid valve regurgitation is not demonstrated. No evidence of tricuspid stenosis. Aortic Valve: The aortic valve is normal in structure. Aortic valve regurgitation is trivial. Aortic regurgitation PHT measures 468 msec. No aortic stenosis is present. Pulmonic Valve: The pulmonic valve was normal in structure. Pulmonic valve regurgitation is not visualized. No evidence of pulmonic stenosis. Aorta: The aortic root is normal in size and structure. Venous: The inferior vena cava is normal in size with greater than 50% respiratory variability, suggesting right atrial pressure of 3 mmHg. IAS/Shunts: No atrial level shunt detected by color flow Doppler.  LEFT VENTRICLE PLAX 2D LVIDd:         3.70 cm  Diastology LVIDs:         3.10 cm  LV e' lateral:   8.59 cm/s LV PW:         0.90 cm  LV E/e' lateral: 7.8 LV IVS:        0.80 cm LVOT diam:     2.10 cm LV SV:         61 LV SV Index:   31 LVOT Area:     3.46 cm  RIGHT VENTRICLE            IVC RV Basal diam:  2.40 cm    IVC diam: 1.30 cm RV S prime:     7.72 cm/s TAPSE (M-mode): 1.3 cm LEFT ATRIUM             Index       RIGHT ATRIUM           Index LA diam:        2.80 cm 1.42 cm/m  RA Area:  12.50 cm LA Vol (A2C):   36.9 ml 18.73 ml/m RA Volume:   28.80 ml  14.62 ml/m LA Vol (A4C):   35.3 ml 17.92 ml/m LA Biplane Vol: 37.6 ml 19.09 ml/m  AORTIC VALVE LVOT Vmax:   86.70 cm/s LVOT Vmean:  64.900 cm/s LVOT VTI:    0.175 m AI PHT:      468 msec  AORTA Ao Root diam: 4.00 cm MITRAL VALVE MV Area (PHT): 3.63 cm     SHUNTS MV Decel Time: 209 msec     Systemic VTI:  0.18 m MV E velocity: 66.70  cm/s   Systemic Diam: 2.10 cm MV A velocity: 113.00 cm/s MV E/A ratio:  0.59 Fransico Him MD Electronically signed by Fransico Him MD Signature Date/Time: 11/24/2019/2:14:48 PM    Final    Disposition   Patient is being discharged home today in good condition.  Follow-up Plans & Appointments     Follow-up Information     Liliane Shi, PA-C Follow up.   Specialties: Cardiology, Physician Assistant Why: Hospital follow-up scheduled for 12/02/2019 at 11:45am with Richardson Dopp, one of Dr. Hassell Done PAs. Please arrive 15 minutes early for check-in. If this date/time does not work for you, please call our office to reschedule. Contact information: [address] [place] [address] [phone]         Maury Dus, MD Follow up.   Specialty: Family Medicine Why: Please call your primary care physician's office as soon as possible to schedule follow-up visit within the next 2-3 weeks. Contact information: [place] [address] [phone]                Discharge Instructions     Amb Referral to Cardiac Rehabilitation   Complete by: As directed    Diagnosis:  Coronary Stents STEMI PTCA     After initial evaluation and assessments completed: Virtual Based Care may be provided alone or in conjunction with Phase 2 Cardiac Rehab based on patient barriers.: Yes   Diet - low sodium heart healthy   Complete by: As directed    Increase activity slowly   Complete by: As directed        Discharge Medications   Allergies as of 11/24/2019   No Known Allergies      Medication List     STOP taking these medications    DOCOSAHEXAENOIC ACID PO   omeprazole 20 MG capsule Commonly known as: PRILOSEC Replaced by: pantoprazole 40 MG tablet   Sudafed Sinus Congestion 24HR 240 MG Tb24 Generic drug: Pseudoephedrine HCl       TAKE these medications    albuterol 108 (90 Base) MCG/ACT inhaler Commonly known as:  VENTOLIN HFA Inhale 2 puffs into the lungs every 6 (six) hours as needed for wheezing.   aspirin 81 MG tablet Take 81 mg by mouth daily.   Centrum Cardio Tabs Take 1 tablet by mouth 2 (two) times daily.   clopidogrel 75 MG tablet Commonly known as: PLAVIX Take 1 tablet (75 mg total) by mouth daily with breakfast. Start taking on: November 25, 2019   cyclobenzaprine 5 MG tablet Commonly known as: FLEXERIL Take 5 mg by mouth at bedtime.   ferrous sulfate 325 (65 FE) MG EC tablet Take 325 mg by mouth daily with breakfast.   gabapentin 100 MG capsule Commonly known as: NEURONTIN Take 200 mg by mouth daily.   HYDROcodone-acetaminophen 10-325 MG tablet Commonly known as: NORCO Take  0.5-1 tablets by mouth every 6 (six) hours as needed for severe pain.   icosapent Ethyl 1 g capsule Commonly known as: Vascepa Take 2 capsules (2 g total) by mouth 2 (two) times daily.   loratadine 10 MG tablet Commonly known as: CLARITIN Take 10 mg by mouth daily as needed for allergies.   metoprolol succinate 25 MG 24 hr tablet Commonly known as: TOPROL-XL Take 1 tablet (25 mg total) by mouth daily. Start taking on: November 25, 2019   nitroGLYCERIN 0.4 MG SL tablet Commonly known as: NITROSTAT Place 1 tablet (0.4 mg total) under the tongue every 5 (five) minutes as needed for chest pain.   pantoprazole 40 MG tablet Commonly known as: PROTONIX Take 1 tablet (40 mg total) by mouth at bedtime. Replaces: omeprazole 20 MG capsule   predniSONE 1 MG tablet Commonly known as: DELTASONE Take 3 mg by mouth daily.   Repatha SureClick 657 MG/ML Soaj Generic drug: Evolocumab Inject 1 pen into the skin every 14 (fourteen) days.   sulfaSALAzine 500 MG EC tablet Commonly known as: AZULFIDINE Take 1,000 mg by mouth 2 (two) times daily.   theophylline 300 MG 12 hr tablet Commonly known as: THEODUR Take 300 mg by mouth 2 (two) times daily.           Outstanding Labs/Studies    N/A.  Duration of Discharge Encounter   Greater than 30 minutes including physician time.  Signed, Darreld Mclean, PA-C 11/24/2019, 2:36 PM  Agree with note by Sande Rives, PA-C  [name].  presented yesterday with anterior STEMI.  He underwent urgent catheterization by Dr. Burt Knack stenting his mid LAD after previously placed bare-metal stent in 2008 as well as stenting his first diagonal branch.  He did have diffuse PDA disease and distal LAD disease.  His EF was preserved by 2D echo.  He said no recurrent symptoms.  He is on dual antiplatelet therapy as well as guideline directed optimal medical therapy.  He ambulated in unit 2H without difficulties.  He stable for discharge home today.  We will arrange TOC 7 and follow-up with Dr. Irish Lack.   Lorretta Harp, M.D., [place], [facility], [place] [address] [place], [place]  [address]  [phone] 11/24/2019 3:36 PM

## 2019-11-24 NOTE — Progress Notes (Signed)
Progress Note  Patient Name: [name] Date of Encounter: 11/24/2019  [facility] Cardiologist: Larae Grooms, MD   Subjective   Postop day 1 anterior STEMI treated with PCI drug-eluting stenting to the mid LAD and to the first diagonal branch by Dr. Burt Knack yesterday.  He feels clinically improved today.  He denies chest pain or shortness of breath.  Inpatient Medications    Scheduled Meds: . aspirin EC  81 mg Oral Daily  . Chlorhexidine Gluconate Cloth  6 each Topical Daily  . clopidogrel  75 mg Oral Q breakfast  . cyclobenzaprine  5 mg Oral QHS  . enoxaparin (LOVENOX) injection  40 mg Subcutaneous Q24H  . ferrous sulfate  325 mg Oral BID  . gabapentin  100 mg Oral Daily  . loratadine  10 mg Oral Daily  . pantoprazole  40 mg Oral QHS  . potassium chloride  40 mEq Oral Daily  . predniSONE  3 mg Oral Q breakfast  . sodium chloride flush  3 mL Intravenous Q12H  . sodium chloride flush  3 mL Intravenous Q12H  . sulfaSALAzine  1,000 mg Oral BID  . theophylline  300 mg Oral BID   Continuous Infusions: . sodium chloride 10 mL/hr (11/23/19 1806)  . sodium chloride    . sodium chloride     PRN Meds: sodium chloride, sodium chloride, acetaminophen, albuterol, diazepam, HYDROcodone-acetaminophen, morphine injection, nitroGLYCERIN, ondansetron (ZOFRAN) IV, oxyCODONE, sodium chloride flush, sodium chloride flush, zolpidem   Vital Signs    Vitals:   11/24/19 0300 11/24/19 0400 11/24/19 0500 11/24/19 0600  BP: 124/82 126/84 137/89 128/86  Pulse: 82 85 94 (!) 101  Resp: 17 16 13 19   Temp:      TempSrc:      SpO2: 94% 94% 91% 99%  Weight:   81.1 kg   Height:        Intake/Output Summary (Last 24 hours) at 11/24/2019 0855 Last data filed at 11/24/2019 0449 Gross per 24 hour  Intake --  Output 975 ml  Net -975 ml   Last 3 Weights 11/24/2019 11/23/2019 08/03/2019  Weight (lbs) 178 lb 12.7 oz 179 lb 14.3 oz 180 lb  Weight (kg) 81.1 kg 81.6 kg 81.647 kg       Telemetry    Sinus rhythm- Personally Reviewed  ECG    Normal sinus rhythm at 92 with early R wave transition.- Personally Reviewed  Physical Exam   GEN: No acute distress.   Neck: No JVD Cardiac: RRR, no murmurs, rubs, or gallops.  Respiratory: Clear to auscultation bilaterally. GI: Soft, nontender, non-distended  MS: No edema; No deformity. Neuro:  Nonfocal  Psych: Normal affect  Extremities: Right radial puncture site is stable  Labs    High Sensitivity Troponin:   Recent Labs  Lab 11/23/19 1548 11/23/19 1847 11/23/19 2034  TROPONINIHS 60* 570* 1,468*      Chemistry Recent Labs  Lab 11/23/19 1548 11/24/19 0217  NA 137 137  K 3.8 3.5  CL 103 105  CO2 22 23  GLUCOSE 285* 157*  BUN 13 8  CREATININE 0.97 0.59*  CALCIUM 9.4 9.1  PROT 6.7 6.7  ALBUMIN 3.5 3.5  AST 21 38  ALT 19 20  ALKPHOS 74 71  BILITOT 0.8 0.5  GFRNONAA >60 >60  GFRAA >60 >60  ANIONGAP 12 9     Hematology Recent Labs  Lab 11/23/19 1548 11/23/19 1847 11/24/19 0217  WBC 9.9 11.3* 9.4  RBC 4.40 4.45 4.36  HGB  13.6 13.7 13.3  HCT 40.8 41.5 40.1  MCV 92.7 93.3 92.0  MCH 30.9 30.8 30.5  MCHC 33.3 33.0 33.2  RDW 12.1 12.3 12.2  PLT 288 274 245    BNPNo results for input(s): BNP, PROBNP in the last 168 hours.   DDimer No results for input(s): DDIMER in the last 168 hours.   Radiology    CARDIAC CATHETERIZATION  Result Date: 11/23/2019  2nd Sept lesion is 90% stenosed.  1.  Critical stenosis of the mid LAD and patient with anterior STEMI, treated successfully with PCI of the mid LAD using a drug-eluting stent (2.5 x 18 mm resolute Onyx) overlapped with the old bare-metal stent which remained patent. 2.  Severe first diagonal stenosis treated successfully with a 3.0 x 15 mm resolute Onyx DES 3.  Diffuse distal vessel coronary artery disease involving the LAD and PDA 4.  Mild nonobstructive left mainstem and left circumflex stenosis Recommendations: 2D echo for LVEF assessment,  aggressive medical therapy for secondary risk reduction, initially favor medical therapy for severe PDA stenosis because the vessel is diffusely diseased with multiple lesions throughout.  If refractory angina would be reasonable to treat the proximal PDA lesion with stenting.  Will review with interventional colleagues. Pending echo assessment the patient could be considered for early discharge within 24 hours if no recurrent angina and LVEF preserved.   DG Chest Port 1 View  Result Date: 11/23/2019 CLINICAL DATA:  Chest pain EXAM: PORTABLE CHEST 1 VIEW COMPARISON:  12/04/2006 FINDINGS: Apical lordotic positioning. Midline trachea. Normal heart size. Atherosclerosis in the transverse aorta. No pleural effusion or pneumothorax. Hyperinflation. Clear lungs. Remote bilateral rib fractures. Mild right hemidiaphragm elevation. IMPRESSION: Hyperinflation, without acute disease. Electronically Signed   By: Abigail Miyamoto M.D.   On: 11/23/2019 19:44    Cardiac Studies   Cardiac catheterization/PCI and stent (11/23/2019)  Conclusion    2nd Sept lesion is 90% stenosed.   1.  Critical stenosis of the mid LAD and patient with anterior STEMI, treated successfully with PCI of the mid LAD using a drug-eluting stent (2.5 x 18 mm resolute Onyx) overlapped with the old bare-metal stent which remained patent. 2.  Severe first diagonal stenosis treated successfully with a 3.0 x 15 mm resolute Onyx DES 3.  Diffuse distal vessel coronary artery disease involving the LAD and PDA 4.  Mild nonobstructive left mainstem and left circumflex stenosis  Recommendations: 2D echo for LVEF assessment, aggressive medical therapy for secondary risk reduction, initially favor medical therapy for severe PDA stenosis because the vessel is diffusely diseased with multiple lesions throughout.  If refractory angina would be reasonable to treat the proximal PDA lesion with stenting.  Will review with interventional colleagues.    Pending echo assessment the patient could be considered for early discharge within 24 hours if no recurrent angina and LVEF preserved.  Coronary Diagrams  Diagnostic Dominance: Right  Intervention   Implants     Patient Profile     MASUD HOLUB is a 59 y.o. male  patient of Dr. Hassell Done with a history of BMS LAD 2008, HLD on Repatha, GERD, ankylosing spondylitis, fibromyalgia, elevated PSA and asthma.  He was working out yesterday in the treadmill.  After that he developed chest pain, took sublingual nitroglycerin, and called EMS.  He was transported urgently to [facility] where he was found to have an anterior STEMI and was taken to the Cath Lab by Dr. Burt Knack urgently.  Assessment & Plan  1: Coronary artery disease-postop day 1 anterior STEMI treated with PCI drug-eluting stenting of the mid LAD after the previously placed bare-metal stent which was patent and of the first obtuse marginal branch.  He did have distal apical LAD disease and diffuse PDA disease.  Dr. Burt Knack elected to treat these medically.  2D echo is pending.  His troponins peaked at 1446.  He is on aspirin and Plavix.  He denies recurrent recurrent symptoms.  His heart rate is in the low 100 range and he is not on a beta-blocker.  We will start him on Toprol-XL 25 mg a day.  2: Hyperlipidemia-on Repatha although he is only taken 1 dose as an outpatient.  His LDL is 99.  We will continue to monitor this as an outpatient.  3: Type 2 diabetes-hemoglobin A1c 7.1 not currently on any medications.  Patient will follow up with his PCP to further address  Patient is stable for discharge home this afternoon after ambulation if he has no recurrent symptoms on DAPT uninterrupted for 12 months.  We will arrange for TOC 7 then follow-up with Dr. Irish Lack.   For questions or updates, please contact [place] Please consult [email or web address] for contact info under        Signed, Quay Burow, MD   11/24/2019, 8:55 AM

## 2019-11-24 NOTE — Plan of Care (Signed)
Patient ready for discharge.  All instructions given and questions answered.  Cardiac Rehab nurse spoke with patient and did all education.

## 2019-11-24 NOTE — Discharge Instructions (Signed)
Medication Changes: - START Plavix 75mg  daily in addition to Aspirin 81mg  daily. These medications are very important and help keep the stents in your heart open. - START Toprol-XL 25mg  daily.  - STOP Omeprazole and START Protonix 40mg  daily.  - STOP Pseudoephedrine HCl (Sudafed). OK to take over-the-counter cold medications that do not contain a decongestant (pseudophedrine or phenylephrine).  ** Please follow-up PCP for further management of diabetes. **  Post STEMI: NO HEAVY LIFTING X 4 WEEKS. NO SEXUAL ACTIVITY X 4 WEEKS. NO DRIVING X 2 WEEKS. NO SOAKING BATHS, HOT TUBS, POOLS, ETC., X 7 DAYS.  Radial Site Care: Refer to this sheet in the next few weeks. These instructions provide you with information on caring for yourself after your procedure. Your caregiver may also give you more specific instructions. Your treatment has been planned according to current medical practices, but problems sometimes occur. Call your caregiver if you have any problems or questions after your procedure. HOME CARE INSTRUCTIONS  You may shower the day after the procedure.Remove the bandage (dressing) and gently wash the site with plain soap and water.Gently pat the site dry.   Do not apply powder or lotion to the site.   Do not submerge the affected site in water for 3 to 5 days.   Inspect the site at least twice daily.   Do not flex or bend the affected arm for 24 hours.   No lifting over 5 pounds (2.3 kg) for 5 days after your procedure.   Do not drive home if you are discharged the same day of the procedure. Have someone else drive you.  What to expect:  Any bruising will usually fade within 1 to 2 weeks.   Blood that collects in the tissue (hematoma) may be painful to the touch. It should usually decrease in size and tenderness within 1 to 2 weeks.  SEEK IMMEDIATE MEDICAL CARE IF:  You have unusual pain at the radial site.   You have redness, warmth, swelling, or pain at the radial site.     You have drainage (other than a small amount of blood on the dressing).   You have chills.   You have a fever or persistent symptoms for more than 72 hours.   You have a fever and your symptoms suddenly get worse.   Your arm becomes pale, cool, tingly, or numb.   You have heavy bleeding from the site. Hold pressure on the site.    Information about your medication: Plavix (anti-platelet agent)  Generic Name (Brand): clopidogrel (Plavix), once daily medication  PURPOSE: You are taking this medication along with aspirin to lower your chance of having a heart attack, stroke, or blood clots in your heart stent. These can be fatal. Plavix and aspirin help prevent platelets from sticking together and forming a clot that can block an artery or your stent.   Common SIDE EFFECTS you may experience include: bruising or bleeding more easily, shortness of breath  Do not stop taking PLAVIX without talking to the doctor who prescribes it for you. People who are treated with a stent and stop taking Plavix too soon, have a higher risk of getting a blood clot in the stent, having a heart attack, or dying. If you stop Plavix because of bleeding, or for other reasons, your risk of a heart attack or stroke may increase.   Avoid taking NSAID agents or anti-inflammatory medications such as ibuprofen, naproxen given increased bleed risk with plavix - can use acetaminophen (  Tylenol) if needed for pain.  Avoid taking over the counter stomach medications omeprazole (Prilosec) or esomeprazole (Nexium) since these do interact and make plavix less effective - ask your pharmacist or doctor for alterative agents if needed for heartburn or GERD.   Tell all of your doctors and dentists that you are taking Plavix. They should talk to the doctor who prescribed Plavix for you before you have any surgery or invasive procedure.   Contact your health care provider if you experience: severe or uncontrollable bleeding,  pink/red/brown urine, vomiting blood or vomit that looks like "coffee grounds", red or black stools (looks like tar), coughing up blood or blood clots ----------------------------------------------------------------------------------------------------------------------

## 2019-11-24 NOTE — Progress Notes (Signed)
Patient discharged home with spouse as per orders.  Discharge instructions discussed with patient.

## 2019-11-24 NOTE — Progress Notes (Signed)
CARDIAC REHAB PHASE I   PRE:  Rate/Rhythm: 100 ST    BP: sitting 125/80    SaO2:   MODE:  Ambulation: 370 ft   POST:  Rate/Rhythm: 108 ST    BP: sitting 126/90     SaO2:   Pt up ambulating with RN. Finished walk with him, tolerated well, no c/o. Discussed MI, stent, restrictions, diet, exercise, Plavix, NTG and CRPII. Pt very receptive. He has been cutting out sugar but A1C is still elevated. Encouraged watching carbs and discussing with PCP. Will refer to [place]. He understands importance of Plavix. [id number]   [place], ACSM 11/24/2019 10:01 AM

## 2019-12-02 ENCOUNTER — Ambulatory Visit: Payer: PRIVATE HEALTH INSURANCE | Admitting: Physician Assistant

## 2019-12-02 ENCOUNTER — Other Ambulatory Visit: Payer: Self-pay

## 2019-12-02 ENCOUNTER — Encounter: Payer: Self-pay | Admitting: Physician Assistant

## 2019-12-02 VITALS — BP 118/80 | HR 94 | Ht 69.0 in | Wt 172.0 lb

## 2019-12-02 DIAGNOSIS — E118 Type 2 diabetes mellitus with unspecified complications: Secondary | ICD-10-CM | POA: Diagnosis not present

## 2019-12-02 DIAGNOSIS — E782 Mixed hyperlipidemia: Secondary | ICD-10-CM | POA: Diagnosis not present

## 2019-12-02 DIAGNOSIS — I2102 ST elevation (STEMI) myocardial infarction involving left anterior descending coronary artery: Secondary | ICD-10-CM | POA: Diagnosis not present

## 2019-12-02 DIAGNOSIS — R002 Palpitations: Secondary | ICD-10-CM | POA: Diagnosis not present

## 2019-12-02 MED ORDER — METOPROLOL SUCCINATE ER 50 MG PO TB24
50.0000 mg | ORAL_TABLET | Freq: Every day | ORAL | 3 refills | Status: DC
Start: 2019-12-02 — End: 2020-08-23

## 2019-12-02 NOTE — Progress Notes (Signed)
Cardiology Office Note:    Date:  12/02/2019   ID:  [name], DOB 09-01-1960, MRN [id number]  PCP:  Maury Dus, MD  [facility] Cardiologist:  Larae Grooms, MD   [facility] Electrophysiologist:  None   Referring MD: Maury Dus, MD   Chief Complaint:  Hospitalization Follow-up (s/p STEMI >> PCI)    Patient Profile:    [name] is a 59 y.o. male with:   Coronary artery disease   S/p BMS to LAD in 2008  S/p ant-lat STEMI 9/21 >> DES to LAD and DES to D1  Residual diffuse PDA dz and distal LAD dz to be tx med  Hyperlipidemia   Fatty liver disease  Elevated LFTs - statin DCd  Pt self DCd [id number]  Diabetes mellitus   Palpitations  Monitor in 2019: PACs, PVCs, short runs of SVT (<5 beats)  Ankylosing spondylitis  Ulcerative colitis   Asthma   Prior CV studies: Echocardiogram 11/24/19 EF 55-60, Gr 1 DD, apical septal and inf AK, normal RVSF, trivial AI  Cardiac catheterization 11/23/19 LM ost 30 LAD prox stent patent with 25 ISR, mid 95, 50, dist 75; D1 95; 2nd sept 90 LCx mild dz RCA dist 30; RPDA 95, 70, 90 PCI: 2.5 x 18 mm resolute Onyx DES to mLAD (overlapping previous BMS) PCI:  3.0 x 15 mm resolute Onyx DES to D1    History of Present Illness:    [name] was last seen by Dr. Irish Lack in 5/21.  He was recently admitted 9/6-9/7 with an anterolateral STEMI.  ST elevation resolved when patient got to the ED but with ongoing pain, he was taken directly to the cath lab.  Cardiac catheterization demonstrated critical stenosis in the mid LAD and severe stenosis in the D1. Both lesions were tx with a DES.  The prior stent in the LAD remained patent.  There was diffuse distal vessel disease in the LAD and PDA and non-obstructive disease in the LM and LCx.  Med Rx was recommended for the PDA due to diffuse disease with multiple lesions.  PCI of the pPDA could be considered for refractory angina.  Echocardiogram demonstrated EF 55-60.         He returns for follow-up.  He is here alone.  Since discharge, has not had any further anginal symptoms.  He has had some chest soreness.  He has not had significant shortness of breath, orthopnea, leg swelling or syncope.  Of note, he did inadvertently skip taking aspirin.  His wife is picking some up at the store and will resume it today.  Past Medical History:  Diagnosis Date  . Anemia   . Ankylosing spondylitis ([place])   . Arthritis   . Asthma    SINCE AGE 32  . Colitis, ulcerative ([place])   . Coronary artery disease    Bare metal LAD stent 10/08  . Coronary atherosclerosis of native coronary artery 12/25/2012  . ED (erectile dysfunction)   . Elevated prostate specific antigen (PSA) 12/25/2012  . Esophageal reflux 12/25/2012  . Extrinsic asthma, unspecified 12/25/2012  . Fibromyalgia   . GERD (gastroesophageal reflux disease)   . Hypercalcemia 12/25/2012  . Hyperlipidemia   . Left sided ulcerative (chronic) colitis ([place]) 12/25/2012  . Mixed hyperlipidemia 12/25/2012  . Pneumonia    as a child  . Pure hypercholesterolemia 12/25/2012  . TMJ (dislocation of temporomandibular joint)     Current Medications: Current Meds  Medication Sig  . albuterol (PROVENTIL HFA;VENTOLIN HFA)  108 (90 BASE) MCG/ACT inhaler Inhale 2 puffs into the lungs every 6 (six) hours as needed for wheezing.  Marland Kitchen aspirin 81 MG tablet Take 81 mg by mouth daily.   . clopidogrel (PLAVIX) 75 MG tablet Take 1 tablet (75 mg total) by mouth daily with breakfast.  . cyclobenzaprine (FLEXERIL) 5 MG tablet Take 5 mg by mouth at bedtime.   . Evolocumab (REPATHA SURECLICK) 250 MG/ML SOAJ Inject 1 pen into the skin every 14 (fourteen) days.  . ferrous sulfate 325 (65 FE) MG EC tablet Take 325 mg by mouth daily with breakfast.   . gabapentin (NEURONTIN) 100 MG capsule Take 200 mg by mouth daily.   Marland Kitchen HYDROcodone-acetaminophen (NORCO) 10-325 MG per tablet Take 0.5-1 tablets by mouth every 6 (six) hours as needed for severe pain.     Marland Kitchen icosapent Ethyl (VASCEPA) 1 g capsule Take 2 capsules (2 g total) by mouth 2 (two) times daily.  Marland Kitchen loratadine (CLARITIN) 10 MG tablet Take 10 mg by mouth daily as needed for allergies.   . metoprolol succinate (TOPROL-XL) 50 MG 24 hr tablet Take 1 tablet (50 mg total) by mouth daily.  . Multiple Vitamins-Minerals (CENTRUM CARDIO) TABS Take 1 tablet by mouth 2 (two) times daily.   . nitroGLYCERIN (NITROSTAT) 0.4 MG SL tablet Place 1 tablet (0.4 mg total) under the tongue every 5 (five) minutes as needed for chest pain.  . pantoprazole (PROTONIX) 40 MG tablet Take 1 tablet (40 mg total) by mouth at bedtime.  . predniSONE (DELTASONE) 1 MG tablet Take 3 mg by mouth daily.  Marland Kitchen sulfaSALAzine (AZULFIDINE) 500 MG EC tablet Take 1,000 mg by mouth 2 (two) times daily.   . theophylline (THEODUR) 300 MG 12 hr tablet Take 300 mg by mouth 2 (two) times daily.  . [DISCONTINUED] metoprolol succinate (TOPROL-XL) 25 MG 24 hr tablet Take 1 tablet (25 mg total) by mouth daily.     Allergies:   Patient has no known allergies.   Social History   Tobacco Use  . Smoking status: Never Smoker  . Smokeless tobacco: Former Network engineer  . Vaping Use: Never used  Substance Use Topics  . Alcohol use: Yes  . Drug use: Not on file     Family Hx: The patient's family history includes COPD in his maternal grandfather; Emphysema in his maternal grandfather; GER disease in his sister; Hypertension in his mother; Lung cancer in his father.  Review of Systems  Cardiovascular: Positive for palpitations (occ rapid heart beats; similar to prior hx).  Gastrointestinal: Negative for hematochezia and melena.     EKGs/Labs/Other Test Reviewed:    EKG:  EKG is  ordered today.  The ekg ordered today demonstrates normal sinus rhythm, heart rate 94, normal axis, first-degree AV block, PR interval 210 ms, T wave inversions V1-V3, QTC 442  Recent Labs: 11/23/2019: TSH 1.411 11/24/2019: ALT 20; BUN 8; Creatinine, Ser 0.59;  Hemoglobin 13.3; Platelets 245; Potassium 3.5; Sodium 137   Recent Lipid Panel Lab Results  Component Value Date/Time   CHOL 159 11/24/2019 02:17 AM   CHOL 213 (H) 10/26/2019 10:25 AM   TRIG 130 11/24/2019 02:17 AM   HDL 36 (L) 11/24/2019 02:17 AM   HDL 33 (L) 10/26/2019 10:25 AM   CHOLHDL 4.4 11/24/2019 02:17 AM   LDLCALC 97 11/24/2019 02:17 AM   LDLCALC 154 (H) 10/26/2019 10:25 AM    Physical Exam:    VS:  BP 118/80   Pulse 94   Ht  5\' 9"  (1.753 m)   Wt 172 lb (78 kg)   SpO2 95%   BMI 25.40 kg/m     Wt Readings from Last 3 Encounters:  12/02/19 172 lb (78 kg)  11/24/19 178 lb 12.7 oz (81.1 kg)  08/03/19 180 lb (81.6 kg)     Constitutional:      Appearance: Healthy appearance. Not in distress.  Neck:     Vascular: JVD normal.  Pulmonary:     Effort: Pulmonary effort is normal.     Breath sounds: No wheezing. No rales.  Cardiovascular:     Normal rate. Regular rhythm. Normal S1. Normal S2.     Murmurs: There is no murmur.     Comments: R wrist without hematoma Edema:    Peripheral edema absent.  Abdominal:     Palpations: Abdomen is soft.  Skin:    General: Skin is warm and dry.  Neurological:     General: No focal deficit present.     Mental Status: Alert and oriented to person, place and time.     Cranial Nerves: Cranial nerves are intact.       ASSESSMENT & PLAN:    1. ST elevation myocardial infarction involving left anterior descending (LAD) coronary artery ([place]) History of prior stenting to the LAD in 2008.  He was admitted recently with anterolateral STEMI treated with a DES to the LAD and DES to the D1.  He has some distal LAD disease which is managed medically.  He also has diffuse disease with multiple lesions in the PDA.  As noted, plan is to treat this medically unless he has refractory angina.  He has had some fast heartbeats.  He has not had recurrent angina since discharge from the hospital.  I will increase his metoprolol for control of  palpitations as well as to manage anginal symptoms.  We discussed the importance of earlier follow-up if he is having refractory symptoms.  I reviewed his cath report with him today.  I have encouraged him to start cardiac rehabilitation.  I have encouraged him to resume aspirin today.    -Continue aspirin, clopidogrel, evolocumab, metoprolol succinate.    -Increase metoprolol succinate to 50 mg daily.    -Follow-up with Dr. Irish Lack in 2 months.  2. Mixed hyperlipidemia LDL 97 in the hospital.  But, he just restarted Evolocumab.  Obtain fasting Lipids, CMET in 2 mos.   3. Type 2 diabetes mellitus with complication (HCC) Recent hemoglobin A1c 7.1.  Continue follow-up with primary care.  Consider empagliflozin given CV benefit.  4. Palpitations He has a history of very brief episodes of SVT on prior monitoring.  He has noticed some fast heartbeats recently.  Increase metoprolol succinate to 50 mg daily as outlined above.    Dispo:  Return in about 2 months (around 02/01/2020) for Routine Follow Up with Dr. Irish Lack, in person.   Medication Adjustments/Labs and Tests Ordered: Current medicines are reviewed at length with the patient today.  Concerns regarding medicines are outlined above.  Tests Ordered: No orders of the defined types were placed in this encounter.  Medication Changes: Meds ordered this encounter  Medications  . metoprolol succinate (TOPROL-XL) 50 MG 24 hr tablet    Sig: Take 1 tablet (50 mg total) by mouth daily.    Dispense:  90 tablet    Refill:  3    Signed, Richardson Dopp, PA-C  12/02/2019 12:30 PM    [facility] [place],  [place], [place]  [address] Phone: [phone]; Fax: [phone]

## 2019-12-02 NOTE — Patient Instructions (Addendum)
Medication Instructions:  Your physician has recommended you make the following change in your medication:   1) Start Aspirin 81 mg, 1 tablet by mouth once a day 2) Increase Metoprolol to 50 mg, 1 tablet by mouth once a day  *If you need a refill on your cardiac medications before your next appointment, please call your pharmacy*  Lab Work: None ordered today  If you have labs (blood work) drawn today and your tests are completely normal, you will receive your results only by: Marland Kitchen MyChart Message (if you have MyChart) OR . A paper copy in the mail If you have any lab test that is abnormal or we need to change your treatment, we will call you to review the results.  Testing/Procedures: None ordered today  Follow-Up: On 02/04/20 at 10:20AM with Casandra Doffing, MD **Come fasting to this appointment, your cholesterol will be checked**

## 2019-12-04 ENCOUNTER — Telehealth (HOSPITAL_COMMUNITY): Payer: Self-pay

## 2019-12-04 NOTE — Telephone Encounter (Signed)
Called and spoke with pt in regards to CR, pt stated he is not interested at this time.   Closed referral 

## 2019-12-04 NOTE — Telephone Encounter (Signed)
Pt insurance is active and benefits verified through [place]. Co-pay $50.00, DED $500.00/$0.00 met, out of pocket $2,500.00/$483.73 met, co-insurance 10%. Yes pre-authorization required. Barbie/Sana, 12/04/19 @ 938AM, [facility]#[id number]  Will contact patient to see if he is interested in the Cardiac Rehab Program. If interested, patient will need to complete follow up appt. Once completed, patient will be contacted for scheduling upon review by the RN Navigator.

## 2019-12-04 NOTE — Addendum Note (Signed)
Addended by: Jeremy Johann on: 12/04/2019 07:57 AM   Modules accepted: Orders

## 2019-12-28 ENCOUNTER — Other Ambulatory Visit: Payer: PRIVATE HEALTH INSURANCE

## 2020-01-07 ENCOUNTER — Telehealth: Payer: Self-pay | Admitting: Interventional Cardiology

## 2020-01-07 NOTE — Telephone Encounter (Signed)
Pt c/o medication issue:  1. Name of Medication: Evolocumab (REPATHA SURECLICK) 251 MG/ML SOAJ  2. How are you currently taking this medication (dosage and times per day)? Injection every 14 days  3. Are you having a reaction (difficulty breathing--STAT)? No   4. What is your medication issue? [name] is calling stating he tried to get a refill and they advised him our office will need to contact his insurance company for prior authorization. Please advise.

## 2020-01-08 NOTE — Telephone Encounter (Signed)
I will mail the forms to pt but the dr form will need to be filled by provider just incase they are there already you might want to print the last page and get that signed while we get the pt to do there portion. I know sometimes doctors may not be in the office all the time

## 2020-01-08 NOTE — Telephone Encounter (Signed)
Reached out to their insurance company to find out that this is actually now excluded from the plan and that they will need to fill out [employer] snf paperwork pt participated in a 3 way call with me, him and the insurance representative where he voiced understanding. Will route to chst pharmd's to make them aware that the form needs to be filled out since the provider for signing is located at their office.

## 2020-01-08 NOTE — Telephone Encounter (Signed)
What needs to be sent to SNF? Just the signed Rx? Did the patient fill out the other portion? Do they cover praluent?

## 2020-01-11 NOTE — Telephone Encounter (Signed)
Signed Rx for SNF faxed over

## 2020-01-18 ENCOUNTER — Telehealth: Payer: Self-pay

## 2020-01-18 NOTE — Telephone Encounter (Signed)
Left message for pt to see if he has filled out SNF forms. Application page with prescription has already been faxed over. Reached out to insurance and Praluent is excluded from plan benefits as well, so his insurance plan is completely excluding [id number] coverage for 2022 year unfortunately.

## 2020-01-18 NOTE — Telephone Encounter (Signed)
-----   Message from Rockne Menghini, [place] sent at 01/15/2020  4:15 PM EDT ----- Patient wanted to know status of Repatha.  Called Friday at 4:15  Please call him on Monday [phone]  kristin

## 2020-01-18 NOTE — Telephone Encounter (Signed)
Called and spoke w/pt regarding the repatha and he said that I should be receiving the pt assistance forms soon so therefor that is the status of the repatha because until I send it to the manufacturer there is no update on determination

## 2020-01-18 NOTE — Telephone Encounter (Signed)
Per [place] Silos, pt sent in rest of SNF application and faxed it this AM. Pt is due for next dose this Thursday. Will call [employer] on Wednesday to see if they have made a determination.

## 2020-01-20 NOTE — Telephone Encounter (Signed)
[employer], they stated they never received pages 1-3 of pt's application. [name] has re-faxed these. Called pt since he's due for an injection this week. He will come by clinic tomorrow 11/4 to pick up a Repatha sample while his [employer] application is pending.

## 2020-01-25 NOTE — Telephone Encounter (Signed)
Please let pt know that [employer] SNF needs his proof of income. He can drop it off and we can fax. Or he can fax to [phone]

## 2020-01-25 NOTE — Telephone Encounter (Signed)
Called and lmomed the pt that he needs to send in income information to them or bring by chst so that they may fax over to snf on his behalf will await callback from pt

## 2020-01-27 NOTE — Telephone Encounter (Signed)
Received fax that SNF needs proof of income for patient.

## 2020-01-27 NOTE — Telephone Encounter (Signed)
Called the pt to let them know that they needed proof of income and he stated that it was taken care of and they have scheduled a delivery already

## 2020-02-02 NOTE — Telephone Encounter (Signed)
Patient does not qualify for SNF due to income being too high. I have called pt and discussed with him his options. I do not believe a copay card will work, as typically this needs to be billed as secondary, however we will try. His other option is the Orion 4 trial. He does not qualify for Vesalius due to having an MI. He will have to wait 3 months after his last Repatha injection. Not sure when that was.  We hope Inclisirn will be approved Jan1, if pt insurance covers- then would switch pt to actually drug (incase he gets placebo)  Pt has apt with Dr. Irish Lack on Thursday- would like to discuss with both of [place] then.  Tried to activate copay card for pt but they needed more info. Left message on pt machine to call [phone] to get copay card. We will then call pharmacy and ask pharmacist to try to run rx

## 2020-02-02 NOTE — Addendum Note (Signed)
Addended by: Marcelle Overlie D on: 02/02/2020 01:36 PM   Modules accepted: Orders

## 2020-02-03 NOTE — Progress Notes (Signed)
Cardiology Office Note   Date:  02/04/2020   ID:  [name], DOB 11/05/1960, MRN [id number]  PCP:  Harold Dus, MD    No chief complaint on file.  CAD  Wt Readings from Last 3 Encounters:  02/04/20 174 lb 9.6 oz (79.2 kg)  12/02/19 172 lb (78 kg)  11/24/19 178 lb 12.7 oz (81.1 kg)       History of Present Illness: [name] is a 59 y.o. male  who had an LAD stent in 2008.His angina was an exertional chest tightness. It occurred with mowing the lawn. It would stop with rest. Sx resolved after the stent. Of note, he was seen at [facility] ER and had a negative w/u just before his cardiac w/u with me.   He has had some issues with fatty liver. His atorvastatin was stopped due to increased LFTs.He was started on Crestor in 4/19.  He has had some palpitations earlier in 2019. Holter monitor at that time showed:  Normal sinus rhythm with occasional PACs and PVCs.  Short runs of SVT, less than 5 beats typically.  No sustained arrhtyhmias.  Continue medical therapy. If symptoms worsen, could increase beta blocker.  Crestor was stopped in July 2019 due to increased LFTs. He has f/u with [facility] in September 2019.   Had anterior MI in 9/21 with cath showing: " Critical stenosis of the mid LAD and patient with anterior STEMI, treated successfully with PCI of the mid LAD using a drug-eluting stent (2.5 x 18 mm resolute Onyx) overlapped with the old bare-metal stent which remained patent. 2.  Severe first diagonal stenosis treated successfully with a 3.0 x 15 mm resolute Onyx DES 3.  Diffuse distal vessel coronary artery disease involving the LAD and PDA 4.  Mild nonobstructive left mainstem and left circumflex stenosis"  Since then, he has felt well.  He had a few twinges in his chest but nothing like his MI pain.  Prior to MI, he "felt off" and had some dizzy spells.  Denies : Chest pain. Dizziness. Leg edema. Nitroglycerin use. Orthopnea.  Palpitations. Paroxysmal nocturnal dyspnea. Shortness of breath. Syncope.   Sister passed away in 01-24-2023 from lung cancer; she was a nonsmoker.   Past Medical History:  Diagnosis Date  . Anemia   . Ankylosing spondylitis ([place])   . Arthritis   . Asthma    SINCE AGE 33  . Colitis, ulcerative ([place])   . Coronary artery disease    Bare metal LAD stent 10/08  . Coronary atherosclerosis of native coronary artery 12/25/2012  . ED (erectile dysfunction)   . Elevated prostate specific antigen (PSA) 12/25/2012  . Esophageal reflux 12/25/2012  . Extrinsic asthma, unspecified 12/25/2012  . Fibromyalgia   . GERD (gastroesophageal reflux disease)   . Hypercalcemia 12/25/2012  . Hyperlipidemia   . Left sided ulcerative (chronic) colitis ([place]) 12/25/2012  . Mixed hyperlipidemia 12/25/2012  . Pneumonia    as a child  . Pure hypercholesterolemia 12/25/2012  . TMJ (dislocation of temporomandibular joint)     Past Surgical History:  Procedure Laterality Date  . COLONOSCOPY    . CORONARY/GRAFT ACUTE MI REVASCULARIZATION N/A 11/23/2019   Procedure: Coronary/Graft Acute MI Revascularization;  Surgeon: Sherren Mocha, MD;  Location: [facility];  Service: Cardiovascular;  Laterality: N/A;  . EYE SURGERY Bilateral   . heart stent    . LEFT HEART CATH AND CORONARY ANGIOGRAPHY N/A 11/23/2019   Procedure: LEFT HEART CATH AND CORONARY  ANGIOGRAPHY;  Surgeon: Sherren Mocha, MD;  Location: [facility];  Service: Cardiovascular;  Laterality: N/A;  . skin cancer removed from left eyelid     . skull fracture after falling down stairs    . TONSILLECTOMY       Current Outpatient Medications  Medication Sig Dispense Refill  . albuterol (PROVENTIL HFA;VENTOLIN HFA) 108 (90 BASE) MCG/ACT inhaler Inhale 2 puffs into the lungs every 6 (six) hours as needed for wheezing.    Marland Kitchen aspirin 81 MG tablet Take 81 mg by mouth daily.     . clopidogrel (PLAVIX) 75 MG tablet Take 1 tablet (75 mg total) by mouth daily  with breakfast. 30 tablet 11  . cyclobenzaprine (FLEXERIL) 5 MG tablet Take 5 mg by mouth at bedtime.     . ferrous sulfate 325 (65 FE) MG EC tablet Take 325 mg by mouth daily with breakfast.     . gabapentin (NEURONTIN) 100 MG capsule Take 200 mg by mouth daily.     Marland Kitchen HYDROcodone-acetaminophen (NORCO) 10-325 MG per tablet Take 0.5-1 tablets by mouth every 6 (six) hours as needed for severe pain.   0  . icosapent Ethyl (VASCEPA) 1 g capsule Take 2 capsules (2 g total) by mouth 2 (two) times daily. 120 capsule 8  . loratadine (CLARITIN) 10 MG tablet Take 10 mg by mouth daily as needed for allergies.     . metoprolol succinate (TOPROL-XL) 50 MG 24 hr tablet Take 1 tablet (50 mg total) by mouth daily. 90 tablet 3  . Multiple Vitamins-Minerals (CENTRUM CARDIO) TABS Take 1 tablet by mouth 2 (two) times daily.     . nitroGLYCERIN (NITROSTAT) 0.4 MG SL tablet Place 1 tablet (0.4 mg total) under the tongue every 5 (five) minutes as needed for chest pain. 25 tablet 3  . pantoprazole (PROTONIX) 40 MG tablet Take 1 tablet (40 mg total) by mouth at bedtime. 30 tablet 2  . predniSONE (DELTASONE) 1 MG tablet Take 3 mg by mouth daily.    Marland Kitchen sulfaSALAzine (AZULFIDINE) 500 MG EC tablet Take 1,000 mg by mouth 2 (two) times daily.     . theophylline (THEODUR) 300 MG 12 hr tablet Take 300 mg by mouth 2 (two) times daily.     No current facility-administered medications for this visit.    Allergies:   Patient has no known allergies.    Social History:  The patient  reports that he has never smoked. He has quit using smokeless tobacco. He reports current alcohol use.   Family History:  The patient's family history includes COPD in his maternal grandfather; Emphysema in his maternal grandfather; GER disease in his sister; Hypertension in his mother; Lung cancer in his father.    ROS:  Please see the history of present illness.   Otherwise, review of systems are positive for stress from Mi and sister's death.   All  other systems are reviewed and negative.    PHYSICAL EXAM: VS:  BP 120/62   Pulse 99   Ht 5\' 9"  (1.753 m)   Wt 174 lb 9.6 oz (79.2 kg)   SpO2 93%   BMI 25.78 kg/m  , BMI Body mass index is 25.78 kg/m. GEN: Well nourished, well developed, in no acute distress  HEENT: normal  Neck: no JVD, carotid bruits, or masses Cardiac: RRR; no murmurs, rubs, or gallops,no edema  Respiratory:  clear to auscultation bilaterally, normal work of breathing GI: soft, nontender, nondistended, + BS MS: no deformity or  atrophy  Skin: warm and dry, no rash Neuro:  Strength and sensation are intact Psych: euthymic mood, full affect   EKG:   The ekg ordered post MI in 9/21 demonstrates NSR, no ST changes   Recent Labs: 11/23/2019: TSH 1.411 11/24/2019: ALT 20; BUN 8; Creatinine, Ser 0.59; Hemoglobin 13.3; Platelets 245; Potassium 3.5; Sodium 137   Lipid Panel    Component Value Date/Time   CHOL 159 11/24/2019 0217   CHOL 213 (H) 10/26/2019 1025   TRIG 130 11/24/2019 0217   HDL 36 (L) 11/24/2019 0217   HDL 33 (L) 10/26/2019 1025   CHOLHDL 4.4 11/24/2019 0217   VLDL 26 11/24/2019 0217   LDLCALC 97 11/24/2019 0217   LDLCALC 154 (H) 10/26/2019 1025     Other studies Reviewed: Additional studies/ records that were reviewed today with results demonstrating: labs reviewed; LDL 97 in 9/21.   ASSESSMENT AND PLAN:  1. CAD: No angina. Continue aggressive secondary prevention.  I personally reviewed the cath films and showed them to the patient.  He has some PDA and distal LAD disease. If he has more sx, would plan for repeat cath with PCI. He will let us know if there is a change in his sx.  Or now, he will go back to gradual increase in his exercise.  2. Hyperlipidemia: Continue repatha. Due to insurance reasons, this may need to change.  Being followed by Pharm.D. lipid clinic.  Recheck lipids today.  Hope to see LDL below 70. 3. Elevated LFTs: Normal in 9/21.  We will have to follow closely as his  lipid-lowering therapy changes. 4. Overweight: BMI just barely over 25.  Doing well. Whole food, plant based diet. Increasing exercise.   Current medicines are reviewed at length with the patient today.  The patient concerns regarding his medicines were addressed.  The following changes have been made:  No change  Labs/ tests ordered today include:  No orders of the defined types were placed in this encounter.   Recommend 150 minutes/week of aerobic exercise Low fat, low carb, high fiber diet recommended  Disposition:   FU in 3 months   Signed, Larae Grooms, MD  02/04/2020 10:55 AM    [facility] [address], [place], [place]  [address] Phone: [phone]; Fax: [phone]

## 2020-02-04 ENCOUNTER — Ambulatory Visit (INDEPENDENT_AMBULATORY_CARE_PROVIDER_SITE_OTHER): Payer: PRIVATE HEALTH INSURANCE | Admitting: Interventional Cardiology

## 2020-02-04 ENCOUNTER — Encounter: Payer: Self-pay | Admitting: Interventional Cardiology

## 2020-02-04 ENCOUNTER — Other Ambulatory Visit: Payer: Self-pay

## 2020-02-04 VITALS — BP 120/62 | HR 99 | Ht 69.0 in | Wt 174.6 lb

## 2020-02-04 DIAGNOSIS — E118 Type 2 diabetes mellitus with unspecified complications: Secondary | ICD-10-CM | POA: Diagnosis not present

## 2020-02-04 DIAGNOSIS — I25118 Atherosclerotic heart disease of native coronary artery with other forms of angina pectoris: Secondary | ICD-10-CM

## 2020-02-04 DIAGNOSIS — E782 Mixed hyperlipidemia: Secondary | ICD-10-CM | POA: Diagnosis not present

## 2020-02-04 LAB — HEPATIC FUNCTION PANEL
ALT: 14 IU/L (ref 0–44)
AST: 15 IU/L (ref 0–40)
Albumin: 4.5 g/dL (ref 3.8–4.9)
Alkaline Phosphatase: 99 IU/L (ref 44–121)
Bilirubin Total: 0.5 mg/dL (ref 0.0–1.2)
Bilirubin, Direct: 0.16 mg/dL (ref 0.00–0.40)
Total Protein: 7.6 g/dL (ref 6.0–8.5)

## 2020-02-04 LAB — LIPID PANEL
Chol/HDL Ratio: 2.9 ratio (ref 0.0–5.0)
Cholesterol, Total: 123 mg/dL (ref 100–199)
HDL: 42 mg/dL (ref >39)
LDL Chol Calc (NIH): 61 mg/dL (ref 0–99)
Triglycerides: 112 mg/dL (ref 0–149)
VLDL Cholesterol Cal: 20 mg/dL (ref 5–40)

## 2020-02-04 NOTE — Patient Instructions (Addendum)
Medication Instructions:  Your physician recommends that you continue on your current medications as directed. Please refer to the Current Medication list given to you today.  *If you need a refill on your cardiac medications before your next appointment, please call your pharmacy*   Lab Work: TODAY: LFTS, LIPIDS  If you have labs (blood work) drawn today and your tests are completely normal, you will receive your results only by: Marland Kitchen MyChart Message (if you have MyChart) OR . A paper copy in the mail If you have any lab test that is abnormal or we need to change your treatment, we will call you to review the results.   Testing/Procedures: None  Follow-Up: Follow up with Dr. Irish Lack on 05/06/20 at 10:20 AM   Other Instructions  High-Fiber Diet Fiber, also called dietary fiber, is a type of carbohydrate that is found in fruits, vegetables, whole grains, and beans. A high-fiber diet can have many health benefits. Your health care provider may recommend a high-fiber diet to help:  Prevent constipation. Fiber can make your bowel movements more regular.  Lower your cholesterol.  Relieve the following conditions: ? Swelling of veins in the anus (hemorrhoids). ? Swelling and irritation (inflammation) of specific areas of the digestive tract (uncomplicated diverticulosis). ? A problem of the large intestine (colon) that sometimes causes pain and diarrhea (irritable bowel syndrome, IBS).  Prevent overeating as part of a weight-loss plan.  Prevent heart disease, type 2 diabetes, and certain cancers. What is my plan? The recommended daily fiber intake in grams (g) includes:  38 g for men age 3 or younger.  30 g for men over age 21.  26 g for women age 65 or younger.  21 g for women over age 50. You can get the recommended daily intake of dietary fiber by:  Eating a variety of fruits, vegetables, grains, and beans.  Taking a fiber supplement, if it is not possible to get  enough fiber through your diet. What do I need to know about a high-fiber diet?  It is better to get fiber through food sources rather than from fiber supplements. There is not a lot of research about how effective supplements are.  Always check the fiber content on the nutrition facts label of any prepackaged food. Look for foods that contain 5 g of fiber or more per serving.  Talk with a diet and nutrition specialist (dietitian) if you have questions about specific foods that are recommended or not recommended for your medical condition, especially if those foods are not listed below.  Gradually increase how much fiber you consume. If you increase your intake of dietary fiber too quickly, you may have bloating, cramping, or gas.  Drink plenty of water. Water helps you to digest fiber. What are tips for following this plan?  Eat a wide variety of high-fiber foods.  Make sure that half of the grains that you eat each day are whole grains.  Eat breads and cereals that are made with whole-grain flour instead of refined flour or white flour.  Eat brown rice, bulgur wheat, or millet instead of white rice.  Start the day with a breakfast that is high in fiber, such as a cereal that contains 5 g of fiber or more per serving.  Use beans in place of meat in soups, salads, and pasta dishes.  Eat high-fiber snacks, such as berries, raw vegetables, nuts, and popcorn.  Choose whole fruits and vegetables instead of processed forms like juice or sauce.  What foods can I eat?  Fruits Berries. Pears. Apples. Oranges. Avocado. Prunes and raisins. Dried figs. Vegetables Sweet potatoes. Spinach. Kale. Artichokes. Cabbage. Broccoli. Cauliflower. Green peas. Carrots. Squash. Grains Whole-grain breads. Multigrain cereal. Oats and oatmeal. Brown rice. Barley. Bulgur wheat. Blanket. Quinoa. Bran muffins. Popcorn. Rye wafer crackers. Meats and other proteins Navy, kidney, and pinto beans. Soybeans. Split  peas. Lentils. Nuts and seeds. Dairy Fiber-fortified yogurt. Beverages Fiber-fortified soy milk. Fiber-fortified orange juice. Other foods Fiber bars. The items listed above may not be a complete list of recommended foods and beverages. Contact a dietitian for more options. What foods are not recommended? Fruits Fruit juice. Cooked, strained fruit. Vegetables Fried potatoes. Canned vegetables. Well-cooked vegetables. Grains White bread. Pasta made with refined flour. White rice. Meats and other proteins Fatty cuts of meat. Fried chicken or fried fish. Dairy Milk. Yogurt. Cream cheese. Sour cream. Fats and oils Butters. Beverages Soft drinks. Other foods Cakes and pastries. The items listed above may not be a complete list of foods and beverages to avoid. Contact a dietitian for more information. Summary  Fiber is a type of carbohydrate. It is found in fruits, vegetables, whole grains, and beans.  There are many health benefits of eating a high-fiber diet, such as preventing constipation, lowering blood cholesterol, helping with weight loss, and reducing your risk of heart disease, diabetes, and certain cancers.  Gradually increase your intake of fiber. Increasing too fast can result in cramping, bloating, and gas. Drink plenty of water while you increase your fiber.  The best sources of fiber include whole fruits and vegetables, whole grains, nuts, seeds, and beans. This information is not intended to replace advice given to you by your health care provider. Make sure you discuss any questions you have with your health care provider. Document Revised: 01/07/2017 Document Reviewed: 01/07/2017 Elsevier Patient Education  2020 [employer].

## 2020-02-05 ENCOUNTER — Telehealth: Payer: Self-pay | Admitting: Pharmacist

## 2020-02-05 NOTE — Telephone Encounter (Signed)
[facility] clinic received phone call from [facility] stating that if they had a denial letter from SNF (pt assistance) that they would cover the [place] for next year. I have faxed [facility] [phone] the denial letter from SNF.  I called and left a detailed message on patient machine per [facility]

## 2020-02-18 ENCOUNTER — Other Ambulatory Visit: Payer: Self-pay | Admitting: Student

## 2020-02-18 NOTE — Telephone Encounter (Signed)
Pt's pharmacy is requesting a refill on pantoprazole. Would Dr. Varanasi like to refill this medication? Please address 

## 2020-03-04 ENCOUNTER — Other Ambulatory Visit: Payer: Self-pay | Admitting: Student

## 2020-03-07 NOTE — Telephone Encounter (Signed)
Outpatient Medication Detail   Disp Refills Start End   metoprolol succinate (TOPROL-XL) 50 MG 24 hr tablet 90 tablet 3 12/02/2019    Sig - Route: Take 1 tablet (50 mg total) by mouth daily. - Oral   Sent to pharmacy as: metoprolol succinate (TOPROL-XL) 50 MG 24 hr tablet   E-Prescribing Status: Receipt confirmed by pharmacy (12/02/2019 12:25 PM EDT)     Pharmacy  [id number] - [place], [place]

## 2020-04-08 ENCOUNTER — Other Ambulatory Visit: Payer: Self-pay | Admitting: Student

## 2020-05-04 NOTE — Progress Notes (Signed)
Cardiology Office Note   Date:  05/06/2020   ID:  [name], DOB July 04, 1960, MRN [id number]  PCP:  Maury Dus, MD    No chief complaint on file.  CAD  Wt Readings from Last 3 Encounters:  05/06/20 178 lb 3.2 oz (80.8 kg)  02/04/20 174 lb 9.6 oz (79.2 kg)  12/02/19 172 lb (78 kg)       History of Present Illness: [name] is a 60 y.o. male  who had an LAD stent in 2008.His angina was an exertional chest tightness. It occurred with mowing the lawn. It would stop with rest. Sx resolved after the stent. Of note, he was seen at [facility] ER and had a negative w/u just before his cardiac w/u with me.   He has had some issues with fatty liver. His atorvastatin was stopped due to increased LFTs.He was started on Crestor in 4/19.  He has had some palpitations earlier in 2019. Holter monitor at that time showed:  Normal sinus rhythm with occasional PACs and PVCs.  Short runs of SVT, less than 5 beats typically.  No sustained arrhtyhmias.  Continue medical therapy. If symptoms worsen, could increase beta blocker.  Crestor was stopped in July 2019 due to increased LFTs. He has f/u with [facility] in September2019.  Had anterior MI in 9/21 with cath showing: "Critical stenosis of the mid LAD and patient with anterior STEMI, treated successfully with PCI of the mid LAD using a drug-eluting stent (2.5 x 18 mm resolute Onyx) overlapped with the old bare-metal stent which remained patent. 2. Severe first diagonal stenosis treated successfully with a 3.0 x 15 mm resolute Onyx DES 3. Diffuse distal vessel coronary artery disease involving the LAD and PDA 4. Mild nonobstructive left mainstem and left circumflex stenosis"  Since then, he has felt well.  He had a few twinges in his chest but nothing like his MI pain.  Prior to MI, he "felt off" and had some dizzy spells.  Sister passed away in January 27, 2023 from lung cancer; she was a nonsmoker.  Past Medical  History:  Diagnosis Date  . Anemia   . Ankylosing spondylitis ([place])   . Arthritis   . Asthma    SINCE AGE 55  . Colitis, ulcerative ([place])   . Coronary artery disease    Bare metal LAD stent 10/08  . Coronary atherosclerosis of native coronary artery 12/25/2012  . ED (erectile dysfunction)   . Elevated prostate specific antigen (PSA) 12/25/2012  . Esophageal reflux 12/25/2012  . Extrinsic asthma, unspecified 12/25/2012  . Fibromyalgia   . GERD (gastroesophageal reflux disease)   . Hypercalcemia 12/25/2012  . Hyperlipidemia   . Left sided ulcerative (chronic) colitis ([place]) 12/25/2012  . Mixed hyperlipidemia 12/25/2012  . Pneumonia    as a child  . Pure hypercholesterolemia 12/25/2012  . TMJ (dislocation of temporomandibular joint)     Past Surgical History:  Procedure Laterality Date  . COLONOSCOPY    . CORONARY/GRAFT ACUTE MI REVASCULARIZATION N/A 11/23/2019   Procedure: Coronary/Graft Acute MI Revascularization;  Surgeon: Sherren Mocha, MD;  Location: [facility];  Service: Cardiovascular;  Laterality: N/A;  . EYE SURGERY Bilateral   . heart stent    . LEFT HEART CATH AND CORONARY ANGIOGRAPHY N/A 11/23/2019   Procedure: LEFT HEART CATH AND CORONARY ANGIOGRAPHY;  Surgeon: Sherren Mocha, MD;  Location: [facility];  Service: Cardiovascular;  Laterality: N/A;  . skin cancer removed from left eyelid     .  skull fracture after falling down stairs    . TONSILLECTOMY       Current Outpatient Medications  Medication Sig Dispense Refill  . albuterol (PROVENTIL HFA;VENTOLIN HFA) 108 (90 BASE) MCG/ACT inhaler Inhale 2 puffs into the lungs every 6 (six) hours as needed for wheezing.    Marland Kitchen aspirin 81 MG tablet Take 81 mg by mouth daily.     . clopidogrel (PLAVIX) 75 MG tablet Take 1 tablet (75 mg total) by mouth daily with breakfast. 30 tablet 11  . cyclobenzaprine (FLEXERIL) 5 MG tablet Take 5 mg by mouth at bedtime.     . ferrous sulfate 325 (65 FE) MG EC tablet Take 325 mg  by mouth daily with breakfast.     . gabapentin (NEURONTIN) 100 MG capsule Take 200 mg by mouth daily.     Marland Kitchen HYDROcodone-acetaminophen (NORCO) 10-325 MG per tablet Take 0.5-1 tablets by mouth every 6 (six) hours as needed for severe pain.   0  . icosapent Ethyl (VASCEPA) 1 g capsule Take 2 capsules (2 g total) by mouth 2 (two) times daily. 120 capsule 8  . loratadine (CLARITIN) 10 MG tablet Take 10 mg by mouth daily as needed for allergies.     . metoprolol succinate (TOPROL-XL) 50 MG 24 hr tablet Take 1 tablet (50 mg total) by mouth daily. 90 tablet 3  . Multiple Vitamins-Minerals (CENTRUM CARDIO) TABS Take 1 tablet by mouth 2 (two) times daily.     . nitroGLYCERIN (NITROSTAT) 0.4 MG SL tablet Place 1 tablet (0.4 mg total) under the tongue every 5 (five) minutes as needed for chest pain. 25 tablet 3  . pantoprazole (PROTONIX) 40 MG tablet TAKE 1 TABLET BY MOUTH EVERYDAY AT BEDTIME 90 tablet 3  . predniSONE (DELTASONE) 1 MG tablet Take 3 mg by mouth daily.    Marland Kitchen sulfaSALAzine (AZULFIDINE) 500 MG EC tablet Take 1,000 mg by mouth 2 (two) times daily.     . theophylline (THEODUR) 300 MG 12 hr tablet Take 300 mg by mouth 2 (two) times daily.     No current facility-administered medications for this visit.    Allergies:   Patient has no known allergies.    Social History:  The patient  reports that he has never smoked. He has quit using smokeless tobacco. He reports current alcohol use.   Family History:  The patient's family history includes COPD in his maternal grandfather; Emphysema in his maternal grandfather; GER disease in his sister; Hypertension in his mother; Lung cancer in his father.    ROS:  Please see the history of present illness.   Otherwise, review of systems are positive for high resting HR- thinks he does not drink enough water.   All other systems are reviewed and negative.    PHYSICAL EXAM: VS:  BP 118/78   Pulse 100   Ht 5\' 9"  (1.753 m)   Wt 178 lb 3.2 oz (80.8 kg)    SpO2 94%   BMI 26.32 kg/m  , BMI Body mass index is 26.32 kg/m. GEN: Well nourished, well developed, in no acute distress  HEENT: normal  Neck: no JVD, carotid bruits, or masses Cardiac: RRR; no murmurs, rubs, or gallops,no edema  Respiratory:  clear to auscultation bilaterally, normal work of breathing GI: soft, nontender, nondistended, + BS MS: no deformity or atrophy  Skin: warm and dry, no rash Neuro:  Strength and sensation are intact Psych: euthymic mood, full affect    Recent Labs: 11/23/2019: TSH 1.411 11/24/2019:  BUN 8; Creatinine, Ser 0.59; Hemoglobin 13.3; Platelets 245; Potassium 3.5; Sodium 137 02/04/2020: ALT 14   Lipid Panel    Component Value Date/Time   CHOL 123 02/04/2020 1118   TRIG 112 02/04/2020 1118   HDL 42 02/04/2020 1118   CHOLHDL 2.9 02/04/2020 1118   CHOLHDL 4.4 11/24/2019 0217   VLDL 26 11/24/2019 0217   LDLCALC 61 02/04/2020 1118     Other studies Reviewed: Additional studies/ records that were reviewed today with results demonstrating: labs reviewed.   ASSESSMENT AND PLAN:  1. CAD/Old MI: High resting HR is concerning to him.  No angina. Continue aggressive secondary prevention.  No bleeding issues.  No NTG use.  No sx like prior MI.   2. Hyperlipidemia: LDL 61.  Continue current meds. Healthy diet recommended. 3. Elevated LFTs: Normal in 11/21. 4. Overweight: eating healthier.  Increasing fiber intake and decreasing sugar intake.  Stay more hydrated.  Has had some mild orthostatic sx.  5. DM: Controlled with diet and exercise.    Current medicines are reviewed at length with the patient today.  The patient concerns regarding his medicines were addressed.  The following changes have been made:  No change  Labs/ tests ordered today include:  No orders of the defined types were placed in this encounter.   Recommend 150 minutes/week of aerobic exercise Low fat, low carb, high fiber diet recommended  Disposition:   FU in 6  months   Signed, Larae Grooms, MD  05/06/2020 10:51 AM    [facility] [address], [place], [place]  [address] Phone: [phone]; Fax: [phone]

## 2020-05-06 ENCOUNTER — Ambulatory Visit: Payer: PRIVATE HEALTH INSURANCE | Admitting: Interventional Cardiology

## 2020-05-06 ENCOUNTER — Encounter: Payer: Self-pay | Admitting: Interventional Cardiology

## 2020-05-06 ENCOUNTER — Other Ambulatory Visit: Payer: Self-pay

## 2020-05-06 VITALS — BP 118/78 | HR 100 | Ht 69.0 in | Wt 178.2 lb

## 2020-05-06 DIAGNOSIS — E782 Mixed hyperlipidemia: Secondary | ICD-10-CM

## 2020-05-06 DIAGNOSIS — I25118 Atherosclerotic heart disease of native coronary artery with other forms of angina pectoris: Secondary | ICD-10-CM

## 2020-05-06 DIAGNOSIS — R7989 Other specified abnormal findings of blood chemistry: Secondary | ICD-10-CM

## 2020-05-06 DIAGNOSIS — E118 Type 2 diabetes mellitus with unspecified complications: Secondary | ICD-10-CM | POA: Diagnosis not present

## 2020-05-06 NOTE — Patient Instructions (Signed)
Medication Instructions:  Your physician recommends that you continue on your current medications as directed. Please refer to the Current Medication list given to you today.  *If you need a refill on your cardiac medications before your next appointment, please call your pharmacy*   Lab Work: none If you have labs (blood work) drawn today and your tests are completely normal, you will receive your results only by: Marland Kitchen MyChart Message (if you have MyChart) OR . A paper copy in the mail If you have any lab test that is abnormal or we need to change your treatment, we will call you to review the results.   Testing/Procedures: none   Follow-Up: At [facility], you and your health needs are our priority.  As part of our continuing mission to provide you with exceptional heart care, we have created designated Provider Care Teams.  These Care Teams include your primary Cardiologist (physician) and Advanced Practice Providers (APPs -  Physician Assistants and Nurse Practitioners) who all work together to provide you with the care you need, when you need it.  We recommend signing up for the patient portal called "MyChart".  Sign up information is provided on this After Visit Summary.  MyChart is used to connect with patients for Virtual Visits (Telemedicine).  Patients are able to view lab/test results, encounter notes, upcoming appointments, etc.  Non-urgent messages can be sent to your provider as well.   To learn more about what you can do with MyChart, go to [email or web address].    Your next appointment:   6 month(s)  The format for your next appointment:   In Person  Provider:   You may see Larae Grooms, MD or one of the following Advanced Practice Providers on your designated Care Team:    Melina Copa, PA-C  Ermalinda Barrios, PA-C    Other Instructions  Dr Irish Lack recommends you stay hydrated and follow a high fiber diet   High-Fiber Eating Plan Fiber, also called  dietary fiber, is a type of carbohydrate. It is found foods such as fruits, vegetables, whole grains, and beans. A high-fiber diet can have many health benefits. Your health care provider may recommend a high-fiber diet to help:  Prevent constipation. Fiber can make your bowel movements more regular.  Lower your cholesterol.  Relieve the following conditions: ? Inflammation of veins in the anus (hemorrhoids). ? Inflammation of specific areas of the digestive tract (uncomplicated diverticulosis). ? A problem of the large intestine, also called the colon, that sometimes causes pain and diarrhea (irritable bowel syndrome, or IBS).  Prevent overeating as part of a weight-loss plan.  Prevent heart disease, type 2 diabetes, and certain cancers. What are tips for following this plan? Reading food labels  Check the nutrition facts label on food products for the amount of dietary fiber. Choose foods that have 5 grams of fiber or more per serving.  The goals for recommended daily fiber intake include: ? Men (age 34 or younger): 34-38 g. ? Men (over age 10): 28-34 g. ? Women (age 70 or younger): 25-28 g. ? Women (over age 16): 22-25 g. Your daily fiber goal is _____________ g.   Shopping  Choose whole fruits and vegetables instead of processed forms, such as apple juice or applesauce.  Choose a wide variety of high-fiber foods such as avocados, lentils, oats, and kidney beans.  Read the nutrition facts label of the foods you choose. Be aware of foods with added fiber. These foods often have high sugar and  sodium amounts per serving. Cooking  Use whole-grain flour for baking and cooking.  Cook with brown rice instead of white rice. Meal planning  Start the day with a breakfast that is high in fiber, such as a cereal that contains 5 g of fiber or more per serving.  Eat breads and cereals that are made with whole-grain flour instead of refined flour or white flour.  Eat brown rice,  bulgur wheat, or millet instead of white rice.  Use beans in place of meat in soups, salads, and pasta dishes.  Be sure that half of the grains you eat each day are whole grains. General information  You can get the recommended daily intake of dietary fiber by: ? Eating a variety of fruits, vegetables, grains, nuts, and beans. ? Taking a fiber supplement if you are not able to take in enough fiber in your diet. It is better to get fiber through food than from a supplement.  Gradually increase how much fiber you consume. If you increase your intake of dietary fiber too quickly, you may have bloating, cramping, or gas.  Drink plenty of water to help you digest fiber.  Choose high-fiber snacks, such as berries, raw vegetables, nuts, and popcorn. What foods should I eat? Fruits Berries. Pears. Apples. Oranges. Avocado. Prunes and raisins. Dried figs. Vegetables Sweet potatoes. Spinach. Kale. Artichokes. Cabbage. Broccoli. Cauliflower. Green peas. Carrots. Squash. Grains Whole-grain breads. Multigrain cereal. Oats and oatmeal. Brown rice. Barley. Bulgur wheat. [place]. Quinoa. Bran muffins. Popcorn. Rye wafer crackers. Meats and other proteins Navy beans, kidney beans, and pinto beans. Soybeans. Split peas. Lentils. Nuts and seeds. Dairy Fiber-fortified yogurt. Beverages Fiber-fortified soy milk. Fiber-fortified orange juice. Other foods Fiber bars. The items listed above may not be a complete list of recommended foods and beverages. Contact a dietitian for more information. What foods should I avoid? Fruits Fruit juice. Cooked, strained fruit. Vegetables Fried potatoes. Canned vegetables. Well-cooked vegetables. Grains White bread. Pasta made with refined flour. White rice. Meats and other proteins Fatty cuts of meat. Fried chicken or fried fish. Dairy Milk. Yogurt. Cream cheese. Sour cream. Fats and oils Butters. Beverages Soft drinks. Other foods Cakes and pastries. The  items listed above may not be a complete list of foods and beverages to avoid. Talk with your dietitian about what choices are best for you. Summary  Fiber is a type of carbohydrate. It is found in foods such as fruits, vegetables, whole grains, and beans.  A high-fiber diet has many benefits. It can help to prevent constipation, lower blood cholesterol, aid weight loss, and reduce your risk of heart disease, diabetes, and certain cancers.  Increase your intake of fiber gradually. Increasing fiber too quickly may cause cramping, bloating, and gas. Drink plenty of water while you increase the amount of fiber you consume.  The best sources of fiber include whole fruits and vegetables, whole grains, nuts, seeds, and beans. This information is not intended to replace advice given to you by your health care provider. Make sure you discuss any questions you have with your health care provider. Document Revised: 07/09/2019 Document Reviewed: 07/09/2019 Elsevier Patient Education  2021 [employer].

## 2020-06-12 ENCOUNTER — Other Ambulatory Visit: Payer: Self-pay | Admitting: Student

## 2020-06-12 ENCOUNTER — Other Ambulatory Visit: Payer: Self-pay | Admitting: Interventional Cardiology

## 2020-06-29 ENCOUNTER — Telehealth: Payer: Self-pay | Admitting: *Deleted

## 2020-06-29 NOTE — Telephone Encounter (Signed)
   [facility] Pre-operative Risk Assessment    Patient Name: [name]  DOB: 1960-10-27  MRN: [id number]   HEARTCARE STAFF: - Please ensure there is not already an duplicate clearance open for this procedure. - Under Visit Info/Reason for Call, type in Other and utilize the format Clearance MM/DD/YY or Clearance TBD. Do not use dashes or single digits. - If request is for dental extraction, please clarify the # of teeth to be extracted.  Request for surgical clearance:  1. What type of surgery is being performed? COLONOSCOPY   2. When is this surgery scheduled? 08/03/20   3. What type of clearance is required (medical clearance vs. Pharmacy clearance to hold med vs. Both)? MEDICAL  4. Are there any medications that need to be held prior to surgery and how long? PLAVIX x 5 DAYS PRIOR   5. Practice name and name of physician performing surgery? [facility]; DR. Alessandra Bevels   6. What is the office phone number? [phone]   7.   What is the office fax number? [phone]  8.   Anesthesia type (None, local, MAC, general) ? PROPOFOL    Julaine Hua 06/29/2020, 9:57 AM  _________________________________________________________________   (provider comments below)

## 2020-06-30 NOTE — Telephone Encounter (Signed)
S/w Sherri, Dr. Leanna Sato surgery scheduler. Confirmed colonoscopy is not of urgent nature. Informed Sherri of Dr. Hassell Done recommendations if not urgent to hold off until 10/22 for colonoscopy. Pt has STEMI 11/2019 with stent placed

## 2020-06-30 NOTE — Telephone Encounter (Signed)
Sherri is in agreement with cardiologist recommendations. I will fax notes to Dr. Alessandra Bevels and Venida Jarvis. Colonoscopy will be postponed until 10/22.

## 2020-06-30 NOTE — Telephone Encounter (Signed)
Pre-op request for colonoscopy asking to hold Plavix 5 days prior to the procedure.   H/o LAD stent in 2008, fatty  Liver, HLD, pSVT. He has anterior MI in 9//6/21 and it showed critical stenosis of the midLD treated with PCI/DES overlapped with the DMS patent, severe 1st diag stenosis treated with DES, diffuse distal CAD involving LAD and PAD, mild nonobstructive leftmainstem and left Cx  Last seen 05/06/20 and reported chest twinges, overall stable.   Dr. Irish Lack, please comment on Plavix. And route answer to P [facility] PREOP.

## 2020-06-30 NOTE — Telephone Encounter (Signed)
Pre-op call back team, can we please call GI office and Lorenza Evangelist if this is an urgent colonoscopy or screening? If just screening, procedure needs to wait until October 2022 given stenting in 11/2019, per Dr. Nobie Putnam.

## 2020-06-30 NOTE — Telephone Encounter (Signed)
Since he is within 12 months of a stent placed for STEMI, would like him to wait to have screening colonoscopy until October 2022.  If the colonoscopy is more urgent, we can discuss holding Plavix.

## 2020-06-30 NOTE — Telephone Encounter (Signed)
Will remove from pre-op pool.

## 2020-08-07 ENCOUNTER — Other Ambulatory Visit: Payer: Self-pay

## 2020-08-07 ENCOUNTER — Emergency Department (HOSPITAL_COMMUNITY): Payer: PRIVATE HEALTH INSURANCE

## 2020-08-07 ENCOUNTER — Encounter (HOSPITAL_COMMUNITY): Payer: Self-pay

## 2020-08-07 ENCOUNTER — Emergency Department (HOSPITAL_COMMUNITY)
Admission: EM | Admit: 2020-08-07 | Discharge: 2020-08-07 | Disposition: A | Discharge status: Home / Self Care | Payer: PRIVATE HEALTH INSURANCE | Attending: Emergency Medicine | Admitting: Emergency Medicine

## 2020-08-07 DIAGNOSIS — I2510 Atherosclerotic heart disease of native coronary artery without angina pectoris: Secondary | ICD-10-CM | POA: Diagnosis not present

## 2020-08-07 DIAGNOSIS — R079 Chest pain, unspecified: Secondary | ICD-10-CM | POA: Diagnosis present

## 2020-08-07 DIAGNOSIS — Z7952 Long term (current) use of systemic steroids: Secondary | ICD-10-CM | POA: Insufficient documentation

## 2020-08-07 DIAGNOSIS — E1165 Type 2 diabetes mellitus with hyperglycemia: Secondary | ICD-10-CM | POA: Diagnosis not present

## 2020-08-07 DIAGNOSIS — J45909 Unspecified asthma, uncomplicated: Secondary | ICD-10-CM | POA: Diagnosis not present

## 2020-08-07 DIAGNOSIS — Z9861 Coronary angioplasty status: Secondary | ICD-10-CM | POA: Diagnosis not present

## 2020-08-07 DIAGNOSIS — R0789 Other chest pain: Secondary | ICD-10-CM

## 2020-08-07 DIAGNOSIS — R739 Hyperglycemia, unspecified: Secondary | ICD-10-CM

## 2020-08-07 LAB — BASIC METABOLIC PANEL
Anion gap: 11 (ref 5–15)
BUN: 19 mg/dL (ref 6–20)
CO2: 21 mmol/L — ABNORMAL LOW (ref 22–32)
Calcium: 9.7 mg/dL (ref 8.9–10.3)
Chloride: 104 mmol/L (ref 98–111)
Creatinine, Ser: 0.84 mg/dL (ref 0.61–1.24)
GFR, Estimated: >60 mL/min (ref >60)
Glucose, Bld: 273 mg/dL — ABNORMAL HIGH (ref 70–99)
Potassium: 3.9 mmol/L (ref 3.5–5.1)
Sodium: 136 mmol/L (ref 135–145)

## 2020-08-07 LAB — CBC
HCT: 42.8 % (ref 39.0–52.0)
Hemoglobin: 13.8 g/dL (ref 13.0–17.0)
MCH: 29.7 pg (ref 26.0–34.0)
MCHC: 32.2 g/dL (ref 30.0–36.0)
MCV: 92.2 fL (ref 80.0–100.0)
Platelets: 299 10*3/uL (ref 150–400)
RBC: 4.64 MIL/uL (ref 4.22–5.81)
RDW: 12.8 % (ref 11.5–15.5)
WBC: 8.3 10*3/uL (ref 4.0–10.5)
nRBC: 0 % (ref 0.0–0.2)

## 2020-08-07 LAB — TROPONIN I (HIGH SENSITIVITY)
Troponin I (High Sensitivity): 5 ng/L (ref <18)
Troponin I (High Sensitivity): 5 ng/L (ref <18)

## 2020-08-07 MED ORDER — KETOROLAC TROMETHAMINE 30 MG/ML IJ SOLN
30.0000 mg | Freq: Once | INTRAMUSCULAR | Status: DC
  Filled 2020-08-07: qty 1

## 2020-08-07 MED ORDER — ASPIRIN 81 MG PO CHEW
162.0000 mg | CHEWABLE_TABLET | Freq: Once | ORAL | Status: AC
  Administered 2020-08-07: 162 mg via ORAL
  Filled 2020-08-07: qty 2

## 2020-08-07 MED ORDER — KETOROLAC TROMETHAMINE 30 MG/ML IJ SOLN
30.0000 mg | Freq: Once | INTRAMUSCULAR | Status: AC
  Administered 2020-08-07: 30 mg via INTRAMUSCULAR

## 2020-08-07 NOTE — ED Provider Notes (Signed)
[facility] EMERGENCY DEPARTMENT Provider Note   CSN: [id number] Arrival date & time: 08/07/20  1026     History No chief complaint on file.   [name] is a 60 y.o. male.  Pt presents to the ED today with right sided cp.  Pt said sx started last night.  He took 2 nitro.  The first helped, but the 2nd did not.  He also took 1 of his hydrocodones.  The pt did have a heart attack in September.  He said this feels "about a third of what it felt then."  Pt initially thought it was msk because he had a good work out yesterday.  Pt denies any sob, f/c, cough, n/v, diaphoresis.        Past Medical History:  Diagnosis Date  . Anemia   . Ankylosing spondylitis ([place])   . Arthritis   . Asthma    SINCE AGE 67  . Colitis, ulcerative ([place])   . Coronary artery disease    Bare metal LAD stent 10/08  . Coronary atherosclerosis of native coronary artery 12/25/2012  . ED (erectile dysfunction)   . Elevated prostate specific antigen (PSA) 12/25/2012  . Esophageal reflux 12/25/2012  . Extrinsic asthma, unspecified 12/25/2012  . Fibromyalgia   . GERD (gastroesophageal reflux disease)   . Hypercalcemia 12/25/2012  . Hyperlipidemia   . Left sided ulcerative (chronic) colitis ([place]) 12/25/2012  . Mixed hyperlipidemia 12/25/2012  . Pneumonia    as a child  . Pure hypercholesterolemia 12/25/2012  . TMJ (dislocation of temporomandibular joint)     Patient Active Problem List   Diagnosis Date Noted  . Type 2 diabetes mellitus with complication, without long-term current use of insulin ([place]) 11/24/2019  . STEMI (ST elevation myocardial infarction) ([place]) 11/23/2019  . STEMI involving left anterior descending coronary artery ([place]) 11/23/2019  . Coronary artery disease   . Hyperlipidemia LDL goal <70   . Pure hypercholesterolemia 12/25/2012  . Coronary atherosclerosis of native coronary artery 12/25/2012  . Iron deficiency anemia, unspecified 12/25/2012  . Anemia, unspecified  12/25/2012  . Allergic rhinitis, cause unspecified 12/25/2012  . GERD (gastroesophageal reflux disease) 12/25/2012  . Left sided ulcerative (chronic) colitis ([place]) 12/25/2012  . Pain in joint, pelvic region and thigh 12/25/2012  . Other and unspecified hyperlipidemia 12/25/2012  . Other specified conditions influencing health status(V49.89) 12/25/2012  . Elevated prostate specific antigen (PSA) 12/25/2012  . Extrinsic asthma, unspecified 12/25/2012  . Hypercalcemia 12/25/2012  . Mixed hyperlipidemia 12/25/2012  . Ankylosing spondylitis ([place]) 12/25/2012    Past Surgical History:  Procedure Laterality Date  . COLONOSCOPY    . CORONARY/GRAFT ACUTE MI REVASCULARIZATION N/A 11/23/2019   Procedure: Coronary/Graft Acute MI Revascularization;  Surgeon: Sherren Mocha, MD;  Location: [facility];  Service: Cardiovascular;  Laterality: N/A;  . EYE SURGERY Bilateral   . heart stent    . LEFT HEART CATH AND CORONARY ANGIOGRAPHY N/A 11/23/2019   Procedure: LEFT HEART CATH AND CORONARY ANGIOGRAPHY;  Surgeon: Sherren Mocha, MD;  Location: [facility];  Service: Cardiovascular;  Laterality: N/A;  . skin cancer removed from left eyelid     . skull fracture after falling down stairs    . TONSILLECTOMY         Family History  Problem Relation Age of Onset  . Hypertension Mother   . Lung cancer Father   . GER disease Sister   . Emphysema Maternal Grandfather   . COPD Maternal Grandfather  Social History   Tobacco Use  . Smoking status: Never Smoker  . Smokeless tobacco: Former Network engineer  . Vaping Use: Never used  Substance Use Topics  . Alcohol use: Yes    Home Medications Prior to Admission medications   Medication Sig Start Date End Date Taking? Authorizing Provider  albuterol (PROVENTIL HFA;VENTOLIN HFA) 108 (90 BASE) MCG/ACT inhaler Inhale 2 puffs into the lungs every 6 (six) hours as needed for wheezing.   Yes [provider]  aspirin 81 MG  tablet Take 81 mg by mouth daily.    Yes [provider]  clopidogrel (PLAVIX) 75 MG tablet Take 1 tablet (75 mg total) by mouth daily with breakfast. 11/25/19  Yes Sande Rives E, PA-C  cyclobenzaprine (FLEXERIL) 5 MG tablet Take 5 mg by mouth at bedtime.  12/29/13  Yes [provider]  Evolocumab (REPATHA SURECLICK) 371 MG/ML SOAJ Inject 1 pen into the skin every 14 (fourteen) days.   Yes [provider]  ferrous sulfate 325 (65 FE) MG EC tablet Take 325 mg by mouth daily with breakfast.    Yes [provider]  gabapentin (NEURONTIN) 100 MG capsule Take 200 mg by mouth at bedtime. 11/28/16  Yes [provider]  HYDROcodone-acetaminophen (NORCO) 10-325 MG per tablet Take 0.5-1 tablets by mouth every 6 (six) hours as needed for severe pain.  01/22/14  Yes [provider]  loratadine (CLARITIN) 10 MG tablet Take 10 mg by mouth daily.   Yes [provider]  metFORMIN (GLUCOPHAGE-XR) 500 MG 24 hr tablet Take 500 mg by mouth daily. 07/17/20  Yes [provider]  metoprolol succinate (TOPROL-XL) 50 MG 24 hr tablet Take 1 tablet (50 mg total) by mouth daily. 12/02/19  Yes Weaver, Scott T, PA-C  Multiple Vitamins-Minerals (CENTRUM CARDIO) TABS Take 1 tablet by mouth 2 (two) times daily.    Yes [provider]  nitroGLYCERIN (NITROSTAT) 0.4 MG SL tablet Place 1 tablet (0.4 mg total) under the tongue every 5 (five) minutes as needed for chest pain. 11/24/19  Yes Sarajane Jews, Callie E, PA-C  pantoprazole (PROTONIX) 40 MG tablet TAKE 1 TABLET BY MOUTH EVERYDAY AT BEDTIME Patient taking differently: Take 40 mg by mouth at bedtime. 02/18/20  Yes Jettie Booze, MD  predniSONE (DELTASONE) 1 MG tablet Take 3 mg by mouth daily. 10/25/19  Yes [provider]  sulfaSALAzine (AZULFIDINE) 500 MG EC tablet Take 1,000 mg by mouth 2 (two) times daily.    Yes [provider]  theophylline (THEODUR) 300 MG 12 hr tablet Take 300 mg  by mouth 2 (two) times daily.   Yes [provider]  VASCEPA 1 g capsule TAKE 2 CAPSULES TWICE A DAY Patient taking differently: Take 2 g by mouth 2 (two) times daily. 06/14/20  Yes Jettie Booze, MD    Allergies    Patient has no known allergies.  Review of Systems   Review of Systems  Cardiovascular: Positive for chest pain.  All other systems reviewed and are negative.   Physical Exam Updated Vital Signs BP 126/78   Pulse 76   Temp 98 F (36.7 C) (Oral)   Resp 16   SpO2 99%   Physical Exam Vitals and nursing note reviewed.  Constitutional:      Appearance: Normal appearance.  HENT:     Head: Normocephalic and atraumatic.     Right Ear: External ear normal.     Left Ear: External ear normal.  Nose: Nose normal.     Mouth/Throat:     Mouth: Mucous membranes are moist.     Pharynx: Oropharynx is clear.  Eyes:     Extraocular Movements: Extraocular movements intact.     Conjunctiva/sclera: Conjunctivae normal.     Pupils: Pupils are equal, round, and reactive to light.  Cardiovascular:     Rate and Rhythm: Normal rate and regular rhythm.     Pulses: Normal pulses.     Heart sounds: Normal heart sounds.  Pulmonary:     Effort: Pulmonary effort is normal.     Breath sounds: Normal breath sounds.  Chest:    Abdominal:     General: Abdomen is flat. Bowel sounds are normal.     Palpations: Abdomen is soft.  Musculoskeletal:        General: Normal range of motion.     Cervical back: Normal range of motion and neck supple.  Skin:    General: Skin is warm.     Capillary Refill: Capillary refill takes less than 2 seconds.  Neurological:     General: No focal deficit present.     Mental Status: He is alert and oriented to person, place, and time.  Psychiatric:        Mood and Affect: Mood normal.        Behavior: Behavior normal.        Thought Content: Thought content normal.        Judgment: Judgment normal.     ED Results / Procedures /  Treatments   Labs (all labs ordered are listed, but only abnormal results are displayed) Labs Reviewed  BASIC METABOLIC PANEL - Abnormal; Notable for the following components:      Result Value   CO2 21 (*)    Glucose, Bld 273 (*)    All other components within normal limits  CBC  TROPONIN I (HIGH SENSITIVITY)  TROPONIN I (HIGH SENSITIVITY)    EKG EKG Interpretation  Date/Time:  Sunday Aug 07 2020 10:39:08 EDT Ventricular Rate:  101 PR Interval:  200 QRS Duration: 112 QT Interval:  362 QTC Calculation: 469 R Axis:   44 Text Interpretation: Sinus tachycardia Incomplete right bundle branch block Nonspecific ST abnormality Abnormal ECG No significant change since last tracing Confirmed by Isla Pence [phone]) on 08/07/2020 11:33:17 AM   Radiology DG Chest 2 View  Result Date: 08/07/2020 CLINICAL DATA:  RIGHT-sided chest pain and anemia. Radiating to RIGHT arm. EXAM: CHEST - 2 VIEW COMPARISON:  November 23, 2019. FINDINGS: Trachea midline. Cardiomediastinal contours and hilar structures are stable. Lungs are hyperinflated. No sign of lobar consolidation or evidence of pleural effusion. Healed rib fractures along the LEFT chest. Spinal degenerative changes. No acute skeletal findings on limited assessment. EKG leads stickers project over the chest. IMPRESSION: Findings of COPD without acute cardiopulmonary disease. Electronically Signed   By: Zetta Bills M.D.   On: 08/07/2020 11:39    Procedures Procedures   Medications Ordered in ED Medications  aspirin chewable tablet 162 mg (162 mg Oral Given 08/07/20 1210)  ketorolac (TORADOL) 30 MG/ML injection 30 mg (30 mg Intramuscular Given 08/07/20 1245)    ED Course  I have reviewed the triage vital signs and the nursing notes.  Pertinent labs & imaging results that were available during my care of the patient were reviewed by me and considered in my medical decision making (see chart for details).    MDM  Rules/Calculators/A&P  CP is better after toradol.  His description of his pain seems more msk than from his heart.  He has had 2 negative troponins and his EKG does not show anything new.  He is hyperglycemic and is a diabetic.  He is encouraged to work on his diet.  He is to return if worse.  F/u with pcp. Final Clinical Impression(s) / ED Diagnoses Final diagnoses:  Atypical chest pain  Hyperglycemia    Rx / DC Orders ED Discharge Orders    None       Isla Pence, MD 08/07/20 1457

## 2020-08-07 NOTE — ED Triage Notes (Signed)
Patient complains of cp that started last night and this am tingling to right arm. Patient did get some relief with 1 SL NTG and took 2 more with no change

## 2020-08-07 NOTE — ED Provider Notes (Signed)
Emergency Medicine Provider Triage Evaluation Note  [name] 60 y.o. male was evaluated in triage.  Pt complains of right-sided chest pain anemia this morning.  Patient reports initially thought was musculoskeletal as he did work out well yesterday but then states it started radiating to his arm.  He states he has not had any nausea, vomiting, diaphoresis.  No lightheadedness.  He states he took nitro x1 and improved his pain.  He took 2 additional nitro but states that did not help.  States pain is a dull ache and currently states is 4/10.  He took a baby aspirin earlier today.  History of MI in September.  He states some of the symptoms feel similar but states that other symptoms he has not had yet.  No recent fever, chills.  No abdominal pain.   Review of Systems  Positive: Chest pain Negative: Diaphoresis, abdominal pain, nausea/vomiting.  Physical Exam  BP 134/82   Pulse 70   Temp 98.2 F (36.8 C) (Oral)   Resp 18   Ht 5\' 4"  (1.626 m)   Wt 65.8 kg   SpO2 100%   BMI 24.89 kg/m  Gen:   Awake, no distress   HEENT:  Atraumatic  Resp:  Normal effort. Lungs CTAB. Able to speak in full sentences.  Cardiac:  Normal rate. 2+ radial pulse bilaterally.  Abd:   Nondistended, nontender  MSK:   Moves extremities without difficulty  Neuro:  Speech clear   Other:      Medical Decision Making  Medically screening exam initiated at 11:08 AM  Appropriate orders placed.  [name] was informed that the remainder of the evaluation will be completed by another provider, this initial triage assessment does not replace that evaluation. They are counseled that they will need to remain in the ED until the completion of their workup, including full H&P and results of any tests.  Risks of leaving the emergency department prior to completion of treatment were discussed. Patient was advised to inform ED staff if they are leaving before their treatment is complete. The patient acknowledged these  risks and time was allowed for questions.     The patient appears stable so that the remainder of the MSE may be completed by another provider.   Clinical Impression  Chest pain   Portions of this note were generated with Dragon dictation software. Dictation errors may occur despite best attempts at proofreading.      Volanda Napoleon, PA-C 08/07/20 1109    Carmin Muskrat, MD 08/07/20 [phone]

## 2020-08-23 ENCOUNTER — Other Ambulatory Visit: Payer: Self-pay | Admitting: Physician Assistant

## 2020-10-10 ENCOUNTER — Telehealth: Payer: Self-pay

## 2020-10-10 NOTE — Telephone Encounter (Signed)
CALLED AND SPOKE W/PT STATED THAT I AM WORKING ON REPATHA PA  AND PT VOICED UNDERSTANDING

## 2020-10-12 ENCOUNTER — Telehealth: Payer: Self-pay | Admitting: Interventional Cardiology

## 2020-10-12 MED ORDER — REPATHA SURECLICK 140 MG/ML ~~LOC~~ SOAJ: 1.0000 "pen " | SUBCUTANEOUS | 11 refills | Status: DC

## 2020-10-12 NOTE — Telephone Encounter (Signed)
Follow up:     Patient calling back from yesterday. Please advise

## 2020-10-12 NOTE — Telephone Encounter (Signed)
Lmom pt to call me back

## 2020-10-12 NOTE — Addendum Note (Signed)
Addended by: Zamaya Rapaport E on: 10/12/2020 11:49 AM   Modules accepted: Orders

## 2020-10-12 NOTE — Telephone Encounter (Addendum)
Received a fax that pt will need to have [place] filled at [facility] per insurance requirements. New rx has been sent in. Pt aware PA is pending. Also let him know that new pharmacy may need his copay card info to continue giving him $5 Repatha rate. He will call [facility] to get his copay card info.

## 2020-10-12 NOTE — Telephone Encounter (Signed)
Spoke with pt and he states that he was hoping to speak with [facility] as he had some more information for her about his Repatha.  Advised I will send message to her and have her follow up at her earliest convenience.

## 2020-10-17 MED ORDER — REPATHA SURECLICK 140 MG/ML ~~LOC~~ SOAJ: 1.0000 "pen " | SUBCUTANEOUS | 11 refills | Status: DC

## 2020-10-17 MED ORDER — REPATHA SURECLICK 140 MG/ML ~~LOC~~ SOAJ: 1.0000 "pen " | SUBCUTANEOUS | 0 refills | Status: DC

## 2020-10-17 NOTE — Addendum Note (Signed)
Addended by: Allean Found on: 10/17/2020 01:13 PM   Modules accepted: Orders

## 2020-10-17 NOTE — Addendum Note (Signed)
Addended by: Marcelle Overlie D on: 10/17/2020 02:59 PM   Modules accepted: Orders

## 2020-10-17 NOTE — Telephone Encounter (Signed)
Called pt and let them know that yes we can provide a sample

## 2020-10-17 NOTE — Telephone Encounter (Signed)
Pt called asking for sample of repatha I will route to [facility] as she is at that office and I am unaware of their sample stock

## 2020-10-25 ENCOUNTER — Telehealth: Payer: Self-pay

## 2020-10-25 NOTE — Telephone Encounter (Signed)
Called and spoke w/a rep regarding the repatha pa I completed and they stated that yes they have received it and its currently under review. I was thinking that they hadn't gotten it b/c they sent a fax stating that they needed a pa to be completed

## 2020-10-28 NOTE — Telephone Encounter (Signed)
Called and spoke w/a rep regarding the repatha pa I completed and they stated that it is still under review

## 2020-11-09 NOTE — Telephone Encounter (Signed)
Called and spke w/pt stated that they are approved to get the repatha from the [facility] but if they had previously had a copay card before that they will need to call: Repatha (evolocumab) - [place] [email or web address] [phone]

## 2020-11-10 ENCOUNTER — Other Ambulatory Visit: Payer: Self-pay | Admitting: Student

## 2021-02-05 ENCOUNTER — Other Ambulatory Visit: Payer: Self-pay | Admitting: Interventional Cardiology

## 2021-02-21 ENCOUNTER — Telehealth: Payer: Self-pay | Admitting: Pharmacist

## 2021-02-21 NOTE — Telephone Encounter (Signed)
Patient called asking if we take his insurance. I advised that he all his insurance to see if we are on his preferred provider list.

## 2021-03-01 ENCOUNTER — Other Ambulatory Visit: Payer: Self-pay | Admitting: Interventional Cardiology

## 2021-03-01 NOTE — Telephone Encounter (Signed)
Pt's pharmacy is requesting a refill on clopidogrel. This medication was D/C off of pt's medication list. I called pt and pt stated that he still takes this medication. Would Dr. Irish Lack like to reorder this medication? Please address

## 2021-03-03 ENCOUNTER — Ambulatory Visit: Payer: PRIVATE HEALTH INSURANCE | Admitting: Interventional Cardiology

## 2021-03-03 ENCOUNTER — Encounter: Payer: Self-pay | Admitting: Interventional Cardiology

## 2021-03-03 ENCOUNTER — Other Ambulatory Visit: Payer: Self-pay

## 2021-03-03 VITALS — BP 104/70 | HR 100 | Ht 69.0 in | Wt 181.4 lb

## 2021-03-03 DIAGNOSIS — R7989 Other specified abnormal findings of blood chemistry: Secondary | ICD-10-CM | POA: Diagnosis not present

## 2021-03-03 DIAGNOSIS — R972 Elevated prostate specific antigen [PSA]: Secondary | ICD-10-CM

## 2021-03-03 DIAGNOSIS — E782 Mixed hyperlipidemia: Secondary | ICD-10-CM

## 2021-03-03 DIAGNOSIS — I25118 Atherosclerotic heart disease of native coronary artery with other forms of angina pectoris: Secondary | ICD-10-CM

## 2021-03-03 DIAGNOSIS — E118 Type 2 diabetes mellitus with unspecified complications: Secondary | ICD-10-CM | POA: Diagnosis not present

## 2021-03-03 LAB — LIPID PANEL
Chol/HDL Ratio: 3.3 ratio (ref 0.0–5.0)
Cholesterol, Total: 136 mg/dL (ref 100–199)
HDL: 41 mg/dL (ref >39)
LDL Chol Calc (NIH): 64 mg/dL (ref 0–99)
Triglycerides: 184 mg/dL — ABNORMAL HIGH (ref 0–149)
VLDL Cholesterol Cal: 31 mg/dL (ref 5–40)

## 2021-03-03 LAB — COMPREHENSIVE METABOLIC PANEL
ALT: 19 IU/L (ref 0–44)
AST: 18 IU/L (ref 0–40)
Albumin/Globulin Ratio: 1.9 (ref 1.2–2.2)
Albumin: 4.8 g/dL (ref 3.8–4.9)
Alkaline Phosphatase: 87 IU/L (ref 44–121)
BUN/Creatinine Ratio: 18 (ref 10–24)
BUN: 14 mg/dL (ref 8–27)
Bilirubin Total: 0.4 mg/dL (ref 0.0–1.2)
CO2: 21 mmol/L (ref 20–29)
Calcium: 9.8 mg/dL (ref 8.6–10.2)
Chloride: 103 mmol/L (ref 96–106)
Creatinine, Ser: 0.78 mg/dL (ref 0.76–1.27)
Globulin, Total: 2.5 g/dL (ref 1.5–4.5)
Glucose: 169 mg/dL — ABNORMAL HIGH (ref 70–99)
Potassium: 4.1 mmol/L (ref 3.5–5.2)
Sodium: 140 mmol/L (ref 134–144)
Total Protein: 7.3 g/dL (ref 6.0–8.5)
eGFR: 102 mL/min/{1.73_m2} (ref >59)

## 2021-03-03 LAB — PSA: Prostate Specific Ag, Serum: 10.2 ng/mL — ABNORMAL HIGH (ref 0.0–4.0)

## 2021-03-03 LAB — HEMOGLOBIN A1C
Est. average glucose Bld gHb Est-mCnc: 163 mg/dL
Hgb A1c MFr Bld: 7.3 % — ABNORMAL HIGH (ref 4.8–5.6)

## 2021-03-03 MED ORDER — CLOPIDOGREL BISULFATE 75 MG PO TABS: 75.0000 mg | ORAL_TABLET | Freq: Every day | ORAL | 3 refills | Status: DC

## 2021-03-03 MED ORDER — NITROGLYCERIN 0.4 MG SL SUBL
0.4000 mg | SUBLINGUAL_TABLET | SUBLINGUAL | 6 refills | Status: AC | PRN
Start: 2021-03-03 — End: ?

## 2021-03-03 NOTE — Patient Instructions (Signed)
Medication Instructions:  Your physician recommends that you continue on your current medications as directed. Please refer to the Current Medication list given to you today.  *If you need a refill on your cardiac medications before your next appointment, please call your pharmacy*   Lab Work: Lab work to be done today--CMET, Lipids,PSA, A1C If you have labs (blood work) drawn today and your tests are completely normal, you will receive your results only by: [place] (if you have MyChart) OR A paper copy in the mail If you have any lab test that is abnormal or we need to change your treatment, we will call you to review the results.   Testing/Procedures: none   Follow-Up: At [facility], you and your health needs are our priority.  As part of our continuing mission to provide you with exceptional heart care, we have created designated Provider Care Teams.  These Care Teams include your primary Cardiologist (physician) and Advanced Practice Providers (APPs -  Physician Assistants and Nurse Practitioners) who all work together to provide you with the care you need, when you need it.  We recommend signing up for the patient portal called "MyChart".  Sign up information is provided on this After Visit Summary.  MyChart is used to connect with patients for Virtual Visits (Telemedicine).  Patients are able to view lab/test results, encounter notes, upcoming appointments, etc.  Non-urgent messages can be sent to your provider as well.   To learn more about what you can do with MyChart, go to [email or web address].    Your next appointment:   9 month(s)  The format for your next appointment:   In Person  Provider:   Larae Grooms, MD     Other Instructions

## 2021-03-03 NOTE — Progress Notes (Signed)
Cardiology Office Note   Date:  03/03/2021   ID:  [name], DOB July 14, 1960, MRN [id number]  PCP:  Maury Dus, MD    No chief complaint on file.  CAD  Wt Readings from Last 3 Encounters:  03/03/21 181 lb 6.4 oz (82.3 kg)  05/06/20 178 lb 3.2 oz (80.8 kg)  02/04/20 174 lb 9.6 oz (79.2 kg)       History of Present Illness: [name] is a 60 y.o. male    who had an LAD stent in 2008.   His angina was an exertional chest tightness.  It occurred with mowing the lawn.  It would stop with rest.  Sx resolved after the stent.  Of note, he was seen at [facility] ER and had a negative w/u just before his cardiac w/u with me.    He has had some issues with fatty liver.  His atorvastatin was stopped due to increased LFTs.  He was started on Crestor in 4/19.   He has had some palpitations earlier in 2019.  Holter monitor at that time showed: Normal sinus rhythm with occasional PACs and PVCs. Short runs of SVT, less than 5 beats typically. No sustained arrhtyhmias.   Continue medical therapy.  If symptoms worsen, could increase beta blocker.    Crestor was stopped in July 2019 due to increased LFTs.    Had anterior MI in 9/21 with cath showing: " Critical stenosis of the mid LAD and patient with anterior STEMI, treated successfully with PCI of the mid LAD using a drug-eluting stent (2.5 x 18 mm resolute Onyx) overlapped with the old bare-metal stent which remained patent. 2.  Severe first diagonal stenosis treated successfully with a 3.0 x 15 mm resolute Onyx DES 3.  Diffuse distal vessel coronary artery disease involving the LAD and PDA 4.  Mild nonobstructive left mainstem and left circumflex stenosis"   Sister passed away in 2023-01-26 from lung cancer; she was a nonsmoker.  He is looking forward to the [employer].   Denies : Chest pain. Dizziness. Leg edema. Nitroglycerin use. Orthopnea. Palpitations. Paroxysmal nocturnal dyspnea. Shortness of breath. Syncope.     He goes to the gym regularly and has no cardiac symptoms.    Past Medical History:  Diagnosis Date   Anemia    Ankylosing spondylitis (HCC)    Arthritis    Asthma    SINCE AGE 22   Colitis, ulcerative ([place])    Coronary artery disease    Bare metal LAD stent 10/08   Coronary atherosclerosis of native coronary artery 12/25/2012   ED (erectile dysfunction)    Elevated prostate specific antigen (PSA) 12/25/2012   Esophageal reflux 12/25/2012   Extrinsic asthma, unspecified 12/25/2012   Fibromyalgia    GERD (gastroesophageal reflux disease)    Hypercalcemia 12/25/2012   Hyperlipidemia    Left sided ulcerative (chronic) colitis ([place]) 12/25/2012   Mixed hyperlipidemia 12/25/2012   Pneumonia    as a child   Pure hypercholesterolemia 12/25/2012   TMJ (dislocation of temporomandibular joint)     Past Surgical History:  Procedure Laterality Date   COLONOSCOPY     CORONARY/GRAFT ACUTE MI REVASCULARIZATION N/A 11/23/2019   Procedure: Coronary/Graft Acute MI Revascularization;  Surgeon: Sherren Mocha, MD;  Location: [place] [facility];  Service: Cardiovascular;  Laterality: N/A;   EYE SURGERY Bilateral    heart stent     LEFT HEART CATH AND CORONARY ANGIOGRAPHY N/A 11/23/2019   Procedure: LEFT HEART  CATH AND CORONARY ANGIOGRAPHY;  Surgeon: Sherren Mocha, MD;  Location: [place] [facility];  Service: Cardiovascular;  Laterality: N/A;   skin cancer removed from left eyelid      skull fracture after falling down stairs     TONSILLECTOMY       Current Outpatient Medications  Medication Sig Dispense Refill   albuterol (PROVENTIL HFA;VENTOLIN HFA) 108 (90 BASE) MCG/ACT inhaler Inhale 2 puffs into the lungs every 6 (six) hours as needed for wheezing.     aspirin 81 MG tablet Take 81 mg by mouth daily.      clopidogrel (PLAVIX) 75 MG tablet Take 75 mg by mouth daily.     cyclobenzaprine (FLEXERIL) 5 MG tablet Take 5 mg by mouth at bedtime.      Evolocumab (REPATHA SURECLICK) 989 MG/ML SOAJ  Inject 1 pen into the skin every 14 (fourteen) days. 2 mL 11   ferrous sulfate 325 (65 FE) MG EC tablet Take 325 mg by mouth daily with breakfast.      gabapentin (NEURONTIN) 100 MG capsule Take 200 mg by mouth at bedtime.     HYDROcodone-acetaminophen (NORCO) 10-325 MG per tablet Take 0.5-1 tablets by mouth every 6 (six) hours as needed for severe pain.   0   loratadine (CLARITIN) 10 MG tablet Take 10 mg by mouth daily.     metFORMIN (GLUCOPHAGE-XR) 500 MG 24 hr tablet Take 500 mg by mouth daily.     metoprolol succinate (TOPROL-XL) 50 MG 24 hr tablet TAKE 1 TABLET BY MOUTH EVERY DAY 90 tablet 1   Multiple Vitamins-Minerals (CENTRUM CARDIO) TABS Take 1 tablet by mouth 2 (two) times daily.      nitroGLYCERIN (NITROSTAT) 0.4 MG SL tablet Place 1 tablet (0.4 mg total) under the tongue every 5 (five) minutes as needed for chest pain. 25 tablet 3   pantoprazole (PROTONIX) 40 MG tablet TAKE 1 TABLET (40 MG TOTAL) BY MOUTH DAILY. PLEASE SCHEDULE AN APPT. 30 tablet 2   predniSONE (DELTASONE) 1 MG tablet Take 3 mg by mouth daily.     sulfaSALAzine (AZULFIDINE) 500 MG EC tablet Take 1,000 mg by mouth 2 (two) times daily.      theophylline (THEODUR) 300 MG 12 hr tablet Take 300 mg by mouth 2 (two) times daily.     VASCEPA 1 g capsule TAKE 2 CAPSULES TWICE A DAY (Patient taking differently: Take 2 g by mouth 2 (two) times daily.) 120 capsule 8   No current facility-administered medications for this visit.    Allergies:   Patient has no known allergies.    Social History:  The patient  reports that he has never smoked. He has quit using smokeless tobacco. He reports current alcohol use.   Family History:  The patient's family history includes COPD in his maternal grandfather; Emphysema in his maternal grandfather; GER disease in his sister; Hypertension in his mother; Lung cancer in his father.    ROS:  Please see the history of present illness.   Otherwise, review of systems are positive for  intentional weight loss in early 2022.   All other systems are reviewed and negative.    PHYSICAL EXAM: VS:  BP 104/70    Pulse 100    Ht 5\' 9"  (1.753 m)    Wt 181 lb 6.4 oz (82.3 kg)    SpO2 94%    BMI 26.79 kg/m  , BMI Body mass index is 26.79 kg/m. GEN: Well nourished, well developed, in no acute distress  HEENT: normal Neck: no JVD, carotid bruits, or masses Cardiac: RRR; no murmurs, rubs, or gallops,no edema  Respiratory:  clear to auscultation bilaterally, normal work of breathing GI: soft, nontender, nondistended, + BS MS: no deformity or atrophy Skin: warm and dry, no rash Neuro:  Strength and sensation are intact Psych: euthymic mood, full affect   EKG:   The ekg ordered inMay demonstrates sinus tachycardia, no ST segment changes   Recent Labs: 08/07/2020: BUN 19; Creatinine, Ser 0.84; Hemoglobin 13.8; Platelets 299; Potassium 3.9; Sodium 136   Lipid Panel    Component Value Date/Time   CHOL 123 02/04/2020 1118   TRIG 112 02/04/2020 1118   HDL 42 02/04/2020 1118   CHOLHDL 2.9 02/04/2020 1118   CHOLHDL 4.4 11/24/2019 0217   VLDL 26 11/24/2019 0217   LDLCALC 61 02/04/2020 1118     Other studies Reviewed: Additional studies/ records that were reviewed today with results demonstrating: labs reviewed.   ASSESSMENT AND PLAN:  CAD/Old MI: No angina. Continue aggressive secondary prevention.  Continue clopidogrel. Hyperlipidemia: Check lipids.  Continue PCSK9 inhibitor.  DM: A1C 7.1.  High fiber diet.  Overweight: He has been exercising.   Lost weight down to about 170.  Then he felt like he was too late so he gained a little weight back. Elevated LFTs: Improved in 05/2020.  Check liver test today. Elevated PSA: Will recheck as he is trying to find a new primary care doctor.   Current medicines are reviewed at length with the patient today.  The patient concerns regarding his medicines were addressed.  The following changes have been made:  No change  Labs/ tests  ordered today include:  No orders of the defined types were placed in this encounter.   Recommend 150 minutes/week of aerobic exercise Low fat, low carb, high fiber diet recommended  Disposition:   FU in 9 months   Signed, Larae Grooms, MD  03/03/2021 11:01 AM    [address], [place], [place]  [address] Phone: [phone]; Fax: [phone]

## 2021-03-03 NOTE — Telephone Encounter (Signed)
Refill sent to pharmacy at office visit today

## 2021-03-12 ENCOUNTER — Other Ambulatory Visit: Payer: Self-pay | Admitting: Interventional Cardiology

## 2021-03-30 ENCOUNTER — Ambulatory Visit (INDEPENDENT_AMBULATORY_CARE_PROVIDER_SITE_OTHER): Payer: PRIVATE HEALTH INSURANCE | Admitting: Orthopedic Surgery

## 2021-03-30 ENCOUNTER — Other Ambulatory Visit: Payer: Self-pay

## 2021-03-30 ENCOUNTER — Encounter: Payer: Self-pay | Admitting: Orthopedic Surgery

## 2021-03-30 VITALS — BP 118/80 | HR 90 | Temp 96.2°F | Ht 67.0 in | Wt 186.0 lb

## 2021-03-30 DIAGNOSIS — K219 Gastro-esophageal reflux disease without esophagitis: Secondary | ICD-10-CM

## 2021-03-30 DIAGNOSIS — M450 Ankylosing spondylitis of multiple sites in spine: Secondary | ICD-10-CM

## 2021-03-30 DIAGNOSIS — K5180 Other ulcerative colitis without complications: Secondary | ICD-10-CM

## 2021-03-30 DIAGNOSIS — J4530 Mild persistent asthma, uncomplicated: Secondary | ICD-10-CM

## 2021-03-30 DIAGNOSIS — E663 Overweight: Secondary | ICD-10-CM

## 2021-03-30 DIAGNOSIS — E785 Hyperlipidemia, unspecified: Secondary | ICD-10-CM

## 2021-03-30 DIAGNOSIS — I2510 Atherosclerotic heart disease of native coronary artery without angina pectoris: Secondary | ICD-10-CM | POA: Diagnosis not present

## 2021-03-30 DIAGNOSIS — E1169 Type 2 diabetes mellitus with other specified complication: Secondary | ICD-10-CM

## 2021-03-30 MED ORDER — SULFASALAZINE 500 MG PO TBEC: 1000.0000 mg | DELAYED_RELEASE_TABLET | Freq: Two times a day (BID) | ORAL | 1 refills | Status: DC

## 2021-03-30 MED ORDER — METFORMIN HCL ER 500 MG PO TB24: 500.0000 mg | ORAL_TABLET | Freq: Every day | ORAL | 1 refills | Status: DC

## 2021-03-30 NOTE — Progress Notes (Signed)
Careteam: Patient Care Team: Maury Dus, MD as PCP - General (Family Medicine) Jettie Booze, MD as PCP - Cardiology (Cardiology)  Seen by: Windell Moulding, AGNP-C  PLACE OF SERVICE:  [place] Directive information Does Patient Have a Medical Advance Directive?: No, Would patient like information on creating a medical advance directive?: No - Patient declined  No Known Allergies  Chief Complaint  Patient presents with   New Patient (Initial Visit)    Patient presents today for a new patient appointment.     HPI: Patient is a 61 y.o. male seen today to establish as a new patient.   Previous provider was Dr. Maury Dus at [facility]. He had to find a new provider due to insurance coverage.He has lived in [place] since 1989, previously from [place]. Retired. Worked in [employer] (medical). Married. Lives in split level home, 6 steps.   Asthma- diagnosed at age 27, takes theophylline daily, uses albuterol about 2-3x monthly, also on Claritin. Not followed by pulmonary specialist. Asthma triggers are exercise and cutting grass. No recent hospitalizations for asthma.   T2DM- diagnosis not listed on paperwork. He does not believe he is diabetic. A1c 7.3 03/03/2021. Currently taking metformin daily. Does not check blood sugar at home. Reports diet low in sugar and carbs. Last eye exam 04/2020.   Ankylosing spondylitis- diagnosed at age 35, reports pain mostly to lower back and neck, followed by rheumatologist. Pain intermittent. Uses hydrocodone 4-5x/weekly, also takes flexeril. He goes to the gym a few times a week, reports exercise helps with pain.   CAD- STEMI 11/2019- stenosis to mid LAD, treated with PCI stent placement. Echo LVEF 55-60% 11/24/2019. He was started on asa, plavix, metoprolol and   HLD- he has been on Repatha since Mi  GERD- remains on protonix daily  Ulcerative colitis- diagnosed in 1992, he use to see [facility] but they do not take his insurance, uses  bathroom multiple times every morning, impacts quality of life, no past hospitalizations  No recent hospitalizations or injuries.   Past surgeries:  Stent- 2008/ 2021- Dr. Tsosie Billing Dr. Kelli Churn fracture- 1983  Tonsillectomy- 1970  Strabismus surgery- 1960  Past procedures:  Reports multiple [facility]oscopies at [facility]- last unknown  MRI prostate- 2017  Immunizations:  Requested from previous provider  He has completed covid series and one booster vaccination  Family history:  Father- deceased at age 42 due to lung cancer  Mother- age 22- high blood pressure  Sister- deceased at age 58 due to lung cancer  Social history:  Tobacco use- never smoked  Alcohol use: social drinker- may have a few beers every few months  Illicit drug use: no past or current drug use  Diet: eats 5-7 servings of fruits/vegetables a week, does not drink coffee or soda Exercise: does treadmill and lifts weights 3x/weekly  Advanced directives packet given.         Wants pneumoni avaccine    Fibromyalgia    Review of Systems:  Review of Systems  Constitutional:  Negative for chills, fever, malaise/fatigue and weight loss.  HENT:  Negative for hearing loss and sore throat.        Dentures  Eyes:  Negative for blurred vision and double vision.  Respiratory:  Positive for shortness of breath and wheezing. Negative for cough.   Cardiovascular:  Negative for chest pain and leg swelling.  Gastrointestinal:  Positive for heartburn. Negative for abdominal pain, blood in stool, constipation, diarrhea, nausea and  vomiting.  Genitourinary:  Negative for dysuria and frequency.  Musculoskeletal:  Positive for back pain. Negative for falls and myalgias.  Skin:  Negative for rash.  Neurological:  Negative for dizziness and headaches.  Psychiatric/Behavioral:  Negative for depression. The patient is not nervous/anxious and does not have insomnia.    Past Medical History:  Diagnosis Date   Anemia     Ankylosing spondylitis (HCC)    Arthritis    Asthma    SINCE AGE 64   Colitis, ulcerative ([place])    Coronary artery disease    Bare metal LAD stent 10/08   Coronary atherosclerosis of native coronary artery 12/25/2012   ED (erectile dysfunction)    Elevated prostate specific antigen (PSA) 12/25/2012   Esophageal reflux 12/25/2012   Extrinsic asthma, unspecified 12/25/2012   Fibromyalgia    GERD (gastroesophageal reflux disease)    Hypercalcemia 12/25/2012   Hyperlipidemia    Left sided ulcerative (chronic) colitis ([place]) 12/25/2012   Mixed hyperlipidemia 12/25/2012   Pneumonia    as a child   Pure hypercholesterolemia 12/25/2012   TMJ (dislocation of temporomandibular joint)    Past Surgical History:  Procedure Laterality Date   COLONOSCOPY     CORONARY/GRAFT ACUTE MI REVASCULARIZATION N/A 11/23/2019   Procedure: Coronary/Graft Acute MI Revascularization;  Surgeon: Sherren Mocha, MD;  Location: [place] [facility];  Service: Cardiovascular;  Laterality: N/A;   EYE SURGERY Bilateral    heart stent     LEFT HEART CATH AND CORONARY ANGIOGRAPHY N/A 11/23/2019   Procedure: LEFT HEART CATH AND CORONARY ANGIOGRAPHY;  Surgeon: Sherren Mocha, MD;  Location: [place] [facility];  Service: Cardiovascular;  Laterality: N/A;   skin cancer removed from left eyelid      skull fracture after falling down stairs     TONSILLECTOMY     Social History:   reports that he has never smoked. He has quit using smokeless tobacco. He reports current alcohol use. He reports that he does not use drugs.  Family History  Problem Relation Age of Onset   Hypertension Mother    Lung cancer Father    GER disease Sister    Emphysema Maternal Grandfather    COPD Maternal Grandfather     Medications: Patient's Medications  New Prescriptions   No medications on file  Previous Medications   ALBUTEROL (PROVENTIL HFA;VENTOLIN HFA) 108 (90 BASE) MCG/ACT INHALER    Inhale 2 puffs into the lungs every 6 (six) hours  as needed for wheezing.   ASPIRIN 81 MG TABLET    Take 81 mg by mouth daily.    CLOPIDOGREL (PLAVIX) 75 MG TABLET    Take 1 tablet (75 mg total) by mouth daily.   CYCLOBENZAPRINE (FLEXERIL) 5 MG TABLET    Take 5 mg by mouth at bedtime.    EVOLOCUMAB (REPATHA SURECLICK) 194 MG/ML SOAJ    Inject 1 pen into the skin every 14 (fourteen) days.   FERROUS SULFATE 325 (65 FE) MG EC TABLET    Take 325 mg by mouth daily with breakfast.    GABAPENTIN (NEURONTIN) 100 MG CAPSULE    Take 200 mg by mouth at bedtime.   HYDROCODONE-ACETAMINOPHEN (NORCO) 10-325 MG PER TABLET    Take 0.5-1 tablets by mouth every 6 (six) hours as needed for severe pain.    ICOSAPENT ETHYL (VASCEPA) 1 G CAPSULE    TAKE 2 CAPSULES BY MOUTH TWICE A DAY   LORATADINE (CLARITIN) 10 MG TABLET    Take 10 mg by  mouth daily.   METFORMIN (GLUCOPHAGE-XR) 500 MG 24 HR TABLET    Take 500 mg by mouth daily.   METOPROLOL SUCCINATE (TOPROL-XL) 50 MG 24 HR TABLET    TAKE 1 TABLET BY MOUTH EVERY DAY   MULTIPLE VITAMINS-MINERALS (CENTRUM CARDIO) TABS    Take 1 tablet by mouth 2 (two) times daily.    NITROGLYCERIN (NITROSTAT) 0.4 MG SL TABLET    Place 1 tablet (0.4 mg total) under the tongue every 5 (five) minutes as needed for chest pain.   PANTOPRAZOLE (PROTONIX) 40 MG TABLET    TAKE 1 TABLET (40 MG TOTAL) BY MOUTH DAILY. PLEASE SCHEDULE AN APPT.   PREDNISONE (DELTASONE) 1 MG TABLET    Take 3 mg by mouth daily.   SULFASALAZINE (AZULFIDINE) 500 MG EC TABLET    Take 1,000 mg by mouth 2 (two) times daily.    THEOPHYLLINE (THEODUR) 300 MG 12 HR TABLET    Take 300 mg by mouth 2 (two) times daily.  Modified Medications   No medications on file  Discontinued Medications   CHLORPHENIRAMINE MALEATE (ALLERGY PO)    Take 1 tablet by mouth daily.    Physical Exam:  Vitals:   03/30/21 1022  BP: 118/80  Pulse: 90  Temp: (!) 96.2 F (35.7 C)  SpO2: 96%  Weight: 186 lb (84.4 kg)  Height: 5\' 7"  (1.702 m)   Body mass index is 29.13 kg/m. Wt Readings  from Last 3 Encounters:  03/30/21 186 lb (84.4 kg)  03/03/21 181 lb 6.4 oz (82.3 kg)  05/06/20 178 lb 3.2 oz (80.8 kg)    Physical Exam Vitals reviewed.  Constitutional:      General: He is not in acute distress. HENT:     Head: Normocephalic.  Eyes:     General:        Right eye: No discharge.        Left eye: No discharge.  Neck:     Vascular: No carotid bruit.  Cardiovascular:     Rate and Rhythm: Normal rate and regular rhythm.     Pulses: Normal pulses.     Heart sounds: Normal heart sounds. No murmur heard. Pulmonary:     Effort: Pulmonary effort is normal. No respiratory distress.     Breath sounds: Normal breath sounds. No wheezing.  Abdominal:     General: Bowel sounds are normal. There is no distension.     Palpations: Abdomen is soft.     Tenderness: There is no abdominal tenderness.  Musculoskeletal:     Cervical back: Normal range of motion.     Right lower leg: No edema.     Left lower leg: No edema.  Lymphadenopathy:     Cervical: No cervical adenopathy.  Skin:    General: Skin is warm and dry.     Capillary Refill: Capillary refill takes less than 2 seconds.  Neurological:     General: No focal deficit present.     Mental Status: He is alert and oriented to person, place, and time.     Motor: No weakness.     Gait: Gait normal.  Psychiatric:        Mood and Affect: Mood normal.        Behavior: Behavior normal.    Labs reviewed: Basic Metabolic Panel: Recent Labs    08/07/20 1115 03/03/21 1122  NA 136 140  K 3.9 4.1  CL 104 103  CO2 21* 21  GLUCOSE 273* 169*  BUN 19 14  CREATININE 0.84 0.78  CALCIUM 9.7 9.8   Liver Function Tests: Recent Labs    03/03/21 1122  AST 18  ALT 19  ALKPHOS 87  BILITOT 0.4  PROT 7.3  ALBUMIN 4.8   No results for input(s): LIPASE, AMYLASE in the last 8760 hours. No results for input(s): AMMONIA in the last 8760 hours. CBC: Recent Labs    08/07/20 1115  WBC 8.3  HGB 13.8  HCT 42.8  MCV 92.2   PLT 299   Lipid Panel: Recent Labs    03/03/21 1122  CHOL 136  HDL 41  LDLCALC 64  TRIG 184*  CHOLHDL 3.3   TSH: No results for input(s): TSH in the last 8760 hours. A1C: Lab Results  Component Value Date   HGBA1C 7.3 (H) 03/03/2021     Assessment/Plan 1. Other ulcerative colitis without complication ([place]) - followed by [facility] in past- they do not accept his insurance - symptoms stable with sulfasalazine - Ambulatory referral to Gastroenterology - sulfaSALAzine (AZULFIDINE) 500 MG EC tablet; Take 2 tablets (1,000 mg total) by mouth 2 (two) times daily.  Dispense: 180 tablet; Refill: 1  2. Type 2 diabetes mellitus with other specified complication, without long-term current use of insulin (HCC) - A1c 7.3 03/03/2021 - cont asa, repatha, and metformin - oral prednisone on medication list?  - metFORMIN (GLUCOPHAGE-XR) 500 MG 24 hr tablet; Take 1 tablet (500 mg total) by mouth daily.  Dispense: 90 tablet; Refill: 1 - foot exam- future - diabetic eye exam- future  3. Atherosclerosis of native coronary artery of native heart without angina pectoris - followed by cardiology - no angina - NSTEMI 11/23/2019 - cont asa, plavix, repatha, vascepa, metoprolol  4. Hyperlipidemia LDL goal <70 - LDL 64 03/03/2021 - cont repatha  5. Gastroesophageal reflux disease without esophagitis - hgb stable - cont Protonix  6. Ankylosing spondylitis of multiple sites in spine [facility]) - followed by rheumatology - moves well, goes to gym - cont hydrocodone prn - cont flexeril qhs  7. Mild persistent asthma without complication - no recent exacerbations - cont theophylline and albuterol prn - cont Claritin - pneumococcal 23 vaccine- future  8. Overweight (BMI 25.0-29.0) - he has lost 170 lbs in past - cont exercise 3x weekly- recommend > 150 minutes/week  Total time: 51 minutes. Greater than 50% of total time spent doing patient on health maintenance, vaccinations, and symptom  management.   Future labs/tests: cbc/diff, bmp, pneumonia vaccine, diabetic foot exam  Next appt: 04/27/2021  Niel Hummer  [facility] [phone]

## 2021-03-30 NOTE — Patient Instructions (Addendum)
Consider shingles vaccine   Referral to GI made- [facility]- referral coordinator- Lattie Haw

## 2021-04-21 ENCOUNTER — Other Ambulatory Visit: Payer: Self-pay | Admitting: Physician Assistant

## 2021-04-27 ENCOUNTER — Encounter: Payer: Self-pay | Admitting: Orthopedic Surgery

## 2021-04-27 ENCOUNTER — Other Ambulatory Visit: Payer: Self-pay

## 2021-04-27 ENCOUNTER — Ambulatory Visit (INDEPENDENT_AMBULATORY_CARE_PROVIDER_SITE_OTHER): Payer: PRIVATE HEALTH INSURANCE | Admitting: Orthopedic Surgery

## 2021-04-27 VITALS — BP 126/82 | HR 94 | Temp 96.8°F | Ht 68.0 in | Wt 184.2 lb

## 2021-04-27 DIAGNOSIS — E785 Hyperlipidemia, unspecified: Secondary | ICD-10-CM | POA: Diagnosis not present

## 2021-04-27 DIAGNOSIS — K5180 Other ulcerative colitis without complications: Secondary | ICD-10-CM

## 2021-04-27 DIAGNOSIS — Z23 Encounter for immunization: Secondary | ICD-10-CM

## 2021-04-27 DIAGNOSIS — J4530 Mild persistent asthma, uncomplicated: Secondary | ICD-10-CM | POA: Diagnosis not present

## 2021-04-27 DIAGNOSIS — I2510 Atherosclerotic heart disease of native coronary artery without angina pectoris: Secondary | ICD-10-CM

## 2021-04-27 DIAGNOSIS — K219 Gastro-esophageal reflux disease without esophagitis: Secondary | ICD-10-CM

## 2021-04-27 DIAGNOSIS — Z1159 Encounter for screening for other viral diseases: Secondary | ICD-10-CM

## 2021-04-27 DIAGNOSIS — E1169 Type 2 diabetes mellitus with other specified complication: Secondary | ICD-10-CM

## 2021-04-27 NOTE — Progress Notes (Signed)
Careteam: Patient Care Team: Yvonna Alanis, NP as PCP - General (Adult Health Nurse Practitioner) Jettie Booze, MD as PCP - Cardiology (Cardiology)  Seen by: Windell Moulding, AGNP-C  PLACE OF SERVICE:  [place] Directive information Does Patient Have a Medical Advance Directive?: No, Would patient like information on creating a medical advance directive?: No - Patient declined  No Known Allergies  Chief Complaint  Patient presents with   Follow-up    4 week follow up. Discuss the need for shingrix, covid boosters, td/tdap, and flu vaccine or post pone if patient refuses. Foot exam today.      HPI: Patient is a 61 y.o. male seen today for medical management of chronic conditions.   Flu vaccine given today.   Diabetes- does not check sugars, continues to be on prednisone for UC, plans to schedule eye exam in future, diabetic foot exam performed today,  taking metformin daily  HLD- remains on Repatha, following low fat diet  Asthma- does not believe he has had pneumonia vaccine in past, he would like to have vaccine next visit, denies sob and wheezing today, remains on theophylline and albuterol  GERD- stable with Protonix  UC- continues to spend a few hours using bathroom, [facility] called him but he did not answer, he plans to call them to make appointment, remains on prednisone and Azulfidine  Still exercising 2-3 times weekly at [employer]. Continues to walk dog daily, weather permitting.   Review of Systems:  Review of Systems  Constitutional:  Negative for chills, fever, malaise/fatigue and weight loss.  HENT:  Negative for hearing loss and sore throat.   Eyes:  Negative for blurred vision and double vision.  Respiratory:  Positive for wheezing. Negative for cough and shortness of breath.   Cardiovascular:  Negative for chest pain and leg swelling.  Gastrointestinal:  Positive for diarrhea and heartburn. Negative for abdominal pain, blood in stool,  constipation, nausea and vomiting.  Genitourinary:  Negative for dysuria, frequency and hematuria.  Musculoskeletal:  Positive for back pain and myalgias. Negative for falls.  Skin:  Negative for rash.  Neurological:  Negative for dizziness, weakness and headaches.  Psychiatric/Behavioral:  Negative for depression. The patient is not nervous/anxious and does not have insomnia.    Past Medical History:  Diagnosis Date   Anemia    Ankylosing spondylitis (HCC)    Arthritis    Asthma    SINCE AGE 73   Colitis, ulcerative ([place])    Coronary artery disease    Bare metal LAD stent 10/08   Coronary atherosclerosis of native coronary artery 12/25/2012   ED (erectile dysfunction)    Elevated prostate specific antigen (PSA) 12/25/2012   Esophageal reflux 12/25/2012   Extrinsic asthma, unspecified 12/25/2012   Fibromyalgia    GERD (gastroesophageal reflux disease)    Hypercalcemia 12/25/2012   Hyperlipidemia    Left sided ulcerative (chronic) colitis ([place]) 12/25/2012   Mixed hyperlipidemia 12/25/2012   Pneumonia    as a child   Pure hypercholesterolemia 12/25/2012   TMJ (dislocation of temporomandibular joint)    Past Surgical History:  Procedure Laterality Date   COLONOSCOPY     CORONARY/GRAFT ACUTE MI REVASCULARIZATION N/A 11/23/2019   Procedure: Coronary/Graft Acute MI Revascularization;  Surgeon: Sherren Mocha, MD;  Location: [facility];  Service: Cardiovascular;  Laterality: N/A;   EYE SURGERY Bilateral    heart stent     LEFT HEART CATH AND CORONARY ANGIOGRAPHY N/A 11/23/2019  Procedure: LEFT HEART CATH AND CORONARY ANGIOGRAPHY;  Surgeon: Sherren Mocha, MD;  Location: [facility];  Service: Cardiovascular;  Laterality: N/A;   skin cancer removed from left eyelid      skull fracture after falling down stairs     TONSILLECTOMY     Social History:   reports that he has never smoked. He has quit using smokeless tobacco. He reports current alcohol use. He reports that he  does not use drugs.  Family History  Problem Relation Age of Onset   Hypertension Mother    Lung cancer Father    GER disease Sister    Emphysema Maternal Grandfather    COPD Maternal Grandfather     Medications: Patient's Medications  New Prescriptions   No medications on file  Previous Medications   ALBUTEROL (PROVENTIL HFA;VENTOLIN HFA) 108 (90 BASE) MCG/ACT INHALER    Inhale 2 puffs into the lungs every 6 (six) hours as needed for wheezing.   ASPIRIN 81 MG TABLET    Take 81 mg by mouth daily.    CLOPIDOGREL (PLAVIX) 75 MG TABLET    Take 1 tablet (75 mg total) by mouth daily.   CYCLOBENZAPRINE (FLEXERIL) 5 MG TABLET    Take 5 mg by mouth at bedtime.    EVOLOCUMAB (REPATHA SURECLICK) 841 MG/ML SOAJ    Inject 1 pen into the skin every 14 (fourteen) days.   FERROUS SULFATE 325 (65 FE) MG EC TABLET    Take 325 mg by mouth daily with breakfast.    GABAPENTIN (NEURONTIN) 100 MG CAPSULE    Take 200 mg by mouth at bedtime.   HYDROCODONE-ACETAMINOPHEN (NORCO) 10-325 MG PER TABLET    Take 0.5-1 tablets by mouth every 6 (six) hours as needed for severe pain.    ICOSAPENT ETHYL (VASCEPA) 1 G CAPSULE    TAKE 2 CAPSULES BY MOUTH TWICE A DAY   LORATADINE (CLARITIN) 10 MG TABLET    Take 10 mg by mouth daily.   METFORMIN (GLUCOPHAGE-XR) 500 MG 24 HR TABLET    Take 1 tablet (500 mg total) by mouth daily.   METOPROLOL SUCCINATE (TOPROL-XL) 50 MG 24 HR TABLET    TAKE 1 TABLET BY MOUTH EVERY DAY   MULTIPLE VITAMINS-MINERALS (CENTRUM CARDIO) TABS    Take 1 tablet by mouth 2 (two) times daily.    NITROGLYCERIN (NITROSTAT) 0.4 MG SL TABLET    Place 1 tablet (0.4 mg total) under the tongue every 5 (five) minutes as needed for chest pain.   PANTOPRAZOLE (PROTONIX) 40 MG TABLET    TAKE 1 TABLET (40 MG TOTAL) BY MOUTH DAILY. PLEASE SCHEDULE AN APPT.   PREDNISONE (DELTASONE) 1 MG TABLET    Take 3 mg by mouth daily.   SULFASALAZINE (AZULFIDINE) 500 MG EC TABLET    Take 2 tablets (1,000 mg total) by mouth 2  (two) times daily.   THEOPHYLLINE (THEODUR) 300 MG 12 HR TABLET    Take 300 mg by mouth 2 (two) times daily.  Modified Medications   No medications on file  Discontinued Medications   No medications on file    Physical Exam:  Vitals:   04/27/21 1125  Pulse: 94  Temp: (!) 96.8 F (36 C)  TempSrc: Temporal  SpO2: 97%  Weight: 184 lb 3.2 oz (83.6 kg)  Height: 5\' 8"  (1.727 m)   Body mass index is 28.01 kg/m. Wt Readings from Last 3 Encounters:  04/27/21 184 lb 3.2 oz (83.6 kg)  03/30/21 186 lb (84.4  kg)  03/03/21 181 lb 6.4 oz (82.3 kg)    Physical Exam Vitals reviewed.  Constitutional:      General: He is not in acute distress. HENT:     Head: Normocephalic.  Eyes:     General:        Right eye: No discharge.        Left eye: No discharge.  Neck:     Vascular: No carotid bruit.  Cardiovascular:     Rate and Rhythm: Normal rate and regular rhythm.     Pulses:          Dorsalis pedis pulses are 1+ on the right side and 1+ on the left side.       Posterior tibial pulses are 1+ on the right side and 1+ on the left side.     Heart sounds: Normal heart sounds. No murmur heard. Pulmonary:     Effort: Pulmonary effort is normal. No respiratory distress.     Breath sounds: Normal breath sounds. No wheezing.  Abdominal:     General: Bowel sounds are normal. There is no distension.     Palpations: Abdomen is soft.     Tenderness: There is no abdominal tenderness.  Musculoskeletal:     Cervical back: Neck supple.     Right lower leg: No edema.     Left lower leg: No edema.  Feet:     Right foot:     Protective Sensation: 10 sites tested.  10 sites sensed.     Skin integrity: Skin integrity normal.     Toenail Condition: Right toenails are normal.     Left foot:     Protective Sensation: 10 sites tested.  10 sites sensed.     Skin integrity: Skin integrity normal.     Toenail Condition: Left toenails are normal.  Lymphadenopathy:     Cervical: No cervical  adenopathy.  Skin:    General: Skin is warm and dry.  Neurological:     General: No focal deficit present.     Mental Status: He is alert and oriented to person, place, and time.     Motor: No weakness.     Gait: Gait normal.  Psychiatric:        Mood and Affect: Mood normal.        Behavior: Behavior normal.    Labs reviewed: Basic Metabolic Panel: Recent Labs    08/07/20 1115 03/03/21 1122  NA 136 140  K 3.9 4.1  CL 104 103  CO2 21* 21  GLUCOSE 273* 169*  BUN 19 14  CREATININE 0.84 0.78  CALCIUM 9.7 9.8   Liver Function Tests: Recent Labs    03/03/21 1122  AST 18  ALT 19  ALKPHOS 87  BILITOT 0.4  PROT 7.3  ALBUMIN 4.8   No results for input(s): LIPASE, AMYLASE in the last 8760 hours. No results for input(s): AMMONIA in the last 8760 hours. CBC: Recent Labs    08/07/20 1115  WBC 8.3  HGB 13.8  HCT 42.8  MCV 92.2  PLT 299   Lipid Panel: Recent Labs    03/03/21 1122  CHOL 136  HDL 41  LDLCALC 64  TRIG 184*  CHOLHDL 3.3   TSH: No results for input(s): TSH in the last 8760 hours. A1C: Lab Results  Component Value Date   HGBA1C 7.3 (H) 03/03/2021     Assessment/Plan 1. Need for influenza vaccination - Flu Vaccine QUAD High Dose(Fluad)  2. Type 2 diabetes  mellitus with other specified complication, without long-term current use of insulin (HCC) - A1c 7.3 03/03/2021 - cont asa, metformin, statin - discuss ACE for kidney protection next visit - on low dose prednisone for UC - Microalbumin/Creatinine Ratio, Urine - CMP  3. Hyperlipidemia LDL goal <70 - LDL 64 03/03/2021 - cont Repatha  4. Mild persistent asthma without complication - no exacerbations - cont theophylline, albuterol and claritin  5. Gastroesophageal reflux disease without esophagitis - cont Protonix - CBC with Differential/Platelet  6. Need for hepatitis C screening test - Hep C Antibody  7. Other ulcerative colitis without complication ([place]) - GI referral made  last visit - cont low dose prednisone- he is now diabetic/ discuss with GI - cont sulfasalazine   8. Atherosclerosis of native artery of native heart without angina pectoris - followed by cardiology - NSTEMI 11/23/2019 - cont asa, Plavix, repatha, vascepa, metoprolol  Total time: 31 minutes. Greater than 50% of total time spent doing patient education regarding diabetes, asthma,  ulcerative colitis, and health maintenance  Labs/tests: discuss ACE inhibitor, need pneumonia vaccine  Next appt: Visit date not found  [place] [phone]

## 2021-04-27 NOTE — Patient Instructions (Signed)
Please schedule diabetic eye exam with eye doctor- please have them fax note to [facility]

## 2021-04-28 LAB — COMPREHENSIVE METABOLIC PANEL WITH GFR
AG Ratio: 1.6 (calc) (ref 1.0–2.5)
ALT: 19 U/L (ref 9–46)
AST: 15 U/L (ref 10–35)
Albumin: 4.5 g/dL (ref 3.6–5.1)
Alkaline phosphatase (APISO): 73 U/L (ref 35–144)
BUN: 17 mg/dL (ref 7–25)
CO2: 28 mmol/L (ref 20–32)
Calcium: 10.5 mg/dL — ABNORMAL HIGH (ref 8.6–10.3)
Chloride: 103 mmol/L (ref 98–110)
Creat: 0.88 mg/dL (ref 0.70–1.35)
Globulin: 2.9 g/dL (ref 1.9–3.7)
Glucose, Bld: 202 mg/dL — ABNORMAL HIGH (ref 65–99)
Potassium: 4.2 mmol/L (ref 3.5–5.3)
Sodium: 139 mmol/L (ref 135–146)
Total Bilirubin: 0.6 mg/dL (ref 0.2–1.2)
Total Protein: 7.4 g/dL (ref 6.1–8.1)

## 2021-04-28 LAB — HEPATITIS C ANTIBODY
Hepatitis C Ab: NONREACTIVE
SIGNAL TO CUT-OFF: 0.05 (ref <1.00)

## 2021-04-28 LAB — CBC WITH DIFFERENTIAL/PLATELET
Absolute Monocytes: 483 cells/uL (ref 200–950)
Basophils Absolute: 147 cells/uL (ref 0–200)
Basophils Relative: 2.1 %
Eosinophils Absolute: 539 cells/uL — ABNORMAL HIGH (ref 15–500)
Eosinophils Relative: 7.7 %
HCT: 44.9 % (ref 38.5–50.0)
Hemoglobin: 15 g/dL (ref 13.2–17.1)
Lymphs Abs: 1729 cells/uL (ref 850–3900)
MCH: 30.5 pg (ref 27.0–33.0)
MCHC: 33.4 g/dL (ref 32.0–36.0)
MCV: 91.3 fL (ref 80.0–100.0)
MPV: 9.5 fL (ref 7.5–12.5)
Monocytes Relative: 6.9 %
Neutro Abs: 4102 cells/uL (ref 1500–7800)
Neutrophils Relative %: 58.6 %
Platelets: 289 10*3/uL (ref 140–400)
RBC: 4.92 10*6/uL (ref 4.20–5.80)
RDW: 11.8 % (ref 11.0–15.0)
Total Lymphocyte: 24.7 %
WBC: 7 10*3/uL (ref 3.8–10.8)

## 2021-04-28 LAB — MICROALBUMIN / CREATININE URINE RATIO
Creatinine, Urine: 150 mg/dL (ref 20–320)
Microalb Creat Ratio: 28 mcg/mg creat (ref <30)
Microalb, Ur: 4.2 mg/dL

## 2021-05-09 ENCOUNTER — Encounter: Payer: Self-pay | Admitting: Physician Assistant

## 2021-05-18 NOTE — Progress Notes (Signed)
05/19/2021 [name] [id number] 1960-12-07   ASSESSMENT AND PLAN:   Left sided ulcerative (chronic) colitis ([place]) -     DG Abd 1 View; Future -     Calprotectin, Fecal; Future -     Sedimentation rate; Future -     C-reactive protein; Future Please get records from [place] of last colon Schedule for colonoscopy- patient states he is due We have discussed the risks of bleeding, infection, perforation, medication reactions, and remote risk of death associated with colonoscopy. All questions were answered and the patient acknowledges these risk and wishes to proceed. Will suggest DEXA with prednisone use Check inflammatory labs Refilled sulfasalzine, pending labs and colon can reevaluate medications.  No loose stools right now, possible more constipation versus tenesmus, will get KUB to evaluate, given dicyclomine.   Gastroesophageal reflux disease without esophagitis Worsening by prednisone use likely, controlled with protonix 40 mg once a day. No dysphagia.   Ankylosing spondylitis of multiple sites in spine [facility]) Follows Dr. Amil Amen  Atherosclerosis of native coronary artery of native heart without angina pectoris Patient told to hold his Plavix for 5 days prior to time of procedure.  We will communicate with his prescribing physician Dr. Irish Lack to ensure that holding his Plavix is acceptable for him.  Alternatives to the procedure were discussed as well as risks and benefits of procedure including bleeding, perforation, infection, missed lesions, medication reactions and possible hospitalization or surgery if complications occur explained. Additional rare but real risk of cardiovascular event such as heart attack or ischemia/infarct of other organs off Plavix explained and need to seek urgent help if this occurs.  He is agreeable and wishes to proceed.   Type 2 diabetes mellitus with complication, without long-term current use of insulin (HCC) Continue to monitor with  prednisone use  Medication management -     Hepatitis B surface antigen; Future -     QuantiFERON-TB Gold Plus; Future  Other ulcerative colitis without complication (HCC) -     sulfaSALAzine (AZULFIDINE) 500 MG EC tablet; Take 2 tablets (1,000 mg total) by mouth 2 (two) times daily. -     dicyclomine (BENTYL) 20 MG tablet; Take 1 tablet (20 mg total) by mouth 3 (three) times daily as needed for spasms.   Patient Care Team: Yvonna Alanis, NP as PCP - General (Adult Health Nurse Practitioner) Jettie Booze, MD as PCP - Cardiology (Cardiology)  HISTORY OF PRESENT ILLNESS: 61 y.o. male referred by Yvonna Alanis, NP, with a past medical history of Type 2 diabetes, coronary artery disease status post STEMI LAD 2009 and 12/07/2019, reflux, left-sided ulcerative colitis and ankylosing spondylitis  and others listed below presents for evaluation of ulcerative colitis.  Patient follows with Dr. Irish Lack and is on Plavix and low-dose aspirin.  Last catheterization 11/2017 with critical stenosis mid LAD status post DES stenting, severe first diagonal status post DES, diffuse distal CAD involving LAD and PDA and mild nonobstructive left main and left circumflex  Patient was previously seen by Dr. Alessandra Bevels at [place], due to change in insurance he is transferring care here.  He was diagnosed with UC in 1992. He has Ankylosing spondylitis at diagnosed at age 73, follows rheum, on hydrocodone and flexeril.  Patient has prednisone 3 mg daily x 20-25 years, subsequently diabetic, on metofmrin. May need DEXA. He tried to get off of it once but had severe arthritis. This is the lowest he has had in years, has been on  3 mg for at least 5 years.  Patient did do IV for ankylosing spondylitis x 1 year, that was not helpful with Dr. Amil Amen.  He has been on sulfasalazine 1000 mg twice daily for 20-25 years, when he is off of it he has more frequent.  So for 15-20 years in the morning he will be in the  morning for at least 2 hours. 3-4 BM's in the morning the most.  Stools are formed, hard stools with occ straining.  When he finishes with BM in the morning, will have urge to have BM. Can have some AB pain with BM, no rectal pain.  Very rare BRB blood in stool, small volume in toliet.  Not diet related.  The patient does not have fever.  The patient does not have weight loss.   No rashes, no changes in vision.  Last colonoscopy was 4-5 year ago.   04/27/2021 WBC 7.0 HGB 15.0 MCV 91.3 Platelets 289 Biologics:  has not had TB GOLD and HepBsAG no results   Patient also has history of LFT elevation with statins, history of fatty liver, currently on Repatha. 04/27/2021 AST 15 ALT 19 Alkphos 87 TBili 0.6  Current Medications:   Current Outpatient Medications (Endocrine & Metabolic):    metFORMIN (GLUCOPHAGE-XR) 500 MG 24 hr tablet, Take 1 tablet (500 mg total) by mouth daily.   predniSONE (DELTASONE) 1 MG tablet, Take 3 mg by mouth daily.  Current Outpatient Medications (Cardiovascular):    Evolocumab (REPATHA SURECLICK) 161 MG/ML SOAJ, Inject 1 pen into the skin every 14 (fourteen) days.   icosapent Ethyl (VASCEPA) 1 g capsule, TAKE 2 CAPSULES BY MOUTH TWICE A DAY   metoprolol succinate (TOPROL-XL) 50 MG 24 hr tablet, TAKE 1 TABLET BY MOUTH EVERY DAY   nitroGLYCERIN (NITROSTAT) 0.4 MG SL tablet, Place 1 tablet (0.4 mg total) under the tongue every 5 (five) minutes as needed for chest pain.  Current Outpatient Medications (Respiratory):    albuterol (PROVENTIL HFA;VENTOLIN HFA) 108 (90 BASE) MCG/ACT inhaler, Inhale 2 puffs into the lungs every 6 (six) hours as needed for wheezing.   loratadine (CLARITIN) 10 MG tablet, Take 10 mg by mouth daily.   theophylline (THEODUR) 300 MG 12 hr tablet, Take 300 mg by mouth 2 (two) times daily.  Current Outpatient Medications (Analgesics):    aspirin 81 MG tablet, Take 81 mg by mouth daily.    HYDROcodone-acetaminophen (NORCO) 10-325 MG per tablet, Take  0.5-1 tablets by mouth every 6 (six) hours as needed for severe pain.   Current Outpatient Medications (Hematological):    clopidogrel (PLAVIX) 75 MG tablet, Take 1 tablet (75 mg total) by mouth daily.   Ferrous Gluconate (IRON 27 PO), Take 1 tablet by mouth daily.   ferrous sulfate 325 (65 FE) MG EC tablet, Take 325 mg by mouth daily with breakfast.   Current Outpatient Medications (Other):    cyclobenzaprine (FLEXERIL) 5 MG tablet, Take 5 mg by mouth at bedtime.    dicyclomine (BENTYL) 20 MG tablet, Take 1 tablet (20 mg total) by mouth 3 (three) times daily as needed for spasms.   gabapentin (NEURONTIN) 100 MG capsule, Take 200 mg by mouth at bedtime.   Multiple Vitamins-Minerals (CENTRUM CARDIO) TABS, Take 1 tablet by mouth 2 (two) times daily.    pantoprazole (PROTONIX) 40 MG tablet, TAKE 1 TABLET (40 MG TOTAL) BY MOUTH DAILY. PLEASE SCHEDULE AN APPT.   sulfaSALAzine (AZULFIDINE) 500 MG EC tablet, Take 2 tablets (1,000 mg total) by mouth 2 (  two) times daily.  Medical History:  Past Medical History:  Diagnosis Date   Anemia    Ankylosing spondylitis ([place])    Arthritis    Asthma    SINCE AGE 1   Colitis, ulcerative ([place])    Coronary artery disease    Bare metal LAD stent 10/08   Coronary atherosclerosis of native coronary artery 12/25/2012   ED (erectile dysfunction)    Elevated prostate specific antigen (PSA) 12/25/2012   Esophageal reflux 12/25/2012   Extrinsic asthma, unspecified 12/25/2012   Fibromyalgia    GERD (gastroesophageal reflux disease)    History of heart attack    Hypercalcemia 12/25/2012   Hyperlipidemia    Left sided ulcerative (chronic) colitis ([place]) 12/25/2012   Mixed hyperlipidemia 12/25/2012   Pneumonia    as a child   Pure hypercholesterolemia 12/25/2012   Skin cancer of eyelid    TMJ (dislocation of temporomandibular joint)    Allergies: No Known Allergies   Surgical History:  He  has a past surgical history that includes Tonsillectomy; heart  stent; skull fracture after falling down stairs; Eye surgery (Bilateral); Colonoscopy; skin cancer removed from left eyelid ; Coronary/Graft Acute MI Revascularization (N/A, 11/23/2019); and LEFT HEART CATH AND CORONARY ANGIOGRAPHY (N/A, 11/23/2019). Family History:  His family history includes Brain cancer in his sister; COPD in his maternal grandfather; Diabetes in his maternal grandmother; Emphysema in his maternal grandfather; GER disease in his sister; Hypertension in his mother; Lung cancer in his father and sister. Social History:   reports that he has never smoked. He has quit using smokeless tobacco.  His smokeless tobacco use included snuff. He reports current alcohol use. He reports that he does not use drugs.  REVIEW OF SYSTEMS  : All other systems reviewed and negative except where noted in the History of Present Illness.   PHYSICAL EXAM: BP 94/62    Pulse 93    Ht 5\' 8"  (1.727 m)    Wt 184 lb 4 oz (83.6 kg)    BMI 28.02 kg/m  General:   Pleasant, well developed male in no acute distress Head:  Normocephalic and atraumatic. Eyes: sclerae anicteric,conjunctive pink  Heart:  regular rate and rhythm Pulm: Clear anteriorly; no wheezing Abdomen:  Soft, Obese AB, skin exam normal, Normal bowel sounds.  no  tenderness . Without guarding and Without rebound, without hepatomegaly. Rectal: declines Extremities:  Without edema. Msk:  Symmetrical without gross deformities. Peripheral pulses intact.  Neurologic:  Alert and  oriented x4;  grossly normal neurologically. Skin:   Dry and intact without significant lesions or rashes. Psychiatric: Demonstrates good judgement and reason without abnormal affect or behaviors.   Vladimir Crofts, PA-C 3:14 PM

## 2021-05-19 ENCOUNTER — Other Ambulatory Visit: Payer: Self-pay

## 2021-05-19 ENCOUNTER — Other Ambulatory Visit: Payer: Self-pay | Admitting: Orthopedic Surgery

## 2021-05-19 ENCOUNTER — Ambulatory Visit (INDEPENDENT_AMBULATORY_CARE_PROVIDER_SITE_OTHER)
Admission: RE | Admit: 2021-05-19 | Discharge: 2021-05-19 | Disposition: A | Discharge status: Home / Self Care | Payer: PRIVATE HEALTH INSURANCE | Source: Ambulatory Visit | Attending: Physician Assistant | Admitting: Physician Assistant

## 2021-05-19 ENCOUNTER — Encounter: Payer: Self-pay | Admitting: Physician Assistant

## 2021-05-19 ENCOUNTER — Ambulatory Visit: Payer: PRIVATE HEALTH INSURANCE | Admitting: Physician Assistant

## 2021-05-19 ENCOUNTER — Other Ambulatory Visit (INDEPENDENT_AMBULATORY_CARE_PROVIDER_SITE_OTHER): Payer: PRIVATE HEALTH INSURANCE

## 2021-05-19 ENCOUNTER — Other Ambulatory Visit: Payer: Self-pay | Admitting: Interventional Cardiology

## 2021-05-19 ENCOUNTER — Telehealth: Payer: Self-pay

## 2021-05-19 VITALS — BP 94/62 | HR 93 | Ht 68.0 in | Wt 184.2 lb

## 2021-05-19 DIAGNOSIS — K219 Gastro-esophageal reflux disease without esophagitis: Secondary | ICD-10-CM | POA: Diagnosis not present

## 2021-05-19 DIAGNOSIS — E118 Type 2 diabetes mellitus with unspecified complications: Secondary | ICD-10-CM

## 2021-05-19 DIAGNOSIS — Z79899 Other long term (current) drug therapy: Secondary | ICD-10-CM

## 2021-05-19 DIAGNOSIS — K5150 Left sided colitis without complications: Secondary | ICD-10-CM

## 2021-05-19 DIAGNOSIS — I2510 Atherosclerotic heart disease of native coronary artery without angina pectoris: Secondary | ICD-10-CM

## 2021-05-19 DIAGNOSIS — M450 Ankylosing spondylitis of multiple sites in spine: Secondary | ICD-10-CM | POA: Diagnosis not present

## 2021-05-19 DIAGNOSIS — K5180 Other ulcerative colitis without complications: Secondary | ICD-10-CM

## 2021-05-19 LAB — SEDIMENTATION RATE: Sed Rate: 23 mm/hr — ABNORMAL HIGH (ref 0–20)

## 2021-05-19 LAB — C-REACTIVE PROTEIN: CRP: 1.5 mg/dL (ref 0.5–20.0)

## 2021-05-19 MED ORDER — DICYCLOMINE HCL 20 MG PO TABS
20.0000 mg | ORAL_TABLET | Freq: Three times a day (TID) | ORAL | 0 refills | Status: DC | PRN
Start: 2021-05-19 — End: 2023-08-19

## 2021-05-19 MED ORDER — SULFASALAZINE 500 MG PO TBEC: 1000.0000 mg | DELAYED_RELEASE_TABLET | Freq: Two times a day (BID) | ORAL | 1 refills | Status: DC

## 2021-05-19 NOTE — Progress Notes (Signed)
Reviewed.  Endya Austin L. Simran Bomkamp, MD, MPH  

## 2021-05-19 NOTE — Telephone Encounter (Signed)
[name] 61-year-old male is requesting preoperative cardiac evaluation for colonoscopy.  He was last seen in the clinic on 03/03/2021.  He continued to be physically active going to the gym and was stable from a cardiac standpoint. ? ?His PMH includes coronary artery disease with LAD stent in 2008.  His cardiac catheterization in September 2021 showed critical stenosis of his mid LAD and patent overlapping old BMS.  He was successfully treated with PCI and DES.  He was noted to have diffuse distal vessel coronary artery disease and has LAD and PDA.  Mild nonocclusive left mainstem and left circumflex stenosis were also noted.  His PMH also includes hyperlipidemia, PACs, PVCs, SVT. ? ? ? ?May his Plavix be held prior to his procedure? ? ?Thank you for your help.  Please direct your response to CV DIV preop pool. ? ?Jossie Ng. Karl Knarr NP-C ? ?  ?05/19/2021, 3:56 PM ?[place] ?[address] ?Office [phone] Fax [phone] ? ?

## 2021-05-19 NOTE — Telephone Encounter (Signed)
 [facility] Pre-operative Risk Assessment  ?   ?Request for surgical clearance:     Endoscopy Procedure ? ?What type of surgery is being performed?     Colonoscopy ? ?When is this surgery scheduled?     07/13/2021 ? ?What type of clearance is required ?   Pharmacy ? ?Are there any medications that need to be held prior to surgery and how long? Plavix - 5 days? ? ?Practice name and name of physician performing surgery?      [facility] ? ?What is your office phone and fax number?      Phone- [phone]  Fax- [phone] ? ?Anesthesia type (None, local, MAC, general) ?       [facility] ? ?

## 2021-05-19 NOTE — Patient Instructions (Addendum)
If you are age 61 or older, your body mass index should be between 23-30. Your Body mass index is 28.02 kg/m?Marland Kitchen If this is out of the aforementioned range listed, please consider follow up with your Primary Care Provider. ? ?If you are age 75 or younger, your body mass index should be between 19-25. Your Body mass index is 28.02 kg/m?Marland Kitchen If this is out of the aformentioned range listed, please consider follow up with your Primary Care Provider.  ? ?________________________________________________________ ? ?The [place] [facility] providers would like to encourage you to use [facility] to communicate with providers for non-urgent requests or questions.  Due to long hold times on the telephone, sending your provider a message by [facility] may be a faster and more efficient way to get a response.  Please allow 48 business hours for a response.  Please remember that this is for non-urgent requests.  ?_______________________________________________________ ? ?Your provider has requested that you go to the basement level for lab work before leaving today. Press "B" on the elevator. The lab is located at the first door on the left as you exit the elevator. ? ?We have sent the following medications to your pharmacy for you to pick up at your convenience:  Sulfasalazine, Dicyclomine ? ?Your provider has requested that you go to the basement level for an x ray before leaving today. Press "B" on the elevator. The lab is located at the first door on the left as you exit the elevator. ? ? ? ?Suggest getting DEXA with primary care.  ? ?Health Maintenance in Inflammatory Bowel Disease ? ?Vaccines ?Inflammatory Bowel Disease (IBD) is often treated with immunomodulatory medications (6-mercaptopurine, azathioprine, methotrexate), biologic therapies (adalimumab, certulizomab, natalizumab, infliximab, vedolizumab) or chronic steroids (at least 10mg  daily for 3 months), which suppress the immune system. Patients receiving this type of therapy are at  higher risk to develop infections. Many infections can be prevented by vaccinations. Patients with IBD should follow the immunization schedule for the general adult population with one exception - patients on immunosuppressive therapy should NOT receive live vaccines.  ? ?Discuss vaccinations with your physician, but in general, IBD patients SHOULD receive the following vaccines: ? ?Hepatitis A ? If not previously vaccinated, this is a 2-dose vaccine.  ? ?Hepatitis B ? If not previously vaccinated, this is a 3-dose vaccine.  ? ?Human papilloma virus (HPV) ? The HPV vaccine is recommended in females aged 9-26. It is a 3-dose vaccine.  ? The HPV vaccine is also now recommended in males aged 13-21. In addition, males that are immunocompromised may get the vaccine up until age 27. It is a 3-dose vaccine.  ? ?Influenza ? All adults should get the influenza vaccine annually. ? IBD patients on immunosuppressive therapy should NOT get the intranasal vaccine, as it is a live vaccine. ? ?Pneumococcal ? All adults 65 years or older should receive the Pneumococcal Conjugate Vaccine (PCV13), followed by the Pneumococcal Polysaccharide Vaccine (PPSV23) at least 1 year later. ? In addition, patients over the age of 35 on immunosuppressive therapy should receive the PCV13, followed by PPSV23 no sooner than 8 weeks later. A booster PPSV23 is also recommended 5 years later. ? Finally, patients that smoke should get the PPSV23 if not otherwise vaccinated. ? ?Tetanus, diphtheria, pertussis (Tdap/Td) ? All adults should receive a 1-time Tdap. ? A Td booster should be administered every 10 years.  ? ?Bone Health ?Patients with IBD can have markedly lower bone mineral densities than patients without IBD. Low bone  mineral density is associated with fractures. Therefore, screening for bone disease, such as osteoporosis and osteopenia, is important in certain populations. ? The American Gastroenterology Association ([place]) and the [employer] of Gastroenterology [facility]) recommend dual-energy x-ray absorptiometry scanning (DEXA) in postmenopausal women, men over the age of 58, patients with prolonged corticosteroid use (greater than 3 consecutive months or recurrent courses), patients with a personal history of low trauma fracture and patients with hypogonadism.  ? ?Tobacco Cessation ?Smoking worsens Crohn?s disease. In addition, smoking is associated with many known cardiac, pulmonary and oncologic risks.  ? ?If you need help with quitting smoking, please talk to your doctor and call 1-800-QUIT NOW. ? ?Cancer Screening ?IBD patients on immunosuppressive therapy may be more susceptible to certain types of cancer. However, specific evidence is conflicting. Therefore, patients with IBD should undergo age- and sex-specific cancer screening. Please discuss this important issue with your primary care provider. ? ?Colon Cancer ? Patients with either Ulcerative Colitis (UC) or Crohn?s disease involving the colon should have colon cancer screening 8-10 years after initial diagnosis. After that time, the frequency of surveillance colonocoscopy will be determined by your gastroenterologist, but will usually occur every 1-2 years.  ? ?References: ?Itzkowitz SH, Present DH. Consensus Conference: Colorectal Cancer Screening and Surveillance in Inflammatory Bowel Disease. Inflamm Bowel Dis 2005;11:314-321.  ? ?Moscandrew M, Mahadevan U, Kane S. General Health Maintenance in IBD. Inflamm Bowel Dis 2009;15 [id number]. ? ?

## 2021-05-21 NOTE — Telephone Encounter (Signed)
Ok to hold Plavix 5 days prior to colonoscopy ?

## 2021-05-22 ENCOUNTER — Other Ambulatory Visit: Payer: Self-pay | Admitting: Orthopedic Surgery

## 2021-05-22 DIAGNOSIS — Z7952 Long term (current) use of systemic steroids: Secondary | ICD-10-CM

## 2021-05-22 NOTE — Telephone Encounter (Signed)
? ?  Primary Cardiologist: Larae Grooms, MD ? ?Chart reviewed as part of pre-operative protocol coverage. Given past medical history and time since last visit, based on ACC/AHA guidelines, [name] would be at acceptable risk for the planned procedure without further cardiovascular testing.  ? ?His Plavix may be held for 5 days prior to his colonoscopy.  Please resume when hemostasis is achieved. ? ?I will route this recommendation to the requesting party via Epic fax function and remove from pre-op pool. ? ?Please call with questions. ? ?Jossie Ng. Willie Loy NP-C ? ?  ?05/22/2021, 10:12 AM ?[place] [place] ?[address] ?Office [phone] Fax [phone] ? ? ? ? ?

## 2021-05-23 ENCOUNTER — Telehealth: Payer: Self-pay

## 2021-05-23 NOTE — Telephone Encounter (Signed)
Spoke with patient and let him know that per Dr. Irish Lack he could hold his Plavix for 5 days prior to his procedure. ? ?Patient agreed ?

## 2021-05-24 LAB — QUANTIFERON-TB GOLD PLUS
Mitogen-NIL: 2.27 IU/mL
NIL: 0.03 IU/mL
QuantiFERON-TB Gold Plus: NEGATIVE
TB1-NIL: <0 IU/mL
TB2-NIL: 0 IU/mL

## 2021-05-24 LAB — HEPATITIS B SURFACE ANTIGEN: Hepatitis B Surface Ag: NONREACTIVE

## 2021-06-30 ENCOUNTER — Other Ambulatory Visit: Payer: Self-pay | Admitting: Orthopedic Surgery

## 2021-06-30 DIAGNOSIS — E1169 Type 2 diabetes mellitus with other specified complication: Secondary | ICD-10-CM

## 2021-07-05 ENCOUNTER — Encounter: Payer: Self-pay | Admitting: Certified Registered Nurse Anesthetist

## 2021-07-12 ENCOUNTER — Encounter: Payer: Self-pay | Admitting: Certified Registered Nurse Anesthetist

## 2021-07-13 ENCOUNTER — Telehealth: Payer: Self-pay

## 2021-07-13 ENCOUNTER — Encounter: Payer: Self-pay | Admitting: Gastroenterology

## 2021-07-13 ENCOUNTER — Ambulatory Visit (AMBULATORY_SURGERY_CENTER): Payer: PRIVATE HEALTH INSURANCE | Admitting: Gastroenterology

## 2021-07-13 VITALS — BP 101/67 | HR 78 | Temp 97.3°F | Resp 16 | Ht 68.0 in | Wt 184.0 lb

## 2021-07-13 DIAGNOSIS — K635 Polyp of colon: Secondary | ICD-10-CM | POA: Diagnosis not present

## 2021-07-13 DIAGNOSIS — K529 Noninfective gastroenteritis and colitis, unspecified: Secondary | ICD-10-CM | POA: Diagnosis not present

## 2021-07-13 DIAGNOSIS — K5730 Diverticulosis of large intestine without perforation or abscess without bleeding: Secondary | ICD-10-CM | POA: Diagnosis not present

## 2021-07-13 DIAGNOSIS — K5180 Other ulcerative colitis without complications: Secondary | ICD-10-CM | POA: Diagnosis not present

## 2021-07-13 DIAGNOSIS — D122 Benign neoplasm of ascending colon: Secondary | ICD-10-CM

## 2021-07-13 DIAGNOSIS — K5150 Left sided colitis without complications: Secondary | ICD-10-CM

## 2021-07-13 DIAGNOSIS — K219 Gastro-esophageal reflux disease without esophagitis: Secondary | ICD-10-CM

## 2021-07-13 DIAGNOSIS — K648 Other hemorrhoids: Secondary | ICD-10-CM

## 2021-07-13 MED ORDER — SODIUM CHLORIDE 0.9 % IV SOLN: 500.0000 mL | INTRAVENOUS | Status: DC

## 2021-07-13 NOTE — Progress Notes (Signed)
Pt's states no medical or surgical changes since previsit or office visit. 

## 2021-07-13 NOTE — Op Note (Addendum)
[place] ?Patient Name: [name] ?Procedure Date: 07/13/2021 1:42 PM ?MRN: [id number] ?Endoscopist: Thornton Park MD, MD ?Age: 61 ?Referring MD:  ?Date of Birth: 1960-06-14 ?Gender: Male ?Account #: [id number] ?Procedure:                Colonoscopy ?Indications:              Follow-up of chronic ulcerative pancolitis -  ?                          diagnosed in the 1990s, last colonoscopy 4-5 years,  ?                          awaiting records from [place] documenting extent  ?                          of disease ?Medicines:                Monitored Anesthesia Care ?Procedure:                Pre-Anesthesia Assessment: ?                          - Prior to the procedure, a History and Physical  ?                          was performed, and patient medications and  ?                          allergies were reviewed. The patient's tolerance of  ?                          previous anesthesia was also reviewed. The risks  ?                          and benefits of the procedure and the sedation  ?                          options and risks were discussed with the patient.  ?                          All questions were answered, and informed consent  ?                          was obtained. Prior Anticoagulants: The patient has  ?                          taken Plavix (clopidogrel), last dose was 5 days  ?                          prior to procedure. ASA Grade Assessment: III - A  ?                          patient with severe systemic disease. After  ?  reviewing the risks and benefits, the patient was  ?                          deemed in satisfactory condition to undergo the  ?                          procedure. ?                          After obtaining informed consent, the colonoscope  ?                          was passed under direct vision. Throughout the  ?                          procedure, the patient's blood pressure, pulse, and  ?                          oxygen  saturations were monitored continuously. The  ?                          Olympus CF-HQ190L Colonscope was introduced through  ?                          the anus and advanced to the the cecum, identified  ?                          by appendiceal orifice and ileocecal valve. A  ?                          second forward view of the right colon was  ?                          performed. The colonoscopy was performed without  ?                          difficulty. The patient tolerated the procedure  ?                          well. The quality of the bowel preparation was  ?                          good. The terminal ileum, ileocecal valve,  ?                          appendiceal orifice, and rectum were photographed. ?Scope In: 2:00:48 PM ?Scope Out: 2:22:41 PM ?Scope Withdrawal Time: 0 hours 20 minutes 11 seconds  ?Total Procedure Duration: 0 hours 21 minutes 53 seconds  ?Findings:                 The perianal and digital rectal examinations were  ?                          normal. ?  Non-bleeding internal hemorrhoids were found. ?                          A few diverticula were found in the sigmoid colon  ?                          and descending colon. ?                          A 344 mm polyp was found in the ascending colon.  ?                          The polyp was sessile. The polyp and surrounding  ?                          mucosa were removed with a cold snare. Resection  ?                          and retrieval were complete. Estimated blood loss  ?                          was minimal. ?                          Inflammation was found in a continuous and  ?                          circumferential pattern from the rectum to the  ?                          cecum. This was graded as Mayo Score 1 (mild, with  ?                          erythema, decreased vascular pattern, mild  ?                          friability). It was most pronounced in the right  ?                           colon and cecum. Biopsies were taken every 10 cm  ?                          with a cold forceps for histology. Estimated blood  ?                          loss was minimal. ?                          The exam was otherwise without abnormality on  ?                          direct and retroflexion views. ?Complications:            No immediate complications. ?Estimated Blood Loss:     Estimated blood  loss was minimal. ?Impression:               - Non-bleeding internal hemorrhoids. ?                          - Diverticulosis in the sigmoid colon and in the  ?                          descending colon. ?                          - One 3-4 mm polyp in the ascending colon, removed  ?                          with a cold snare. Resected and retrieved. ?                          - Mild (Mayo Score 1) ulcerative colitis. Biopsied. ?                          - The examination was otherwise normal on direct  ?                          and retroflexion views. ?Recommendation:           - Patient has a contact number available for  ?                          emergencies. The signs and symptoms of potential  ?                          delayed complications were discussed with the  ?                          patient. Return to normal activities tomorrow.  ?                          Written discharge instructions were provided to the  ?                          patient. ?                          - Resume previous diet. ?                          - Continue present medications. ?                          - Resume Plavix 07/15/21. ?                          - Await pathology results. ?                          - Repeat colonoscopy date to be determined after  ?  pending pathology results are reviewed for  ?                          surveillance. ?                          - Emerging evidence supports eating a diet of  ?                          fruits, vegetables, grains, calcium, and yogurt  ?                           while reducing red meat and alcohol may reduce the  ?                          risk of colon cancer. ?Thornton Park MD, MD ?07/13/2021 2:31:00 PM ?This report has been signed electronically. ?

## 2021-07-13 NOTE — Progress Notes (Signed)
? ?Referring Provider: Yvonna Alanis, NP ?Primary Care Physician:  Yvonna Alanis, NP ? ?Indication for Procedure: Ulcerative colitis ? ? ?IMPRESSION:  ?Ulcerative colitis x 15-20 years ?Appropriate candidate for monitored anesthesia care ? ?PLAN: ?Colonoscopy in the [place] today ? ? ?HPI: [name] is a 61 y.o. male presents for colonoscopy.  Recently seen by Vicie Mutters in the office.  He is looking to establish care for a prior diagnosis of ulcerative colitis.  Please see her notes for complete details.  ? ?So for 15-20 years in the morning he will be in the morning for at least 2 hours. 3-4 BM's in the morning the most.  ? ?Stools are formed, hard stools with occ straining.  ?When he finishes with BM in the morning, will have urge to have BM. ?Can have some AB pain with BM, no rectal pain.  ?Very rare BRB blood in stool, small volume in toliet.  ? ?Last colonoscopy was 4-5 year ago.  He felt he was due for colonoscopy at this time. ? ?Stool studies recommended at the time of his last office visit but not submitted.  CRP 1.5, sed rate 23, hep B surface antigen negative, Quant or Ferron TB Gold negative.  Labs in February showing normal CBC and a normal CMP except for a calcium of 10.5 and a glucose of 202 ? ? ?Past Medical History:  ?Diagnosis Date  ? Anemia   ? Ankylosing spondylitis ([place])   ? Arthritis   ? Asthma   ? SINCE AGE 38  ? Colitis, ulcerative ([place])   ? Coronary artery disease   ? Bare metal LAD stent 10/08  ? Coronary atherosclerosis of native coronary artery 12/25/2012  ? ED (erectile dysfunction)   ? Elevated prostate specific antigen (PSA) 12/25/2012  ? Esophageal reflux 12/25/2012  ? Extrinsic asthma, unspecified 12/25/2012  ? Fibromyalgia   ? GERD (gastroesophageal reflux disease)   ? History of heart attack   ? Hypercalcemia 12/25/2012  ? Hyperlipidemia   ? Left sided ulcerative (chronic) colitis ([place]) 12/25/2012  ? Mixed hyperlipidemia 12/25/2012  ? Pneumonia   ? as a child  ? Pure  hypercholesterolemia 12/25/2012  ? Skin cancer of eyelid   ? TMJ (dislocation of temporomandibular joint)   ? ? ?Past Surgical History:  ?Procedure Laterality Date  ? COLONOSCOPY    ? CORONARY/GRAFT ACUTE MI REVASCULARIZATION N/A 11/23/2019  ? Procedure: Coronary/Graft Acute MI Revascularization;  Surgeon: Sherren Mocha, MD;  Location: [facility];  Service: Cardiovascular;  Laterality: N/A;  ? EYE SURGERY Bilateral   ? heart stent    ? LEFT HEART CATH AND CORONARY ANGIOGRAPHY N/A 11/23/2019  ? Procedure: LEFT HEART CATH AND CORONARY ANGIOGRAPHY;  Surgeon: Sherren Mocha, MD;  Location: [facility];  Service: Cardiovascular;  Laterality: N/A;  ? skin cancer removed from left eyelid     ? skull fracture after falling down stairs    ? TONSILLECTOMY    ? ? ?Current Outpatient Medications  ?Medication Sig Dispense Refill  ? albuterol (PROVENTIL HFA;VENTOLIN HFA) 108 (90 BASE) MCG/ACT inhaler Inhale 2 puffs into the lungs every 6 (six) hours as needed for wheezing.    ? aspirin 81 MG tablet Take 81 mg by mouth daily.     ? clopidogrel (PLAVIX) 75 MG tablet Take 1 tablet (75 mg total) by mouth daily. 90 tablet 3  ? cyclobenzaprine (FLEXERIL) 5 MG tablet Take 5 mg by mouth at bedtime.     ?  dicyclomine (BENTYL) 20 MG tablet Take 1 tablet (20 mg total) by mouth 3 (three) times daily as needed for spasms. 40 tablet 0  ? Evolocumab (REPATHA SURECLICK) 191 MG/ML SOAJ Inject 1 pen into the skin every 14 (fourteen) days. 2 mL 11  ? Ferrous Gluconate (IRON 27 PO) Take 1 tablet by mouth daily.    ? ferrous sulfate 325 (65 FE) MG EC tablet Take 325 mg by mouth daily with breakfast.     ? gabapentin (NEURONTIN) 100 MG capsule Take 200 mg by mouth at bedtime.    ? HYDROcodone-acetaminophen (NORCO) 10-325 MG per tablet Take 0.5-1 tablets by mouth every 6 (six) hours as needed for severe pain.   0  ? icosapent Ethyl (VASCEPA) 1 g capsule TAKE 2 CAPSULES BY MOUTH TWICE A DAY 120 capsule 8  ? loratadine (CLARITIN) 10 MG  tablet Take 10 mg by mouth daily.    ? metFORMIN (GLUCOPHAGE-XR) 500 MG 24 hr tablet TAKE 1 TABLET (500 MG TOTAL) BY MOUTH DAILY. 90 tablet 1  ? metoprolol succinate (TOPROL-XL) 50 MG 24 hr tablet TAKE 1 TABLET BY MOUTH EVERY DAY 90 tablet 3  ? Multiple Vitamins-Minerals (CENTRUM CARDIO) TABS Take 1 tablet by mouth 2 (two) times daily.     ? nitroGLYCERIN (NITROSTAT) 0.4 MG SL tablet Place 1 tablet (0.4 mg total) under the tongue every 5 (five) minutes as needed for chest pain. 25 tablet 6  ? pantoprazole (PROTONIX) 40 MG tablet TAKE 1 TABLET (40 MG TOTAL) BY MOUTH DAILY. PLEASE SCHEDULE AN APPT. 90 tablet 2  ? predniSONE (DELTASONE) 1 MG tablet Take 3 mg by mouth daily.    ? sulfaSALAzine (AZULFIDINE) 500 MG EC tablet Take 2 tablets (1,000 mg total) by mouth 2 (two) times daily. 180 tablet 1  ? theophylline (THEODUR) 300 MG 12 hr tablet Take 300 mg by mouth 2 (two) times daily.    ? ?No current facility-administered medications for this visit.  ? ? ?Allergies as of 07/13/2021  ? (No Known Allergies)  ? ? ?Family History  ?Problem Relation Age of Onset  ? Hypertension Mother   ? Lung cancer Father   ? Lung cancer Sister   ? GER disease Sister   ? Brain cancer Sister   ? Diabetes Maternal Grandmother   ? Emphysema Maternal Grandfather   ? COPD Maternal Grandfather   ? Pancreatic cancer Neg Hx   ? Stomach cancer Neg Hx   ? Colon cancer Neg Hx   ? Esophageal cancer Neg Hx   ? Colon polyps Neg Hx   ? Heart disease Neg Hx   ? ? ? ?Physical Exam: ?General:   Alert,  well-nourished, pleasant and cooperative in NAD ?Head:  Normocephalic and atraumatic. ?Eyes:  Sclera clear, no icterus.   Conjunctiva pink. ?Mouth:  No deformity or lesions.   ?Neck:  Supple; no masses or thyromegaly. ?Lungs:  Clear throughout to auscultation.   No wheezes. ?Heart:  Regular rate and rhythm; no murmurs. ?Abdomen:  Soft, non-tender, nondistended, normal bowel sounds, no rebound or guarding.  ?Msk:  Symmetrical. No boney deformities ?LAD: No  inguinal or umbilical LAD ?Extremities:  No clubbing or edema. ?Neurologic:  Alert and  oriented x4;  grossly nonfocal ?Skin:  No obvious rash or bruise. ?Psych:  Alert and cooperative. Normal mood and affect. ? ? ? ? ?Studies/Results: ?No results found. ? ? ? ?Aiden Rao L. Tarri Glenn, MD, MPH ?07/13/2021, 1:10 PM ? ? ? ?  ?

## 2021-07-13 NOTE — Patient Instructions (Signed)
Handouts on hemorrhoids, ulcerative colitis, polyps, and diverticulosis.  ?Await pathology results. ?Resume previous diet and continue present medications.  ?Repeat colonoscopy for surveillance will be based on pathology results. ? ?YOU HAD AN ENDOSCOPIC PROCEDURE TODAY AT [facility]:   Refer to the procedure report that was given to you for any specific questions about what was found during the examination.  If the procedure report does not answer your questions, please call your gastroenterologist to clarify.  If you requested that your care partner not be given the details of your procedure findings, then the procedure report has been included in a sealed envelope for you to review at your convenience later. ? ?YOU SHOULD EXPECT: Some feelings of bloating in the abdomen. Passage of more gas than usual.  Walking can help get rid of the air that was put into your GI tract during the procedure and reduce the bloating. If you had a lower endoscopy (such as a colonoscopy or flexible sigmoidoscopy) you may notice spotting of blood in your stool or on the toilet paper. If you underwent a bowel prep for your procedure, you may not have a normal bowel movement for a few days. ? ?Please Note:  You might notice some irritation and congestion in your nose or some drainage.  This is from the oxygen used during your procedure.  There is no need for concern and it should clear up in a day or so. ? ?SYMPTOMS TO REPORT IMMEDIATELY: ? ?Following lower endoscopy (colonoscopy or flexible sigmoidoscopy): ? Excessive amounts of blood in the stool ? Significant tenderness or worsening of abdominal pains ? Swelling of the abdomen that is new, acute ? Fever of 100?F or higher ? ? ?For urgent or emergent issues, a gastroenterologist can be reached at any hour by calling [phone]. ?Do not use MyChart messaging for urgent concerns.  ? ? ?DIET:  We do recommend a small meal at first, but then you may proceed to your  regular diet.  Drink plenty of fluids but you should avoid alcoholic beverages for 24 hours. ? ?ACTIVITY:  You should plan to take it easy for the rest of today and you should NOT DRIVE or use heavy machinery until tomorrow (because of the sedation medicines used during the test).   ? ?FOLLOW UP: ?Our staff will call the number listed on your records 48-72 hours following your procedure to check on you and address any questions or concerns that you may have regarding the information given to you following your procedure. If we do not reach you, we will leave a message.  We will attempt to reach you two times.  During this call, we will ask if you have developed any symptoms of COVID 19. If you develop any symptoms (ie: fever, flu-like symptoms, shortness of breath, cough etc.) before then, please call [phone].  If you test positive for Covid 19 in the 2 weeks post procedure, please call and report this information to [place].   ? ?If any biopsies were taken you will be contacted by phone or by letter within the next 1-3 weeks.  Please call us at [phone] if you have not heard about the biopsies in 3 weeks.  ? ? ?SIGNATURES/CONFIDENTIALITY: ?You and/or your care partner have signed paperwork which will be entered into your electronic medical record.  These signatures attest to the fact that that the information above on your After Visit Summary has been reviewed and is understood.  Full responsibility of  the confidentiality of this discharge information lies with you and/or your care-partner.  ?

## 2021-07-13 NOTE — Telephone Encounter (Signed)
-----   Message from Thornton Park, MD sent at 07/13/2021  2:27 PM EDT ----- ?Needs office follow-up with me, Amy, or Colleen - next available. ? ?TY. ? ?KLB ? ?

## 2021-07-13 NOTE — Progress Notes (Signed)
Report given to PACU, vss 

## 2021-07-13 NOTE — Telephone Encounter (Signed)
Per Dr. Tarri Glenn request, pt has been scheduled for f/u appt with Carl Best, CRNP on 07/21/21 @ 130pm. Appt reminder has been mailed. Appt will reflect on AVS upon hosp discharge for pt future reference. ? ?

## 2021-07-13 NOTE — Progress Notes (Signed)
Called to room to assist during endoscopic procedure.  Patient ID and intended procedure confirmed with present staff. Received instructions for my participation in the procedure from the performing physician.  

## 2021-07-17 ENCOUNTER — Telehealth: Payer: Self-pay | Admitting: *Deleted

## 2021-07-17 ENCOUNTER — Telehealth: Payer: Self-pay

## 2021-07-17 NOTE — Telephone Encounter (Signed)
?  Follow up Call- ? ? ?  07/13/2021  ?  1:15 PM  ?Call back number  ?Post procedure Call Back phone  # [phone]  ?Permission to leave phone message Yes  ?  ? ?Patient questions: ? ?Do you have a fever, pain , or abdominal swelling? No. ?Pain Score  0 * ? ?Have you tolerated food without any problems? Yes.   ? ?Have you been able to return to your normal activities? Yes.   ? ?Do you have any questions about your discharge instructions: ?Diet   No. ?Medications  No. ?Follow up visit  No. ? ?Do you have questions or concerns about your Care? No. ? ?Actions: ?* If pain score is 4 or above: ?No action needed, pain <4. ? ? ?

## 2021-07-17 NOTE — Telephone Encounter (Signed)
Left message on follow up call. 

## 2021-07-21 ENCOUNTER — Encounter: Payer: Self-pay | Admitting: Nurse Practitioner

## 2021-07-21 ENCOUNTER — Ambulatory Visit: Payer: PRIVATE HEALTH INSURANCE | Admitting: Nurse Practitioner

## 2021-07-21 VITALS — BP 106/66 | HR 100 | Ht 69.0 in | Wt 180.0 lb

## 2021-07-21 DIAGNOSIS — K5190 Ulcerative colitis, unspecified, without complications: Secondary | ICD-10-CM

## 2021-07-21 NOTE — Patient Instructions (Signed)
Our office will contact you with Dr Tarri Glenn treatment recommendations in the near future. ? ?If you are age 61 or older, your body mass index should be between 23-30. Your Body mass index is 26.58 kg/m?Marland Kitchen If this is out of the aforementioned range listed, please consider follow up with your Primary Care Provider. ? ?If you are age 70 or younger, your body mass index should be between 19-25. Your Body mass index is 26.58 kg/m?Marland Kitchen If this is out of the aformentioned range listed, please consider follow up with your Primary Care Provider.  ? ?________________________________________________________ ? ?The [facility] providers would like to encourage you to use [facility] to communicate with providers for non-urgent requests or questions.  Due to long hold times on the telephone, sending your provider a message by [facility] may be a faster and more efficient way to get a response.  Please allow 48 business hours for a response.  Please remember that this is for non-urgent requests.  ?_______________________________________________________ ? ?Due to recent changes in healthcare laws, you may see the results of your imaging and laboratory studies on MyChart before your provider has had a chance to review them.  We understand that in some cases there may be results that are confusing or concerning to you. Not all laboratory results come back in the same time frame and the provider may be waiting for multiple results in order to interpret others.  Please give [place] 48 hours in order for your provider to thoroughly review all the results before contacting the office for clarification of your results.  ? ?

## 2021-07-21 NOTE — Progress Notes (Signed)
? ? ? ?07/21/2021 ?[name] ?[id number] ?1960-12-21 ? ? ?Chief Complaint: Follow up after colonoscopy  ? ?History of Present Illness: [name] is a 61 year old male with a past medical history of coronary artery disease s/p stent to the LAD 2008 s/p STEMI s/p DES x two 11/2019 on ASA and Plavix,  DM II, GERD, ankylosing spondylitis and ulcerative colitis.  He was previously followed by Dr. Alessandra Bevels at [place] with [facility] gastroenterology, due to change in insurance he transferred his care to [facility] gastroenterology.  He was initially seen by Vicie Mutters, PA-C on 05/19/2021 and a surveillance colonoscopy was ordered. He underwent a colonoscopy with Dr. Tarri Glenn on 07/13/2021 which showed mild ulcerative colitis Mayo score 1, diverticulosis to the sigmoid and descending colon and a 3-4 hyperplastic polyp in the ascending colon. Ascending colon with chronic colitis moderately active phase, further colon biopsies showed chronic ulcerative colitis throughout the colon at 69 - 40cm, nonspecific inflammation at 30 - 20 cm without evidence of active IBD then chronic ulcerative colitis at 10cm and no evidence of IBD to the rectum.  ? ?He presents today for follow up post colonoscopy. He remains on Prednisone '3mg'$  QD, on Predinsne since 1993 or 1994. He remains on Sulfasalazine 1000 mg po bid . He tried to wean off Prednisone x 2 in the past which was unsuccessful due to worsening arthritic pain. He passed a normal formed solid BM for about 3 to 4 days post colonoscopy then back to passing 4 small solid stools in the morning x 4 over a 2 hour period. No abdominal pain. Appetite is good and weight is stable.  ? ?He was on Humira in the past for ankylosing spondylitis then switched to Remicade in 2017 for about 3 years for ankylosing spondylitis per rheumatology.  ? ?He remains on Pantoprazole '40mg'$  QD. No GERD symptoms.  ? ? ?  Latest Ref Rng & Units 04/27/2021  ? 12:02 PM 08/07/2020  ? 11:15 AM 11/24/2019  ?  2:17 AM   ?CBC  ?WBC 3.8 - 10.8 Thousand/uL 7.0   8.3   9.4    ?Hemoglobin 13.2 - 17.1 g/dL 15.0   13.8   13.3    ?Hematocrit 38.5 - 50.0 % 44.9   42.8   40.1    ?Platelets 140 - 400 Thousand/uL 289   299   245    ?  ? ?  Latest Ref Rng & Units 04/27/2021  ? 12:02 PM 03/03/2021  ? 11:22 AM 08/07/2020  ? 11:15 AM  ?CMP  ?Glucose 65 - 99 mg/dL 202   169   273    ?BUN 7 - 25 mg/dL '17   14   19    '$ ?Creatinine 0.70 - 1.35 mg/dL 0.88   0.78   0.84    ?Sodium 135 - 146 mmol/L 139   140   136    ?Potassium 3.5 - 5.3 mmol/L 4.2   4.1   3.9    ?Chloride 98 - 110 mmol/L 103   103   104    ?CO2 20 - 32 mmol/L '28   21   21    '$ ?Calcium 8.6 - 10.3 mg/dL 10.5   9.8   9.7    ?Total Protein 6.1 - 8.1 g/dL 7.4   7.3     ?Total Bilirubin 0.2 - 1.2 mg/dL 0.6   0.4     ?Alkaline Phos 44 - 121 IU/L  87     ?  AST 10 - 35 U/L 15   18     ?ALT 9 - 46 U/L 19   19     ?  ?Colonoscopy 07/13/2021 by Dr. Tarri Glenn: ?- Non-bleeding internal hemorrhoids. ?- Diverticulosis in the sigmoid colon and in the descending colon. ?- One 3-4 mm polyp in the ascending colon, removed with a cold snare. Resected and ?retrieved. ?- Mild (Mayo Score 1) ulcerative colitis. Biopsied. ?- The examination was otherwise normal on direct and retroflexion views. ?1. Surgical [P], colon, 90 cm ?CHRONIC COLITIS WITH MORPHOLOGIC FEATURES CONSISTENT WITH CHRONIC ULCERATIVE COLITIS IN ?MODERATELY ACTIVE PHASE. ?NEGATIVE FOR DYSPLASIA ?2. Surgical [P], colon, 80 cm ?CHRONIC COLITIS WITH MORPHOLOGIC FEATURES CONSISTENT WITH CHRONIC ULCERATIVE COLITIS IN ?MODERATELY ACTIVE PHASE. ?NEGATIVE FOR DYSPLASIA. ?3. Surgical [P], colon, ascending ?HYPERPLASTIC POLYP WITH UNDERLYING HYPERPLASTIC LYMPHOID NODULES. ?CHRONIC COLITIS WITH MORPHOLOGIC FEATURES CONSISTENT WITH CHRONIC ULCERATIVE COLITIS IN ?MODERATELY ACTIVE PHASE WITH A FEW HYPERTROPHIC LYMPHOID NODULES IN THE LAMINA PROPRIA. ?NEGATIVE FOR ADENOMATOUS CHANGE, DYSPLASIA AND MALIGNANCY. ?4. Surgical [P], colon, 70 cm ?CHRONIC COLITIS WITH  MORPHOLOGIC FEATURES CONSISTENT WITH CHRONIC ULCERATIVE COLITIS IN ?MINIMALLY ACTIVE PHASE. ?NEGATIVE FOR DYSPLASIA. ?5. Surgical [P], colon, 60 cm ?CHRONIC COLITIS WITH MORPHOLOGIC FEATURES CONSISTENT WITH CHRONIC ULCERATIVE COLITIS IN ?MODERATELY ACTIVE PHASE. ?NEGATIVE FOR DYSPLASIA. ?6. Surgical [P], colon, 50 cm ?CHRONIC COLITIS WITH MORPHOLOGIC FEATURES CONSISTENT WITH CHRONIC ULCERATIVE COLITIS IN ?MINIMALLY ACTIVE PHASE. ?NEGATIVE FOR DYSPLASIA. ?7. Surgical [P], colon, at 40 cm ?CHRONIC COLITIS WITH MORPHOLOGIC FEATURES CONSISTENT WITH CHRONIC ULCERATIVE COLITIS IN ?MINIMALLY ACTIVE PHASE. ?NEGATIVE FOR DYSPLASIA. ?8. Surgical [P], colon, at 30 cm ?NONSPECIFIC INFLAMMATION IN FRAGMENTS OF COLONIC MUCOSA. ?THERE ARE NO DIAGNOSTIC FEATURES OF INFLAMMATORY BOWEL DISEASE IN ACTIVE PHASE. ?NEGATIVE FOR DYSPLASIA. ?9. Surgical [P], colon, at 20 cm ?MILD NONSPECIFIC INFLAMMATION IN FRAGMENTS OF COLONIC MUCOSA. ?THERE ARE NO DIAGNOSTIC FEATURES OF INFLAMMATORY BOWEL DISEASE IN ACTIVE PHASE. ?NEGATIVE FOR DYSPLASIA. ?10. Surgical [P], colon, at 10 cm ?CHRONIC COLITIS WITH MORPHOLOGIC FEATURES CONSISTENT WITH CHRONIC ULCERATIVE COLITIS IN ?MINIMALLY ACTIVE PHASE. ?NEGATIVE FOR DYSPLASIA. ?11. Surgical [P], colon, rectal ?EDEMATOUS FRAGMENTS OF RECTAL MUCOSA. ?NEGATIVE FOR INFLAMMATORY BOWEL DISEASE, DYSPLASIA AND NEOPLASM. ? ?Current Outpatient Medications on File Prior to Visit  ?Medication Sig Dispense Refill  ? albuterol (PROVENTIL HFA;VENTOLIN HFA) 108 (90 BASE) MCG/ACT inhaler Inhale 2 puffs into the lungs every 6 (six) hours as needed for wheezing.    ? aspirin 81 MG tablet Take 81 mg by mouth daily.     ? clopidogrel (PLAVIX) 75 MG tablet Take 1 tablet (75 mg total) by mouth daily. 90 tablet 3  ? cyclobenzaprine (FLEXERIL) 5 MG tablet Take 5 mg by mouth at bedtime.     ? dicyclomine (BENTYL) 20 MG tablet Take 1 tablet (20 mg total) by mouth 3 (three) times daily as needed for spasms. 40 tablet 0  ? Evolocumab  (REPATHA SURECLICK) 885 MG/ML SOAJ Inject 1 pen into the skin every 14 (fourteen) days. 2 mL 11  ? Ferrous Gluconate (IRON 27 PO) Take 1 tablet by mouth daily.    ? ferrous sulfate 325 (65 FE) MG EC tablet Take 325 mg by mouth daily with breakfast.     ? gabapentin (NEURONTIN) 100 MG capsule Take 200 mg by mouth at bedtime.    ? HYDROcodone-acetaminophen (NORCO) 10-325 MG per tablet Take 0.5-1 tablets by mouth every 6 (six) hours as needed for severe pain.   0  ? icosapent Ethyl (VASCEPA) 1 g capsule TAKE 2 CAPSULES BY MOUTH TWICE A  DAY 120 capsule 8  ? loratadine (CLARITIN) 10 MG tablet Take 10 mg by mouth daily.    ? metFORMIN (GLUCOPHAGE-XR) 500 MG 24 hr tablet TAKE 1 TABLET (500 MG TOTAL) BY MOUTH DAILY. 90 tablet 1  ? metoprolol succinate (TOPROL-XL) 50 MG 24 hr tablet TAKE 1 TABLET BY MOUTH EVERY DAY 90 tablet 3  ? Multiple Vitamins-Minerals (CENTRUM CARDIO) TABS Take 1 tablet by mouth 2 (two) times daily.     ? nitroGLYCERIN (NITROSTAT) 0.4 MG SL tablet Place 1 tablet (0.4 mg total) under the tongue every 5 (five) minutes as needed for chest pain. 25 tablet 6  ? pantoprazole (PROTONIX) 40 MG tablet TAKE 1 TABLET (40 MG TOTAL) BY MOUTH DAILY. PLEASE SCHEDULE AN APPT. 90 tablet 2  ? predniSONE (DELTASONE) 1 MG tablet Take 3 mg by mouth daily.    ? sulfaSALAzine (AZULFIDINE) 500 MG EC tablet Take 2 tablets (1,000 mg total) by mouth 2 (two) times daily. 180 tablet 1  ? theophylline (THEODUR) 300 MG 12 hr tablet Take 300 mg by mouth 2 (two) times daily.    ? ?No current facility-administered medications on file prior to visit.  ? ?No Known Allergies ? ?Current Medications, Allergies, Past Medical History, Past Surgical History, Family History and Social History were reviewed in [employer] record. ? ? ?Review of Systems:   ?Constitutional: Negative for fever, sweats, chills or weight loss.  ?Respiratory: Negative for shortness of breath.   ?Cardiovascular: Negative for chest pain,  palpitations and leg swelling.  ?Gastrointestinal: See HPI.  ?Musculoskeletal: Negative for back pain or muscle aches.  ?Neurological: Negative for dizziness, headaches or paresthesias.  ? ?Physical Exam: ?BP 106/6

## 2021-07-24 NOTE — Progress Notes (Signed)
Reviewed and agree with management plans. Ideally, would prefer alternative long-term management with a biologic over chronic prednisone if he is unable to taper off the prednisone.  ? ?Carren Blakley L. Tarri Glenn, MD, MPH  ?

## 2021-09-13 ENCOUNTER — Telehealth: Payer: Self-pay | Admitting: *Deleted

## 2021-09-13 ENCOUNTER — Encounter: Payer: Self-pay | Admitting: Orthopedic Surgery

## 2021-09-13 NOTE — Telephone Encounter (Signed)
Patient called and scheduled an appointment with Amy for 09/14/2021

## 2021-09-13 NOTE — Telephone Encounter (Signed)
Patient called and left message on Clinical intake voicemail requesting a referral to a ENT due to Hearing loss.   Called and LMOM to return call to schedule an appointment.

## 2021-09-14 ENCOUNTER — Ambulatory Visit: Payer: PRIVATE HEALTH INSURANCE | Admitting: Orthopedic Surgery

## 2021-09-14 ENCOUNTER — Encounter: Payer: Self-pay | Admitting: Orthopedic Surgery

## 2021-09-14 VITALS — BP 110/72 | HR 95 | Temp 77.1°F | Resp 18 | Ht 69.0 in | Wt 184.6 lb

## 2021-09-14 DIAGNOSIS — H9313 Tinnitus, bilateral: Secondary | ICD-10-CM

## 2021-09-14 DIAGNOSIS — H9193 Unspecified hearing loss, bilateral: Secondary | ICD-10-CM | POA: Diagnosis not present

## 2021-09-14 NOTE — Patient Instructions (Addendum)
Referral to see Dr. Benjamine Mola ( ENT) made- call [phone]  Try stopping Claritin and switching to Zyrtec or Xyzal x 14 days to see if it improved tinnitus  Continue hydrocortisone cream for itching bug bite- may also try ice

## 2021-09-14 NOTE — Progress Notes (Signed)
Careteam: Patient Care Team: Harold Alanis, NP as PCP - General (Adult Health Nurse Practitioner) Jettie Booze, MD as PCP - Cardiology (Cardiology)  Seen by: Windell Moulding, AGNP-C  PLACE OF SERVICE:  [place] Directive information Does Patient Have a Medical Advance Directive?: No, Would patient like information on creating a medical advance directive?: No - Patient declined  No Known Allergies  Chief Complaint  Patient presents with   Acute Visit    Patient is here for a referral to ENT for hearing loss     HPI: Patient is a 61 y.o. male sen today for acute visit due to hearing loss.   He has noticed gradual changes in his hearing as far back as 10 years. He went to [facility] and had a hearing test done. He was advised to see a ENT. Requesting referral today. He also reports tinnitus to both ears for around the same time. Denies ear pain or drainage.     Review of Systems:  Review of Systems  Constitutional:  Negative for chills, fever and malaise/fatigue.  HENT:  Positive for hearing loss and tinnitus. Negative for ear discharge and ear pain.   Respiratory:  Negative for cough, shortness of breath and wheezing.   Cardiovascular:  Negative for chest pain and leg swelling.  Psychiatric/Behavioral:  Negative for depression. The patient is not nervous/anxious.     Past Medical History:  Diagnosis Date   Anemia    Ankylosing spondylitis (HCC)    Arthritis    Asthma    SINCE AGE 36   Colitis, ulcerative ([place])    Coronary artery disease    Bare metal LAD stent 10/08   Coronary atherosclerosis of native coronary artery 12/25/2012   ED (erectile dysfunction)    Elevated prostate specific antigen (PSA) 12/25/2012   Esophageal reflux 12/25/2012   Extrinsic asthma, unspecified 12/25/2012   Fibromyalgia    GERD (gastroesophageal reflux disease)    History of heart attack    Hypercalcemia 12/25/2012   Hyperlipidemia    Left sided ulcerative (chronic) colitis  ([place]) 12/25/2012   Mixed hyperlipidemia 12/25/2012   Pneumonia    as a child   Pure hypercholesterolemia 12/25/2012   Skin cancer of eyelid    TMJ (dislocation of temporomandibular joint)    Past Surgical History:  Procedure Laterality Date   COLONOSCOPY     CORONARY/GRAFT ACUTE MI REVASCULARIZATION N/A 11/23/2019   Procedure: Coronary/Graft Acute MI Revascularization;  Surgeon: Sherren Mocha, MD;  Location: [facility];  Service: Cardiovascular;  Laterality: N/A;   EYE SURGERY Bilateral    heart stent     LEFT HEART CATH AND CORONARY ANGIOGRAPHY N/A 11/23/2019   Procedure: LEFT HEART CATH AND CORONARY ANGIOGRAPHY;  Surgeon: Sherren Mocha, MD;  Location: [facility];  Service: Cardiovascular;  Laterality: N/A;   skin cancer removed from left eyelid      skull fracture after falling down stairs     TONSILLECTOMY     Social History:   reports that he has never smoked. He has quit using smokeless tobacco.  His smokeless tobacco use included snuff. He reports current alcohol use. He reports that he does not use drugs.  Family History  Problem Relation Age of Onset   Hypertension Mother    Lung cancer Father    Lung cancer Sister    GER disease Sister    Brain cancer Sister    Diabetes Maternal Grandmother    Emphysema Maternal  Grandfather    COPD Maternal Grandfather    Pancreatic cancer Neg Hx    Stomach cancer Neg Hx    Colon cancer Neg Hx    Esophageal cancer Neg Hx    Colon polyps Neg Hx    Heart disease Neg Hx     Medications: Patient's Medications  New Prescriptions   No medications on file  Previous Medications   ALBUTEROL (PROVENTIL HFA;VENTOLIN HFA) 108 (90 BASE) MCG/ACT INHALER    Inhale 2 puffs into the lungs every 6 (six) hours as needed for wheezing.   ASPIRIN 81 MG TABLET    Take 81 mg by mouth daily.    CLOPIDOGREL (PLAVIX) 75 MG TABLET    Take 1 tablet (75 mg total) by mouth daily.   CYCLOBENZAPRINE (FLEXERIL) 5 MG TABLET    Take 5 mg by  mouth at bedtime.    DICYCLOMINE (BENTYL) 20 MG TABLET    Take 1 tablet (20 mg total) by mouth 3 (three) times daily as needed for spasms.   EVOLOCUMAB (REPATHA SURECLICK) 741 MG/ML SOAJ    Inject 1 pen into the skin every 14 (fourteen) days.   FERROUS GLUCONATE (IRON 27 PO)    Take 1 tablet by mouth daily.   FERROUS SULFATE 325 (65 FE) MG EC TABLET    Take 325 mg by mouth daily with breakfast.    GABAPENTIN (NEURONTIN) 100 MG CAPSULE    Take 200 mg by mouth at bedtime.   HYDROCODONE-ACETAMINOPHEN (NORCO) 10-325 MG PER TABLET    Take 0.5-1 tablets by mouth every 6 (six) hours as needed for severe pain.    ICOSAPENT ETHYL (VASCEPA) 1 G CAPSULE    TAKE 2 CAPSULES BY MOUTH TWICE A DAY   LORATADINE (CLARITIN) 10 MG TABLET    Take 10 mg by mouth daily.   METFORMIN (GLUCOPHAGE-XR) 500 MG 24 HR TABLET    TAKE 1 TABLET (500 MG TOTAL) BY MOUTH DAILY.   METOPROLOL SUCCINATE (TOPROL-XL) 50 MG 24 HR TABLET    TAKE 1 TABLET BY MOUTH EVERY DAY   MULTIPLE VITAMINS-MINERALS (CENTRUM CARDIO) TABS    Take 1 tablet by mouth 2 (two) times daily.    NITROGLYCERIN (NITROSTAT) 0.4 MG SL TABLET    Place 1 tablet (0.4 mg total) under the tongue every 5 (five) minutes as needed for chest pain.   PANTOPRAZOLE (PROTONIX) 40 MG TABLET    TAKE 1 TABLET (40 MG TOTAL) BY MOUTH DAILY. PLEASE SCHEDULE AN APPT.   PREDNISONE (DELTASONE) 1 MG TABLET    Take 3 mg by mouth daily.   SULFASALAZINE (AZULFIDINE) 500 MG EC TABLET    Take 2 tablets (1,000 mg total) by mouth 2 (two) times daily.   THEOPHYLLINE (THEODUR) 300 MG 12 HR TABLET    Take 300 mg by mouth 2 (two) times daily.  Modified Medications   No medications on file  Discontinued Medications   No medications on file    Physical Exam:  Vitals:   09/14/21 1355  BP: 110/72  Pulse: 95  Resp: 18  Temp: (!) 77.1 F (25.1 C)  TempSrc: Temporal  SpO2: 92%  Weight: 184 lb 9.6 oz (83.7 kg)  Height: '5\' 9"'$  (1.753 m)   Body mass index is 27.26 kg/m. Wt Readings from Last  3 Encounters:  09/14/21 184 lb 9.6 oz (83.7 kg)  07/21/21 180 lb (81.6 kg)  07/13/21 184 lb (83.5 kg)    Physical Exam Vitals reviewed.  HENT:     Head:  Normocephalic.     Right Ear: Tympanic membrane and ear canal normal. There is no impacted cerumen.     Left Ear: Tympanic membrane and ear canal normal. There is no impacted cerumen.  Cardiovascular:     Rate and Rhythm: Normal rate and regular rhythm.     Pulses: Normal pulses.     Heart sounds: Normal heart sounds.  Pulmonary:     Effort: Pulmonary effort is normal. No respiratory distress.     Breath sounds: Normal breath sounds. No wheezing.  Neurological:     General: No focal deficit present.     Mental Status: He is alert and oriented to person, place, and time.  Psychiatric:        Mood and Affect: Mood normal.        Behavior: Behavior normal.     Labs reviewed: Basic Metabolic Panel: Recent Labs    03/03/21 1122 04/27/21 1202  NA 140 139  K 4.1 4.2  CL 103 103  CO2 21 28  GLUCOSE 169* 202*  BUN 14 17  CREATININE 0.78 0.88  CALCIUM 9.8 10.5*   Liver Function Tests: Recent Labs    03/03/21 1122 04/27/21 1202  AST 18 15  ALT 19 19  ALKPHOS 87  --   BILITOT 0.4 0.6  PROT 7.3 7.4  ALBUMIN 4.8  --    No results for input(s): "LIPASE", "AMYLASE" in the last 8760 hours. No results for input(s): "AMMONIA" in the last 8760 hours. CBC: Recent Labs    04/27/21 1202  WBC 7.0  NEUTROABS 4,102  HGB 15.0  HCT 44.9  MCV 91.3  PLT 289   Lipid Panel: Recent Labs    03/03/21 1122  CHOL 136  HDL 41  LDLCALC 64  TRIG 184*  CHOLHDL 3.3   TSH: No results for input(s): "TSH" in the last 8760 hours. A1C: Lab Results  Component Value Date   HGBA1C 7.3 (H) 03/03/2021     Assessment/Plan 1. Bilateral hearing loss, unspecified hearing loss type - suspect sensorineural hearing loss - exam unremarkable - Ambulatory referral to ENT  2. Tinnitus of both ears - ongoing - on Claritin - advised to  try different antihistamine  - Ambulatory referral to ENT  Total time: 15 minutes. Greater than 50% of total time spent doing patient education regarding hearing loss and tinnitus including symptom/medication management.     Next appt: Visit date not found  [place] [phone]

## 2021-09-26 ENCOUNTER — Other Ambulatory Visit: Payer: Self-pay | Admitting: Orthopedic Surgery

## 2021-09-26 DIAGNOSIS — H9193 Unspecified hearing loss, bilateral: Secondary | ICD-10-CM

## 2021-09-26 DIAGNOSIS — H9313 Tinnitus, bilateral: Secondary | ICD-10-CM

## 2021-10-11 ENCOUNTER — Other Ambulatory Visit: Payer: Self-pay | Admitting: Physician Assistant

## 2021-10-11 DIAGNOSIS — K5180 Other ulcerative colitis without complications: Secondary | ICD-10-CM

## 2021-10-16 ENCOUNTER — Other Ambulatory Visit: Payer: Self-pay | Admitting: Pharmacist

## 2021-10-16 MED ORDER — REPATHA SURECLICK 140 MG/ML ~~LOC~~ SOAJ: 1.0000 "pen " | SUBCUTANEOUS | 11 refills | Status: DC

## 2021-11-02 ENCOUNTER — Telehealth: Payer: Self-pay | Admitting: Pharmacist

## 2021-11-02 NOTE — Telephone Encounter (Signed)
Repatha prior authorization renewal submitted on 8/14, still awaiting determination from insurance. CMM key [id number].

## 2021-11-06 NOTE — Telephone Encounter (Signed)
Called insurance for update since we have not received any communication in a week. They stated they approved this request back in July? Through 7/27 of next year.

## 2021-11-22 ENCOUNTER — Other Ambulatory Visit: Payer: Self-pay | Admitting: Interventional Cardiology

## 2021-11-24 ENCOUNTER — Other Ambulatory Visit: Payer: Self-pay | Admitting: Interventional Cardiology

## 2021-12-11 ENCOUNTER — Telehealth: Payer: Self-pay | Admitting: Pharmacist

## 2021-12-11 NOTE — Telephone Encounter (Signed)
Patient called and reported someone contacted him to schedule an appt with Dr Irish Lack but he could not get through when he called back. Can someone contact him to schedule?

## 2021-12-30 ENCOUNTER — Other Ambulatory Visit: Payer: Self-pay | Admitting: Physician Assistant

## 2021-12-30 ENCOUNTER — Other Ambulatory Visit: Payer: Self-pay | Admitting: Interventional Cardiology

## 2021-12-30 DIAGNOSIS — K5180 Other ulcerative colitis without complications: Secondary | ICD-10-CM

## 2022-01-06 ENCOUNTER — Other Ambulatory Visit: Payer: Self-pay | Admitting: Orthopedic Surgery

## 2022-01-06 ENCOUNTER — Other Ambulatory Visit: Payer: Self-pay | Admitting: Nurse Practitioner

## 2022-01-06 DIAGNOSIS — E1169 Type 2 diabetes mellitus with other specified complication: Secondary | ICD-10-CM

## 2022-01-06 DIAGNOSIS — K5180 Other ulcerative colitis without complications: Secondary | ICD-10-CM

## 2022-02-19 ENCOUNTER — Other Ambulatory Visit: Payer: Self-pay | Admitting: Interventional Cardiology

## 2022-02-21 ENCOUNTER — Other Ambulatory Visit (INDEPENDENT_AMBULATORY_CARE_PROVIDER_SITE_OTHER): Payer: Self-pay

## 2022-02-21 ENCOUNTER — Ambulatory Visit (INDEPENDENT_AMBULATORY_CARE_PROVIDER_SITE_OTHER): Payer: Self-pay | Admitting: Gastroenterology

## 2022-02-21 ENCOUNTER — Encounter: Payer: Self-pay | Admitting: Gastroenterology

## 2022-02-21 VITALS — BP 120/84 | HR 99 | Ht 68.0 in | Wt 180.0 lb

## 2022-02-21 DIAGNOSIS — K5190 Ulcerative colitis, unspecified, without complications: Secondary | ICD-10-CM

## 2022-02-21 DIAGNOSIS — Z7952 Long term (current) use of systemic steroids: Secondary | ICD-10-CM

## 2022-02-21 DIAGNOSIS — T380X5A Adverse effect of glucocorticoids and synthetic analogues, initial encounter: Secondary | ICD-10-CM

## 2022-02-21 DIAGNOSIS — D84821 Immunodeficiency due to drugs: Secondary | ICD-10-CM

## 2022-02-21 LAB — CBC WITH DIFFERENTIAL/PLATELET
Basophils Absolute: 0.2 10*3/uL — ABNORMAL HIGH (ref 0.0–0.1)
Basophils Relative: 2.4 % (ref 0.0–3.0)
Eosinophils Absolute: 1.4 10*3/uL — ABNORMAL HIGH (ref 0.0–0.7)
Eosinophils Relative: 19 % — ABNORMAL HIGH (ref 0.0–5.0)
HCT: 43 % (ref 39.0–52.0)
Hemoglobin: 14.7 g/dL (ref 13.0–17.0)
Lymphocytes Relative: 28 % (ref 12.0–46.0)
Lymphs Abs: 2 10*3/uL (ref 0.7–4.0)
MCHC: 34.2 g/dL (ref 30.0–36.0)
MCV: 92 fl (ref 78.0–100.0)
Monocytes Absolute: 0.5 10*3/uL (ref 0.1–1.0)
Monocytes Relative: 6.6 % (ref 3.0–12.0)
Neutro Abs: 3.2 10*3/uL (ref 1.4–7.7)
Neutrophils Relative %: 44 % (ref 43.0–77.0)
Platelets: 283 10*3/uL (ref 150.0–400.0)
RBC: 4.67 Mil/uL (ref 4.22–5.81)
RDW: 12.9 % (ref 11.5–15.5)
WBC: 7.3 10*3/uL (ref 4.0–10.5)

## 2022-02-21 LAB — COMPREHENSIVE METABOLIC PANEL
ALT: 16 U/L (ref 0–53)
AST: 12 U/L (ref 0–37)
Albumin: 4.4 g/dL (ref 3.5–5.2)
Alkaline Phosphatase: 78 U/L (ref 39–117)
BUN: 18 mg/dL (ref 6–23)
CO2: 29 mEq/L (ref 19–32)
Calcium: 9.9 mg/dL (ref 8.4–10.5)
Chloride: 100 mEq/L (ref 96–112)
Creatinine, Ser: 0.87 mg/dL (ref 0.40–1.50)
GFR: 93.31 mL/min (ref >60.00)
Glucose, Bld: 285 mg/dL — ABNORMAL HIGH (ref 70–99)
Potassium: 4 mEq/L (ref 3.5–5.1)
Sodium: 137 mEq/L (ref 135–145)
Total Bilirubin: 0.5 mg/dL (ref 0.2–1.2)
Total Protein: 7.6 g/dL (ref 6.0–8.3)

## 2022-02-21 LAB — IBC + FERRITIN
Ferritin: 97.7 ng/mL (ref 22.0–322.0)
Iron: 137 ug/dL (ref 42–165)
Saturation Ratios: 42.7 % (ref 20.0–50.0)
TIBC: 320.6 ug/dL (ref 250.0–450.0)
Transferrin: 229 mg/dL (ref 212.0–360.0)

## 2022-02-21 LAB — SEDIMENTATION RATE: Sed Rate: 26 mm/hr — ABNORMAL HIGH (ref 0–20)

## 2022-02-21 LAB — C-REACTIVE PROTEIN: CRP: 1.5 mg/dL (ref 0.5–20.0)

## 2022-02-21 NOTE — Patient Instructions (Addendum)
Your provider has requested that you go to the basement level for lab work before leaving today. Press "B" on the elevator. The lab is located at the first door on the left as you exit the elevator.  Please call to schedule your bone density test [phone].   Preparation for test is as follows:  If you are taking calcium, discontinue this 24-48 hours prior to your appointment.  Wear pants with an elastic waistband (or without any metal such as a zipper).  Do not wear an underwire bra.  We do have gowns if you are unable to find appropriate clothing without metal.  Please bring a list of all current medications.  We discussed starting vedolizumab. The brand name is Entyvio. Here is some additional information. Let us know if you have any questions regarding this information.  How it works: This medication belongs to a class of drugs called biologics. It helps to reduce irritation and swelling (inflammation) in the intestines. In some cases, this medication is used by itself. In other cases, this medication is used together with another medication to achieve better results. This medication is also considered an integrin receptor antagonist. This means that the medication blocks white cells that are going to the colon from leaving the blood stream. These white cells continue to defend your blood stream from infection but they no longer hurt your colon.  How it is taken: It is injected into a vein in a procedure called an infusion. The infusion is given at a certified infusion center and lasts approximately 30 minutes. Your healthcare team may adjust the dose and how often you receive it, but typically it is given once every 8 weeks.  Possible side effects: Side effects can include common cold, headache, joint pain, nausea, and fever.  Special considerations: An allergic reaction can happen during or after the infusion (rash; itching; swelling of lips, tongue, throat or face; shortness of  breath; heart palpitations), there have been reports of infections, and liver problems can occur  Monitoring: Be sure to get tested for tuberculosis and Hepatitis B before taking this medication. We will follow heck routine labs like blood count, liver enzymes, kidney function and inflammatory markers while on this medication.  Points to remember: Before taking this medication, let us know about other medical conditions that you may have or other medications (even over-the-counter medications or complementary therapies) you may be taking.  Other tips: The best way to control your disease is by taking your medication as directed. Even when you do not have any symptoms, it is very important to continue taking your medication to prevent your disease from becoming active again. Do not alter the amount of the medication or how frequently you take it on your own. If you have any side effects or you continue to have symptoms, speak to your healthcare team immediately.  For further information, please check out http://www.ibdmedicationguide.org  I recommend that you use sunscreen and sun-protective clothing whenever you are outside.  Keeping your bones healthy is important. I recommend that you take daily calcium and Vitamin D supplements. Weight bearing exercise is also good for your bones. We discussed a bone density test given how long you have been on prednisone.  I recommend that you go to the [employer] website for additional information about achieving good health.

## 2022-02-21 NOTE — Progress Notes (Signed)
Referring Provider: Yvonna Alanis, NP Primary Care Physician:  Harold Alanis, NP  Chief complaint:  Ulcerative colitis   IMPRESSION:  Pancolonic ulcerative colitis. Last colonoscopy 07/13/2021 showed chronic ulcerative colitis to the right and left colon. No abdominal pain, passing 3 to 4 solids non bloody stools every morning. He is working to taper his steroids. Recommend starting Entyvio for better long term management as he did not show clinical improvement to Humira or Remicade when using them for AS. Continue Sulfasalazine in the meantime. 1021m po bid and Prednisone 345mpo QD for now.  Chronic prednisone use. Bone density testing recommended. Will work to completely discontinue his steroids.    Hyperplastic polyp removed from the ascending colon per colonoscopy 07/13/2021. Will need    GERD Continue Pantoprazole 4052mD   Ankylosing spondylitis of multiple sites in spine. Previously treated with Humira and Remicade without noticeable improvement with UC symptoms.  Continue follow-up with Dr. BeeAmil AmenPLAN: - CRP, ESR, Fecal calprotectin - Iron, ferritin - CBC, CMP - Continue Sulfasalazine 1000m68m bid and Prednisone 3mg 78mQD for now - Start Entyvio 300 mg IV at zero, 2, and six weeks, and then every 8 weeks - Plan prednisone taper while on Entyvio - Bone density  - Repeat colonoscopy in 2024 or 2025 for colon cancer screening   HPI: [name] 61 y.55 male who returns in follow-up after his initial consultation and subsequent colonoscopy earlier this year. This is my first office visit with [name] internal history is obtained through the patient and review of his electronic health record. He has a history of coronary artery disease s/p stent to the LAD 2008 s/p STEMI s/p DES x two 11/2019 on ASA and Plavix,  DM II, GERD, ankylosing spondylitis and ulcerative colitis. [name] [place] Humira in the past for ankylosing spondylitis then switched to Remicade in 2017 for about 3 years for ankylosing spondylitis per rheumatology.   Colonoscopy 07/13/2021 which showed mild ulcerative colitis Mayo score 1, diverticulosis to the sigmoid and descending colon and a 3-4 hyperplastic polyp in the ascending colon. Ascending colon with chronic colitis moderately active phase, further colon biopsies showed chronic ulcerative colitis throughout the colon at 90 - 62cm, nonspecific inflammation at 30 - 20 cm without evidence of active IBD then chronic ulcerative colitis at 10cm and no evidence of IBD to the rectum.   He returns today in follow-up.  He remains on very low dose prednisone. He has been on prednisone since 1993 or 1994 and unsuccessfully tried to wean off it a couple of times due to arthritic pain. and has recently tried to reduce his dose. He remains on Sulfasalazine 1000 mg po bid.  He denies abdominal pain. Appetite is good. Energy level is good. Weight is stable.   He cannot remember when his bowel habits were last normal. He will spend 90-120 minutes in the bathroom each day. No straining but there is a frequent sense of incomplete evacuation. No blood or mucous. No urgency or tenesmus.  He has tried to self-taper his prednisone as every doctor has told him about the long-term risks.   Labs in March 2023 showed a sedimentation rate of 23, normal CRP at 1.5 Testing at that time was also negative for hepatitis B surface antigen and QuantiFERON TB testing  IBD history:  Surgical  history: None  Current medications: Prednisone 3 mg daily since the 1990s, sulfasalazine 2000 mg BID Prior medications: Previously on Humira and Remicade for AS but he didn't think this helped his colitis Last colonoscopy: 07/13/2021 Extraintestinal manifestations: AS followed by Dr. Amil Carter   Past Medical History:  Diagnosis Date   Anemia    Ankylosing spondylitis ([place])     Arthritis    Asthma    SINCE AGE 56   Colitis, ulcerative ([place])    Coronary artery disease    Bare metal LAD stent 10/08   Coronary atherosclerosis of native coronary artery 12/25/2012   ED (erectile dysfunction)    Elevated prostate specific antigen (PSA) 12/25/2012   Esophageal reflux 12/25/2012   Extrinsic asthma, unspecified 12/25/2012   Fibromyalgia    GERD (gastroesophageal reflux disease)    History of heart attack    Hypercalcemia 12/25/2012   Hyperlipidemia    Left sided ulcerative (chronic) colitis ([place]) 12/25/2012   Mixed hyperlipidemia 12/25/2012   Pneumonia    as a child   Pure hypercholesterolemia 12/25/2012   Skin cancer of eyelid    TMJ (dislocation of temporomandibular joint)     Past Surgical History:  Procedure Laterality Date   COLONOSCOPY     CORONARY/GRAFT ACUTE MI REVASCULARIZATION N/A 11/23/2019   Procedure: Coronary/Graft Acute MI Revascularization;  Surgeon: Sherren Mocha, MD;  Location: [facility];  Service: Cardiovascular;  Laterality: N/A;   EYE SURGERY Bilateral    EYE SURGERY     heart stent     LEFT HEART CATH AND CORONARY ANGIOGRAPHY N/A 11/23/2019   Procedure: LEFT HEART CATH AND CORONARY ANGIOGRAPHY;  Surgeon: Sherren Mocha, MD;  Location: [facility];  Service: Cardiovascular;  Laterality: N/A;   skin cancer removed from left eyelid      skull fracture after falling down stairs     TONSILLECTOMY      Current Outpatient Medications  Medication Sig Dispense Refill   albuterol (PROVENTIL HFA;VENTOLIN HFA) 108 (90 BASE) MCG/ACT inhaler Inhale 2 puffs into the lungs every 6 (six) hours as needed for wheezing.     aspirin 81 MG tablet Take 81 mg by mouth daily.      clopidogrel (PLAVIX) 75 MG tablet TAKE 1 TABLET BY MOUTH EVERY DAY 90 tablet 0   cyclobenzaprine (FLEXERIL) 5 MG tablet Take 5 mg by mouth at bedtime.      dicyclomine (BENTYL) 20 MG tablet Take 1 tablet (20 mg total) by mouth 3 (three) times daily as needed for  spasms. 40 tablet 0   Evolocumab (REPATHA SURECLICK) 782 MG/ML SOAJ Inject 1 pen  into the skin every 14 (fourteen) days. 2 mL 11   Ferrous Gluconate (IRON 27 PO) Take 1 tablet by mouth daily.     ferrous sulfate 325 (65 FE) MG EC tablet Take 325 mg by mouth daily with breakfast.      gabapentin (NEURONTIN) 100 MG capsule Take 200 mg by mouth at bedtime.     HYDROcodone-acetaminophen (NORCO) 10-325 MG per tablet Take 0.5-1 tablets by mouth every 6 (six) hours as needed for severe pain.   0   icosapent Ethyl (VASCEPA) 1 g capsule TAKE 2 CAPSULES BY MOUTH TWICE A DAY 120 capsule 0   loratadine (CLARITIN) 10 MG tablet Take 10 mg by mouth daily.     metFORMIN (GLUCOPHAGE-XR) 500 MG 24 hr tablet Take 1 tablet (500 mg total) by mouth daily. Needs an appointment before anymore future refills. 90 tablet 0  metoprolol succinate (TOPROL-XL) 50 MG 24 hr tablet TAKE 1 TABLET BY MOUTH EVERY DAY 90 tablet 3   Multiple Vitamins-Minerals (CENTRUM CARDIO) TABS Take 1 tablet by mouth 2 (two) times daily.      nitroGLYCERIN (NITROSTAT) 0.4 MG SL tablet Place 1 tablet (0.4 mg total) under the tongue every 5 (five) minutes as needed for chest pain. 25 tablet 6   pantoprazole (PROTONIX) 40 MG tablet TAKE 1 TABLET (40 MG TOTAL) BY MOUTH DAILY. PLEASE SCHEDULE AN APPT. 90 tablet 1   predniSONE (DELTASONE) 1 MG tablet Take 3 mg by mouth daily.     sulfaSALAzine (AZULFIDINE) 500 MG EC tablet Take 2 tablets (1,000 mg total) by mouth 2 (two) times daily. Please keep your December appointment with Dr. Tarri Glenn 120 tablet 1   theophylline (THEODUR) 300 MG 12 hr tablet Take 300 mg by mouth 2 (two) times daily.     No current facility-administered medications for this visit.    Allergies as of 02/21/2022   (No Known Allergies)    Family History  Problem Relation Age of Onset   Hypertension Mother    Lung cancer Father    Lung cancer Sister    GER disease Sister    Brain cancer Sister    Diabetes Maternal Grandmother     Emphysema Maternal Grandfather    COPD Maternal Grandfather    Pancreatic cancer Neg Hx    Stomach cancer Neg Hx    Colon cancer Neg Hx    Esophageal cancer Neg Hx    Colon polyps Neg Hx    Heart disease Neg Hx       Physical Exam: General:   Alert,  well-nourished, pleasant and cooperative in NAD Head:  Normocephalic and atraumatic. Eyes:  Sclera clear, no icterus.   Conjunctiva pink. Abdomen:  Soft, nontender, nondistended, normal bowel sounds, no rebound or guarding. No hepatosplenomegaly.   Neurologic:  Alert and  oriented x4;  grossly nonfocal Skin:  Intact without significant lesions or rashes. Psych:  Alert and cooperative. Normal mood and affect.    Kaisy Severino L. Tarri Glenn, MD, MPH 02/21/2022, 10:17 AM

## 2022-02-26 ENCOUNTER — Encounter: Payer: Self-pay | Admitting: Gastroenterology

## 2022-03-02 ENCOUNTER — Encounter: Payer: Self-pay | Admitting: Gastroenterology

## 2022-03-02 DIAGNOSIS — K5190 Ulcerative colitis, unspecified, without complications: Secondary | ICD-10-CM | POA: Insufficient documentation

## 2022-03-02 NOTE — Addendum Note (Signed)
Addended by: Bonner Puna H on: 03/02/2022 11:08 AM   Modules accepted: Orders

## 2022-03-08 ENCOUNTER — Telehealth: Payer: Self-pay | Admitting: Pharmacy Technician

## 2022-03-08 NOTE — Telephone Encounter (Addendum)
Auth Submission: APPROVED Payer: [employer] [place] Medication & CPT/J Code(s) submitted: Entyvio (Vedolizumab) [id number] Route of submission (phone, fax, portal):  Phone #[phone] - Breck Coons PHONE: [phone] Fax # Auth type: Buy/Bill Units/visits requested: x8 Reference number:  Approval from: 03/05/22 to 03/21/23   Note: for renewal auth 2025 please include on auth Billing at provider office level.   ENTYVIO co-pay card: approved ID: [id number] GR: [id number] BIN: [id number] PCN: [id number] PHONE: [phone]

## 2022-03-13 ENCOUNTER — Encounter: Payer: Self-pay | Admitting: Gastroenterology

## 2022-03-19 ENCOUNTER — Other Ambulatory Visit: Payer: Self-pay | Admitting: Orthopedic Surgery

## 2022-03-19 DIAGNOSIS — E1169 Type 2 diabetes mellitus with other specified complication: Secondary | ICD-10-CM

## 2022-03-21 ENCOUNTER — Other Ambulatory Visit: Payer: Self-pay | Admitting: Pharmacist

## 2022-03-21 ENCOUNTER — Ambulatory Visit: Payer: Self-pay

## 2022-03-21 MED ORDER — REPATHA SURECLICK 140 MG/ML ~~LOC~~ SOAJ: 1.0000 | SUBCUTANEOUS | 11 refills | Status: DC

## 2022-03-23 ENCOUNTER — Ambulatory Visit: Payer: Self-pay

## 2022-03-23 ENCOUNTER — Other Ambulatory Visit: Payer: Self-pay | Admitting: *Deleted

## 2022-03-23 DIAGNOSIS — E1169 Type 2 diabetes mellitus with other specified complication: Secondary | ICD-10-CM

## 2022-03-23 MED ORDER — METFORMIN HCL ER 500 MG PO TB24: 500.0000 mg | ORAL_TABLET | Freq: Every day | ORAL | 0 refills | Status: DC

## 2022-03-23 NOTE — Telephone Encounter (Signed)
Patient called and requested refill.  Appointment scheduled with Amy for 03/29/2022 Rx sent to pharmacy for #30.

## 2022-03-26 ENCOUNTER — Telehealth: Payer: Self-pay

## 2022-03-26 NOTE — Telephone Encounter (Signed)
**Note De-Identified  Obfuscation** I started a Icosapent PA through covermymeds. Key: [id number]

## 2022-03-27 ENCOUNTER — Other Ambulatory Visit: Payer: Self-pay | Admitting: Gastroenterology

## 2022-03-27 ENCOUNTER — Telehealth: Payer: Self-pay | Admitting: Pharmacist

## 2022-03-27 DIAGNOSIS — K5180 Other ulcerative colitis without complications: Secondary | ICD-10-CM

## 2022-03-27 NOTE — Telephone Encounter (Signed)
PA request received Key: [id number] Submitted to [facility]

## 2022-03-28 ENCOUNTER — Ambulatory Visit (INDEPENDENT_AMBULATORY_CARE_PROVIDER_SITE_OTHER): Payer: BC Managed Care – PPO | Admitting: *Deleted

## 2022-03-28 ENCOUNTER — Other Ambulatory Visit: Payer: Self-pay

## 2022-03-28 VITALS — BP 114/75 | HR 90 | Temp 98.2°F | Resp 16 | Ht 68.0 in | Wt 179.2 lb

## 2022-03-28 DIAGNOSIS — K5190 Ulcerative colitis, unspecified, without complications: Secondary | ICD-10-CM | POA: Diagnosis not present

## 2022-03-28 DIAGNOSIS — E1169 Type 2 diabetes mellitus with other specified complication: Secondary | ICD-10-CM

## 2022-03-28 MED ORDER — VEDOLIZUMAB 300 MG IV SOLR
300.0000 mg | Freq: Once | INTRAVENOUS | Status: AC
  Administered 2022-03-28: 300 mg via INTRAVENOUS
  Filled 2022-03-28: qty 5

## 2022-03-28 MED ORDER — METFORMIN HCL ER 500 MG PO TB24: 500.0000 mg | ORAL_TABLET | Freq: Every day | ORAL | 1 refills | Status: DC

## 2022-03-28 NOTE — Patient Instructions (Signed)
Vedolizumab Injection ?What is this medication? ?VEDOLIZUMAB (ve doe LIZ you mab) treats inflammatory bowel diseases. It works by decreasing inflammation in the digestive tract. ?This medicine may be used for other purposes; ask your health care provider or pharmacist if you have questions. ?COMMON BRAND NAME(S): Entyvio ?What should I tell my care team before I take this medication? ?They need to know if you have any of these conditions: ?Immune system problems ?Infection, such a tuberculosis (TB) or other bacterial, fungal, or viral infections ?Liver disease ?Recent or upcoming vaccine ?An unusual or allergic reaction to vedolizumab, other medications, foods, dyes, or preservatives ?Pregnant or trying to get pregnant ?Breast-feeding ?How should I use this medication? ?This medication is injected into a vein. It is given by your care team in a hospital or clinic setting. ?A special MedGuide will be given to you by the pharmacist with each prescription and refill. Be sure to read this information carefully each time. ?Talk to your care team about the use of this medication in children. It is not approved for use in children. ?Overdosage: If you think you have taken too much of this medicine contact a poison control center or emergency room at once. ?NOTE: This medicine is only for you. Do not share this medicine with others. ?What if I miss a dose? ?It is important not to miss your dose. Call your care team if you are unable to keep an appointment. ?What may interact with this medication? ?Steroid medications, such as prednisone or cortisone ?TNF-alpha inhibitors, such as natalizumab, adalimumab, and infliximab ?Vaccines ?This list may not describe all possible interactions. Give your health care provider a list of all the medicines, herbs, non-prescription drugs, or dietary supplements you use. Also tell them if you smoke, drink alcohol, or use illegal drugs. Some items may interact with your medicine. ?What should  I watch for while using this medication? ?Visit your care team for regular checks on your progress. Tell your care team if your symptoms do not start to get better or if they get worse. ?Stay away from people who are sick. Call your care team for advice if you get a fever, chills or sore throat, or other symptoms of a cold or flu. Do not treat yourself. ?In some patients, this medication may cause a serious brain infection that may cause death. If you have any problems seeing, thinking, speaking, walking, or standing, tell your care team right away. If you cannot reach your care team, get urgent medical care. ?What side effects may I notice from receiving this medication? ?Side effects that you should report to your care team as soon as possible: ?Allergic reactions--skin rash, itching, hives, swelling of the face, lips, tongue, or throat ?Dizziness, loss of balance or coordination, confusion or trouble speaking ?Infection--fever, chills, cough, or sore throat ?Infusion reactions--chest pain, shortness of breath or trouble breathing, feeling faint or lightheaded ?Liver injury--right upper belly pain, loss of appetite, nausea, light-colored stool, dark yellow or brown urine, yellowing skin or eyes, unusual weakness or fatigue ?Side effects that usually do not require medical attention (report to your care team if they continue or are bothersome): ?Headache ?Fatigue ?Joint pain ?Nausea ?This list may not describe all possible side effects. Call your doctor for medical advice about side effects. You may report side effects to FDA at 1-800-FDA-1088. ?Where should I keep my medication? ?This medication is given in a hospital or clinic and will not be stored at home. ?NOTE: This sheet is a summary. It   may not cover all possible information. If you have questions about this medicine, talk to your doctor, pharmacist, or health care provider. ?? 2023 Elsevier/Gold Standard (2020-12-15 00:00:00) ? ?

## 2022-03-28 NOTE — Progress Notes (Signed)
Diagnosis: Crohn's Disease  Provider:  Marshell Garfinkel MD  Procedure: Infusion  IV Type: Peripheral, IV Location: L Forearm  Entyvio (Vedolizumab), Dose: 300 mg  Infusion Start Time: 8185 am  Infusion Stop Time: 6314 am  Post Infusion IV Care: Observation period completed and Peripheral IV Discontinued  Discharge: Condition: Good, Destination: Home . AVS provided to patient.   Performed by:  Oren Beckmann, RN

## 2022-03-29 ENCOUNTER — Ambulatory Visit (INDEPENDENT_AMBULATORY_CARE_PROVIDER_SITE_OTHER): Payer: BC Managed Care – PPO | Admitting: Orthopedic Surgery

## 2022-03-29 ENCOUNTER — Encounter: Payer: Self-pay | Admitting: Orthopedic Surgery

## 2022-03-29 VITALS — BP 138/78 | HR 102 | Temp 97.8°F | Resp 20 | Ht 68.0 in | Wt 178.1 lb

## 2022-03-29 DIAGNOSIS — E785 Hyperlipidemia, unspecified: Secondary | ICD-10-CM | POA: Diagnosis not present

## 2022-03-29 DIAGNOSIS — H9193 Unspecified hearing loss, bilateral: Secondary | ICD-10-CM

## 2022-03-29 DIAGNOSIS — K5180 Other ulcerative colitis without complications: Secondary | ICD-10-CM

## 2022-03-29 DIAGNOSIS — E1169 Type 2 diabetes mellitus with other specified complication: Secondary | ICD-10-CM

## 2022-03-29 DIAGNOSIS — F32A Depression, unspecified: Secondary | ICD-10-CM

## 2022-03-29 DIAGNOSIS — J4530 Mild persistent asthma, uncomplicated: Secondary | ICD-10-CM | POA: Diagnosis not present

## 2022-03-29 DIAGNOSIS — K219 Gastro-esophageal reflux disease without esophagitis: Secondary | ICD-10-CM

## 2022-03-29 DIAGNOSIS — I2510 Atherosclerotic heart disease of native coronary artery without angina pectoris: Secondary | ICD-10-CM

## 2022-03-29 MED ORDER — THEOPHYLLINE ER 300 MG PO TB12: 300.0000 mg | ORAL_TABLET | Freq: Two times a day (BID) | ORAL | 3 refills | Status: DC

## 2022-03-29 MED ORDER — ALBUTEROL SULFATE HFA 108 (90 BASE) MCG/ACT IN AERS: 2.0000 | INHALATION_SPRAY | Freq: Four times a day (QID) | RESPIRATORY_TRACT | 1 refills | Status: DC | PRN

## 2022-03-29 MED ORDER — METFORMIN HCL ER 500 MG PO TB24: 500.0000 mg | ORAL_TABLET | Freq: Every day | ORAL | 1 refills | Status: DC

## 2022-03-29 MED ORDER — CLOPIDOGREL BISULFATE 75 MG PO TABS: 75.0000 mg | ORAL_TABLET | Freq: Every day | ORAL | 0 refills | Status: DC

## 2022-03-29 NOTE — Patient Instructions (Addendum)
[facility]> [facility] [phone]  Consider getting tetanus and Shingrix vaccines at local pharmacy  Wean prednisone> take 0.5 mg (1/2 tablet) daily for 7 days> then take every other day for 7 days then stop

## 2022-03-29 NOTE — Progress Notes (Signed)
Careteam: Patient Care Team: Harold Alanis, NP as PCP - General (Adult Health Nurse Practitioner) Jettie Booze, MD as PCP - Cardiology (Cardiology)  Seen by: Windell Moulding, AGNP-C  PLACE OF SERVICE:  [place] Directive information Does Patient Have a Medical Advance Directive?: No, Would patient like information on creating a medical advance directive?: No - Patient declined  No Known Allergies  Chief Complaint  Patient presents with   Medical Management of Chronic Issues    11-12 month follow-up visit    Quality Metric Gaps    To Discuss Yearly Eye Exam, Shingrix, Covid-19, Dtap, and Influenza vaccine. [facility] verifed     HPI: Patient is a 62 y.o. male seen today for medical management of chronic conditions.   Ulcerative colitis- followed by GI, first Vedlizumab (Entyvio) infusion yesterday, tolerated well, cannot tell any difference yet. He has been tapering oral prednisone, now on 1 mg, asking how to taper off.  Plan to recheck A1c today.   Reports mild depression, would like referral to start therapy. No plans to harm self. Does not want medication.   No recent asthma exacerbations, remains on theophylline and albuterol prn.   Refused flu vaccine.   Bilateral cataracts surgery 2023. Vision improved.   He saw audiologist last year.   Discussed tetanus and shingrix vaccinations.   Review of Systems:  Review of Systems  Constitutional:  Negative for chills and fever.  HENT:  Positive for hearing loss. Negative for sore throat.   Eyes:  Negative for blurred vision and double vision.  Respiratory:  Negative for cough, shortness of breath and wheezing.   Cardiovascular:  Negative for chest pain and leg swelling.  Gastrointestinal:  Positive for diarrhea.  Genitourinary:  Negative for dysuria.  Musculoskeletal:  Negative for falls and joint pain.  Skin:  Negative for rash.  Neurological:  Negative for dizziness and headaches.   Psychiatric/Behavioral:  Positive for depression. The patient is not nervous/anxious and does not have insomnia.     Past Medical History:  Diagnosis Date   Anemia    Ankylosing spondylitis (HCC)    Arthritis    Asthma    SINCE AGE 4   Colitis, ulcerative ([place])    Coronary artery disease    Bare metal LAD stent 10/08   Coronary atherosclerosis of native coronary artery 12/25/2012   ED (erectile dysfunction)    Elevated prostate specific antigen (PSA) 12/25/2012   Esophageal reflux 12/25/2012   Extrinsic asthma, unspecified 12/25/2012   Fibromyalgia    GERD (gastroesophageal reflux disease)    History of heart attack    Hypercalcemia 12/25/2012   Hyperlipidemia    Left sided ulcerative (chronic) colitis ([place]) 12/25/2012   Mixed hyperlipidemia 12/25/2012   Pneumonia    as a child   Pure hypercholesterolemia 12/25/2012   Skin cancer of eyelid    TMJ (dislocation of temporomandibular joint)    Past Surgical History:  Procedure Laterality Date   COLONOSCOPY     CORONARY/GRAFT ACUTE MI REVASCULARIZATION N/A 11/23/2019   Procedure: Coronary/Graft Acute MI Revascularization;  Surgeon: Sherren Mocha, MD;  Location: [facility];  Service: Cardiovascular;  Laterality: N/A;   EYE SURGERY Bilateral    EYE SURGERY     heart stent     LEFT HEART CATH AND CORONARY ANGIOGRAPHY N/A 11/23/2019   Procedure: LEFT HEART CATH AND CORONARY ANGIOGRAPHY;  Surgeon: Sherren Mocha, MD;  Location: [facility];  Service: Cardiovascular;  Laterality: N/A;  skin cancer removed from left eyelid      skull fracture after falling down stairs     TONSILLECTOMY     Social History:   reports that he has never smoked. He has quit using smokeless tobacco.  His smokeless tobacco use included snuff. He reports current alcohol use. He reports that he does not use drugs.  Family History  Problem Relation Age of Onset   Hypertension Mother    Lung cancer Father    Lung cancer Sister     GER disease Sister    Brain cancer Sister    Diabetes Maternal Grandmother    Emphysema Maternal Grandfather    COPD Maternal Grandfather    Pancreatic cancer Neg Hx    Stomach cancer Neg Hx    Colon cancer Neg Hx    Esophageal cancer Neg Hx    Colon polyps Neg Hx    Heart disease Neg Hx     Medications: Patient's Medications  New Prescriptions   No medications on file  Previous Medications   ALBUTEROL (PROVENTIL HFA;VENTOLIN HFA) 108 (90 BASE) MCG/ACT INHALER    Inhale 2 puffs into the lungs every 6 (six) hours as needed for wheezing.   ASPIRIN 81 MG TABLET    Take 81 mg by mouth daily.    CLOPIDOGREL (PLAVIX) 75 MG TABLET    TAKE 1 TABLET BY MOUTH EVERY DAY   CYCLOBENZAPRINE (FLEXERIL) 5 MG TABLET    Take 5 mg by mouth at bedtime.    DICYCLOMINE (BENTYL) 20 MG TABLET    Take 1 tablet (20 mg total) by mouth 3 (three) times daily as needed for spasms.   EVOLOCUMAB (REPATHA SURECLICK) 174 MG/ML SOAJ    Inject 140 mg into the skin every 14 (fourteen) days.   FERROUS GLUCONATE (IRON 27 PO)    Take 1 tablet by mouth daily.   FERROUS SULFATE 325 (65 FE) MG EC TABLET    Take 325 mg by mouth daily with breakfast.    GABAPENTIN (NEURONTIN) 100 MG CAPSULE    Take 200 mg by mouth at bedtime.   HYDROCODONE-ACETAMINOPHEN (NORCO) 10-325 MG PER TABLET    Take 0.5-1 tablets by mouth every 6 (six) hours as needed for severe pain.    ICOSAPENT ETHYL (VASCEPA) 1 G CAPSULE    TAKE 2 CAPSULES BY MOUTH TWICE A DAY   LORATADINE (CLARITIN) 10 MG TABLET    Take 10 mg by mouth daily.   METOPROLOL SUCCINATE (TOPROL-XL) 50 MG 24 HR TABLET    TAKE 1 TABLET BY MOUTH EVERY DAY   MULTIPLE VITAMINS-MINERALS (CENTRUM CARDIO) TABS    Take 1 tablet by mouth 2 (two) times daily.    NITROGLYCERIN (NITROSTAT) 0.4 MG SL TABLET    Place 1 tablet (0.4 mg total) under the tongue every 5 (five) minutes as needed for chest pain.   PANTOPRAZOLE (PROTONIX) 40 MG TABLET    TAKE 1 TABLET (40 MG TOTAL) BY MOUTH DAILY. PLEASE  SCHEDULE AN APPT.   PREDNISONE (DELTASONE) 1 MG TABLET    Take 3 mg by mouth daily.   SULFASALAZINE (AZULFIDINE) 500 MG EC TABLET    TAKE 2 TABLET BY MOUTH TWICE DAILY   THEOPHYLLINE (THEODUR) 300 MG 12 HR TABLET    Take 300 mg by mouth 2 (two) times daily.  Modified Medications   Modified Medication Previous Medication   METFORMIN (GLUCOPHAGE-XR) 500 MG 24 HR TABLET metFORMIN (GLUCOPHAGE-XR) 500 MG 24 hr tablet      Take  1 tablet (500 mg total) by mouth daily.    Take 1 tablet (500 mg total) by mouth daily.  Discontinued Medications   No medications on file    Physical Exam:  There were no vitals filed for this visit. There is no height or weight on file to calculate BMI. Wt Readings from Last 3 Encounters:  03/28/22 179 lb 3.2 oz (81.3 kg)  02/21/22 180 lb (81.6 kg)  09/14/21 184 lb 9.6 oz (83.7 kg)    Physical Exam Vitals reviewed.  Constitutional:      General: He is not in acute distress. HENT:     Head: Normocephalic.  Eyes:     General:        Right eye: No discharge.        Left eye: No discharge.  Neck:     Vascular: No carotid bruit.  Cardiovascular:     Rate and Rhythm: Normal rate and regular rhythm.     Pulses: Normal pulses.     Heart sounds: Normal heart sounds.  Pulmonary:     Effort: Pulmonary effort is normal. No respiratory distress.     Breath sounds: Normal breath sounds. No wheezing.  Abdominal:     General: Bowel sounds are normal. There is no distension.     Palpations: Abdomen is soft.     Tenderness: There is no abdominal tenderness.  Musculoskeletal:     Cervical back: Neck supple.     Right lower leg: No edema.     Left lower leg: No edema.  Lymphadenopathy:     Cervical: No cervical adenopathy.  Skin:    General: Skin is warm and dry.     Capillary Refill: Capillary refill takes less than 2 seconds.  Neurological:     General: No focal deficit present.     Mental Status: He is alert and oriented to person, place, and time.   Psychiatric:        Mood and Affect: Mood normal.        Behavior: Behavior normal.     Labs reviewed: Basic Metabolic Panel: Recent Labs    04/27/21 1202 02/21/22 1101  NA 139 137  K 4.2 4.0  CL 103 100  CO2 28 29  GLUCOSE 202* 285*  BUN 17 18  CREATININE 0.88 0.87  CALCIUM 10.5* 9.9   Liver Function Tests: Recent Labs    04/27/21 1202 02/21/22 1101  AST 15 12  ALT 19 16  ALKPHOS  --  78  BILITOT 0.6 0.5  PROT 7.4 7.6  ALBUMIN  --  4.4   No results for input(s): "LIPASE", "AMYLASE" in the last 8760 hours. No results for input(s): "AMMONIA" in the last 8760 hours. CBC: Recent Labs    04/27/21 1202 02/21/22 1101  WBC 7.0 7.3  NEUTROABS 4,102 3.2  HGB 15.0 14.7  HCT 44.9 43.0  MCV 91.3 92.0  PLT 289 283.0   Lipid Panel: No results for input(s): "CHOL", "HDL", "LDLCALC", "TRIG", "CHOLHDL", "LDLDIRECT" in the last 8760 hours. TSH: No results for input(s): "TSH" in the last 8760 hours. A1C: Lab Results  Component Value Date   HGBA1C 7.3 (H) 03/03/2021     Assessment/Plan 1. Type 2 diabetes mellitus with other specified complication, without long-term current use of insulin (HCC) - A1c 7.3 02/2021 - eye exam 10/31/2021 - no hypoglycemias - cont asa, metformin - metFORMIN (GLUCOPHAGE-XR) 500 MG 24 hr tablet; Take 1 tablet (500 mg total) by mouth daily.  Dispense: 90 tablet;  Refill: 1 - Hemoglobin A1c - Microalbumin/Creatinine Ratio, Urine  2. Mild persistent asthma without complication - no recent exacerbations - no wheezing on exam - cont albuterol prn and theophylline  - albuterol (VENTOLIN HFA) 108 (90 Base) MCG/ACT inhaler; Inhale 2 puffs into the lungs every 6 (six) hours as needed for wheezing.  Dispense: 6.7 g; Refill: 1 - theophylline (THEODUR) 300 MG 12 hr tablet; Take 1 tablet (300 mg total) by mouth 2 (two) times daily.  Dispense: 300 tablet; Refill: 3  3. Hyperlipidemia LDL goal <70 - total 136, LDL 64 02/2021 - not fasting today -  lipid panel- future  4. Gastroesophageal reflux disease without esophagitis - hgb 14.7 (12/06) - cont Protonix  5. Other ulcerative colitis without complication ([place]) - followed by GI - colonoscopy 07/13/2021> showed chronic UC to right and left colon - started Vedlizumab Weyman Rodney) infusion yesterday - cont Sulfasalazine   6. Atherosclerosis of native coronary artery of native heart without angina pectoris - followed by cardiology - MI 2021 - cont Plavix - clopidogrel (PLAVIX) 75 MG tablet; Take 1 tablet (75 mg total) by mouth daily.  Dispense: 90 tablet; Refill: 0  7. Bilateral hearing loss, unspecified hearing loss type - evaluated by audiology - HOH during encounter - no hearing aids   8. Mild depression - limited in discussing depression - on plan to harm self - refused medication - wants referral to counseling> [facility]  Total time: 31 minutes. Greater than 50% of total time spent doing patient education regarding health maintenance, ulcerative colitis, depression, T2DM including symptom/medication management.    Next appt: Visit date not found  [place], [facility] [phone]

## 2022-03-30 ENCOUNTER — Encounter: Payer: Self-pay | Admitting: Gastroenterology

## 2022-03-30 ENCOUNTER — Other Ambulatory Visit: Payer: Self-pay | Admitting: Orthopedic Surgery

## 2022-03-30 DIAGNOSIS — E1169 Type 2 diabetes mellitus with other specified complication: Secondary | ICD-10-CM

## 2022-03-30 LAB — HEMOGLOBIN A1C
Hgb A1c MFr Bld: 8.8 % of total Hgb — ABNORMAL HIGH (ref <5.7)
Mean Plasma Glucose: 206 mg/dL
eAG (mmol/L): 11.4 mmol/L

## 2022-03-30 LAB — MICROALBUMIN / CREATININE URINE RATIO
Creatinine, Urine: 122 mg/dL (ref 20–320)
Microalb Creat Ratio: 6 mcg/mg creat (ref <30)
Microalb, Ur: 0.7 mg/dL

## 2022-03-30 MED ORDER — METFORMIN HCL ER 500 MG PO TB24: 500.0000 mg | ORAL_TABLET | Freq: Two times a day (BID) | ORAL | 1 refills | Status: DC

## 2022-03-30 NOTE — Addendum Note (Signed)
Addended by: Logan Bores on: 03/30/2022 10:08 AM   Modules accepted: Orders

## 2022-04-02 ENCOUNTER — Ambulatory Visit (INDEPENDENT_AMBULATORY_CARE_PROVIDER_SITE_OTHER): Payer: BC Managed Care – PPO | Admitting: Behavioral Health

## 2022-04-02 ENCOUNTER — Encounter: Payer: Self-pay | Admitting: Gastroenterology

## 2022-04-02 DIAGNOSIS — F32A Depression, unspecified: Secondary | ICD-10-CM

## 2022-04-02 DIAGNOSIS — F411 Generalized anxiety disorder: Secondary | ICD-10-CM | POA: Diagnosis not present
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
## 2022-04-05 ENCOUNTER — Encounter: Payer: Self-pay | Admitting: Gastroenterology

## 2022-04-07 ENCOUNTER — Other Ambulatory Visit: Payer: Self-pay | Admitting: Physician Assistant

## 2022-04-10 ENCOUNTER — Ambulatory Visit (INDEPENDENT_AMBULATORY_CARE_PROVIDER_SITE_OTHER): Payer: BC Managed Care – PPO | Admitting: Behavioral Health

## 2022-04-10 DIAGNOSIS — F32A Depression, unspecified: Secondary | ICD-10-CM | POA: Diagnosis not present

## 2022-04-10 DIAGNOSIS — H903 Sensorineural hearing loss, bilateral: Secondary | ICD-10-CM | POA: Diagnosis not present

## 2022-04-10 NOTE — Progress Notes (Signed)
[facility] Counselor/Therapist Progress Note  Patient ID: [name], MRN: [id number],    Date: 04/16/2022  Time Spent: 55 minutes   Treatment Type: Individual Therapy  Reported Symptoms: Sadness   Mental Status Exam:  Appearance:   Casual     Behavior:  Appropriate and Sharing  Motor:  Normal  Speech/Language:   Clear and Coherent  Affect:  Tearful  Mood:  sad  Thought process:  normal  Thought content:    WNL  Sensory/Perceptual disturbances:    WNL  Orientation:  oriented to person  Attention:  Good  Concentration:  Good  Memory:  WNL  Fund of knowledge:   Good  Insight:    Good  Judgment:   Good  Impulse Control:  Good   Risk Assessment: Danger to Self:  No Self-injurious Behavior: No Danger to Others: No Duty to Warn:no Physical Aggression / Violence:No  Access to Firearms a concern: No  Gang Involvement:No   Subjective:   The patient reports since last being seen he states "Its easing off, the grief a little" he became emotional in session. He reports to continue to cry daily, however he states "Overall I am getting better" he reports to have flashbacks of the day when his dog was hit by a truck and died. He states it was a sudden death the pet was 8 years old. He reports making plans to grow old with his pet. In her honor he has obtained an urn with his pets ashes. The patient reports it has been three weeks since his pet died and he is having challenges with remembering good memories. He states feeling the saddest when "I wake up and I am okay and think about what I got to do and then about an hour I start crying. I think its at night time it starts kicking in the sadness". He identified what he miss the most about his pet is not being near him when he is watching TV and states what he liked the most was the dog's personality. He reports currently feeling "Coming out of the clouds a little bit". He is starting to read articles on his hobby of baseball.  He reports plans to schedule a trip in the future with his wife, however he feels guilt for wanting to move forward past the death of his dog. The patient reports "You got to keep loving your life". He states his pet brought him a lot of joy. He identified his support as talking with old friends from college. He reports implementing physical care by attending a local gym to work out. He attends the gym three times a week. He is monitoring his diet he states to eat two meals a day. He reports to receive approximately 7 hours of sleep a night.    He talked with his pastor last week and states his pastor shared some scriptures with him that was helpful. He is attending church and reports he continues to read a daily devotional book 5 out of 7 times a day and states he utilizes prayer to cope. He reports processing the information his mother shared with him as "I think I kind of buried it". He states he talks with his wife about it, however he has not brought it up to his mother. He reports "I kind of always suspect I might of been a love child". He has plans to visit his mom this weekend and states it will be the first time  he will visit without his pet. Reports having thoughts of questioning his parents love, however his relationship with his mother has not changed. He reports "The relationship is good, but I guess not great because on my part". The patient identified his take away today as "Trying to think positive thoughts". He rated depression at a 5 and rated anxiety at a 3 today on a scale of 1 to 10 with 10 being severe. He denied SI/HI, AH/VH.   The counselor discussed mental health symptoms experienced and requested the patient rate his symptoms. The counselor discussed grief and loss with the patient regarding his pet. counselor questioned how the patient can honor his pet to remember her. The counselor discussed self care with the patient to include physical, psychological, emotional, and spiritual  wellness. The counselor discussed scheduling activities for the patient to look forward to in order to help him manage his moods. The counselor processed with the patient regarding his mother sharing information regarding his conception. The counselor educated the patient on thoughts/feelings/behaviors are connected and discussed reframing negative automatic thoughts. The counselor assessed for SI/HI, AH/VH.  Interventions: Grief Therapy  Diagnosis:   ICD-10-CM   1. Depression, unspecified depression type  F32.A       Plan:   1) Long Term Goal: Begin a healthy grieving process around the loss of his pet.      Short Term Goal: Explore and resolve grief and loss issues.       Objective: Get through a week without a crying spell.      Objective: Discuss issues of grief weekly with therapist.    2) Long Term Goal: Alleviate depressive symptoms and return to the previous level of effective functioning.      Short Term Goal: Improve overall mood.     Objective: Develop strategies for thought distraction when ruminating on the past.     Objective: Report feeling happy/better overall mood during therapy sessions.   3) Long Term Goal: Develop strategies to reduce symptoms.      Short Term Goal: Reduce anxiety and improve coping skills.     Objective: Develop strategies for thought distraction when fixating on the future.      Objective: Learn two new ways of coping with routine stressors.       Jannifer Hick, [facility]

## 2022-04-11 ENCOUNTER — Ambulatory Visit (INDEPENDENT_AMBULATORY_CARE_PROVIDER_SITE_OTHER): Payer: BC Managed Care – PPO

## 2022-04-11 VITALS — BP 115/73 | HR 95 | Temp 98.3°F | Resp 16 | Ht 68.0 in | Wt 176.4 lb

## 2022-04-11 DIAGNOSIS — K5190 Ulcerative colitis, unspecified, without complications: Secondary | ICD-10-CM | POA: Diagnosis not present

## 2022-04-11 MED ORDER — VEDOLIZUMAB 300 MG IV SOLR
300.0000 mg | Freq: Once | INTRAVENOUS | Status: AC
  Administered 2022-04-11: 300 mg via INTRAVENOUS
  Filled 2022-04-11: qty 5

## 2022-04-11 NOTE — Progress Notes (Signed)
Diagnosis: Crohn's Disease  Provider:  Marshell Garfinkel MD  Procedure: Infusion  IV Type: Peripheral, IV Location: L Forearm  Entyvio (Vedolizumab), Dose: 300 mg  Infusion Start Time: 1027  Infusion Stop Time: 1130  Post Infusion IV Care: Peripheral IV Discontinued  Discharge: Condition: Good, Destination: Home . AVS provided to patient.   Performed by:  Adelina Mings, LPN

## 2022-04-15 ENCOUNTER — Encounter: Payer: Self-pay | Admitting: Behavioral Health

## 2022-04-16 ENCOUNTER — Encounter: Payer: Self-pay | Admitting: Behavioral Health

## 2022-04-17 ENCOUNTER — Ambulatory Visit (INDEPENDENT_AMBULATORY_CARE_PROVIDER_SITE_OTHER): Payer: BC Managed Care – PPO | Admitting: Behavioral Health

## 2022-04-17 DIAGNOSIS — F32A Depression, unspecified: Secondary | ICD-10-CM

## 2022-04-17 NOTE — Progress Notes (Unsigned)
[facility] Counselor/Therapist Progress Note  Patient ID: [name], MRN: [id number],    Date: 04/18/2022  Time Spent: 50 minutes   Treatment Type: Individual Therapy  Reported Symptoms: Depression   Mental Status Exam:  Appearance:   Casual     Behavior:  Appropriate and Sharing  Motor:  Normal  Speech/Language:   Clear and Coherent  Affect:  Appropriate  Mood:  normal  Thought process:  normal  Thought content:    WNL  Sensory/Perceptual disturbances:    WNL  Orientation:  oriented to person  Attention:  Good  Concentration:  Good  Memory:  WNL  Fund of knowledge:   Good  Insight:    Good  Judgment:   Good  Impulse Control:  Good   Risk Assessment: Danger to Self:  No Self-injurious Behavior: No Danger to Others: No Duty to Warn:no Physical Aggression / Violence:No  Access to Firearms a concern: No  Gang Involvement:No   Subjective:   The patient reports his moods have "Been up and down" states he recently visited his mother and it was the first time he visited her without his deceased pet. He rated depression at a 7 today on a scale of 1 to 10 with 10 being severe. He states "I feel pretty good about things today". He rated anxiety at a 5 on a scale of 1 to 10 with 10 being severe. The patient reports if anything make him think about his deceased pet he does not feel happy about the good memories just sad that she is no longer here. He reports recently having a crying spell. He participated in an activity in session with this counselor utilizing positive self talk. The patient identified positive self talk coping strategies as "I can feel bad and choose to take a new direction, I don't have to rush, my mind is not always my friend". He identified a positive view of himself as "I have a good sense of humor, strong empathy skills, in the past I have been able to be a leader". He identified a positive view of the future as "I think in the near future my wife  and I financial position is looking good and we should be able to travel a sense of financial security". He identified unhelpful negative thoughts that cause depression as "There must be something wrong with me, I can't handle it". He identified helpful thoughts to reframe his negative thoughts as "No one is perfect, there are people that care about me, I deserve to be respected, I can do something's pretty well". The patient identified his take away today as "If you think positive then positive things will follow". Patient denied SI/HI, AH/VH.   Counselor questioned the patients mental health symptoms and requested he rate his symptoms. Counselor discussed common unhelpful thoughts that affect moods and cause depression. Counselor requested feedback from the patient on unhelpful thoughts he has experienced. Counselor educated the patient on utilizing positive self talk and monitoring his internal dialogue. Counselor implemented an activity in session with the patients permission to assist the patient with learning how to incorporate positive self talk into his daily routine. Counselor requested feedback from the patient on positive self talk coping strategies he may utilize to assist him in difficult or distressing situations. Counselor discussed coping strategies of utilizing gratitude list, practicing random acts of kindness, talking to a trusted friend and journaling to help the patient cope with his moods. Counselor gave the patient psycho  educational material on positive self talk to assist him with learning the coping mechanism and discussing its effectiveness in the next session. Counselor assessed for SI/HI, AH/VH.   Interventions: Motivational Interviewing and Solution-Oriented/Positive Psychology  Diagnosis:   ICD-10-CM   1. Depression, unspecified depression type  F32.A       Plan:   1) Long Term Goal: Begin a healthy grieving process around the loss of his pet.      Short Term Goal:  Explore and resolve grief and loss issues.       Objective: Get through a week without a crying spell.      Objective: Discuss issues of grief weekly with therapist.    2) Long Term Goal: Alleviate depressive symptoms and return to the previous level of effective functioning.      Short Term Goal: Improve overall mood.     Objective: Develop strategies for thought distraction when ruminating on the past.     Objective: Report feeling happy/better overall mood during therapy sessions.   3) Long Term Goal: Develop strategies to reduce symptoms.      Short Term Goal: Reduce anxiety and improve coping skills.     Objective: Develop strategies for thought distraction when fixating on the future.      Objective: Learn two new ways of coping with routine stressors.     Jannifer Hick, [facility]

## 2022-04-18 ENCOUNTER — Encounter: Payer: Self-pay | Admitting: Behavioral Health

## 2022-04-18 ENCOUNTER — Ambulatory Visit: Payer: PRIVATE HEALTH INSURANCE | Admitting: Interventional Cardiology

## 2022-04-19 DIAGNOSIS — M8580 Other specified disorders of bone density and structure, unspecified site: Secondary | ICD-10-CM | POA: Diagnosis not present

## 2022-04-19 DIAGNOSIS — M450 Ankylosing spondylitis of multiple sites in spine: Secondary | ICD-10-CM | POA: Diagnosis not present

## 2022-04-19 DIAGNOSIS — M797 Fibromyalgia: Secondary | ICD-10-CM | POA: Diagnosis not present

## 2022-04-19 DIAGNOSIS — M5136 Other intervertebral disc degeneration, lumbar region: Secondary | ICD-10-CM | POA: Diagnosis not present

## 2022-04-23 ENCOUNTER — Ambulatory Visit: Payer: BC Managed Care – PPO | Admitting: Behavioral Health

## 2022-04-30 ENCOUNTER — Encounter: Payer: Self-pay | Admitting: Gastroenterology

## 2022-05-01 ENCOUNTER — Encounter: Payer: Self-pay | Admitting: Gastroenterology

## 2022-05-09 ENCOUNTER — Ambulatory Visit: Payer: BC Managed Care – PPO

## 2022-05-09 VITALS — BP 109/71 | HR 94 | Temp 98.4°F | Resp 18 | Ht 68.0 in | Wt 179.2 lb

## 2022-05-09 DIAGNOSIS — K5190 Ulcerative colitis, unspecified, without complications: Secondary | ICD-10-CM | POA: Diagnosis not present

## 2022-05-09 MED ORDER — VEDOLIZUMAB 300 MG IV SOLR
300.0000 mg | Freq: Once | INTRAVENOUS | Status: AC
  Administered 2022-05-09: 300 mg via INTRAVENOUS
  Filled 2022-05-09: qty 5

## 2022-05-09 NOTE — Progress Notes (Signed)
Diagnosis: Crohn's Disease  Provider:  Marshell Garfinkel MD  Procedure: Infusion  IV Type: Peripheral, IV Location: R Forearm  Entyvio (Vedolizumab), Dose: 300 mg  Infusion Start Time: 1110  Infusion Stop Time: [id number]  Post Infusion IV Care: Peripheral IV Discontinued  Discharge: Condition: Good, Destination: Home . AVS Provided  Performed by:  Cleophus Molt, RN

## 2022-05-15 ENCOUNTER — Other Ambulatory Visit: Payer: Self-pay | Admitting: Orthopedic Surgery

## 2022-05-15 ENCOUNTER — Other Ambulatory Visit: Payer: Self-pay | Admitting: *Deleted

## 2022-05-15 DIAGNOSIS — J4530 Mild persistent asthma, uncomplicated: Secondary | ICD-10-CM

## 2022-05-15 MED ORDER — ALBUTEROL SULFATE HFA 108 (90 BASE) MCG/ACT IN AERS: 2.0000 | INHALATION_SPRAY | Freq: Four times a day (QID) | RESPIRATORY_TRACT | 5 refills | Status: DC | PRN

## 2022-05-15 NOTE — Telephone Encounter (Signed)
Patient called requesting refill

## 2022-05-28 ENCOUNTER — Encounter: Payer: Self-pay | Admitting: Gastroenterology

## 2022-06-01 ENCOUNTER — Other Ambulatory Visit: Payer: BC Managed Care – PPO

## 2022-06-01 DIAGNOSIS — E1169 Type 2 diabetes mellitus with other specified complication: Secondary | ICD-10-CM | POA: Diagnosis not present

## 2022-06-02 LAB — HEMOGLOBIN A1C
Hgb A1c MFr Bld: 7 % of total Hgb — ABNORMAL HIGH (ref <5.7)
Mean Plasma Glucose: 154 mg/dL
eAG (mmol/L): 8.5 mmol/L

## 2022-06-18 ENCOUNTER — Other Ambulatory Visit: Payer: Self-pay | Admitting: Orthopedic Surgery

## 2022-06-18 ENCOUNTER — Other Ambulatory Visit: Payer: Self-pay

## 2022-06-18 DIAGNOSIS — I2510 Atherosclerotic heart disease of native coronary artery without angina pectoris: Secondary | ICD-10-CM

## 2022-06-18 MED ORDER — METOPROLOL SUCCINATE ER 50 MG PO TB24: 50.0000 mg | ORAL_TABLET | Freq: Every day | ORAL | 1 refills | Status: DC

## 2022-06-18 MED ORDER — PANTOPRAZOLE SODIUM 40 MG PO TBEC: DELAYED_RELEASE_TABLET | ORAL | 1 refills | Status: DC

## 2022-06-18 NOTE — Telephone Encounter (Signed)
Patient request refill on medications.

## 2022-06-19 NOTE — Progress Notes (Unsigned)
Cardiology Office Note   Date:  06/20/2022   ID:  [name], DOB 10-Apr-1960, MRN [id number]  PCP:  Harold Alanis, NP    No chief complaint on file.  CAD  Wt Readings from Last 3 Encounters:  06/20/22 175 lb 6.4 oz (79.6 kg)  05/09/22 179 lb 3.2 oz (81.3 kg)  04/11/22 176 lb 6.4 oz (80 kg)       History of Present Illness: [name] is a 62 y.o. male   who had an LAD stent in 2008.   His angina was an exertional chest tightness.  It occurred with mowing the lawn.  It would stop with rest.  Sx resolved after the stent.  Of note, he was seen at [facility] ER and had a negative w/u just before his cardiac w/u with me.    He has had some issues with fatty liver.  His atorvastatin was stopped due to increased LFTs.  He was started on Crestor in 4/19.   He has had some palpitations earlier in 2019.  Holter monitor at that time showed: Normal sinus rhythm with occasional PACs and PVCs. Short runs of SVT, less than 5 beats typically. No sustained arrhtyhmias.   Continue medical therapy.  If symptoms worsen, could increase beta blocker.    Crestor was stopped in July 2019 due to increased LFTs.    Had anterior MI in 9/21 with cath showing: " Critical stenosis of the mid LAD and patient with anterior STEMI, treated successfully with PCI of the mid LAD using a drug-eluting stent (2.5 x 18 mm resolute Onyx) overlapped with the old bare-metal stent which remained patent. 2.  Severe first diagonal stenosis treated successfully with a 3.0 x 15 mm resolute Onyx DES 3.  Diffuse distal vessel coronary artery disease involving the LAD and PDA 4.  Mild nonobstructive left mainstem and left circumflex stenosis"   Sister passed away in 01-29-2023 from lung cancer; she was a nonsmoker.   In 02/2021: "He goes to the gym regularly and has no cardiac symptoms. "  Noticed a floater in the left eye.   Denies : Chest pain. Dizziness. Leg edema. Nitroglycerin use. Orthopnea. Palpitations. Paroxysmal  nocturnal dyspnea. Shortness of breath. Syncope.    Goes to [employer].  Wife developed DM so they walk more now.  Does some weight machines.   No sx with activity.     Past Medical History:  Diagnosis Date   Anemia    Ankylosing spondylitis    Arthritis    Asthma    SINCE AGE 4   Colitis, ulcerative    Coronary artery disease    Bare metal LAD stent 10/08   Coronary atherosclerosis of native coronary artery 12/25/2012   ED (erectile dysfunction)    Elevated prostate specific antigen (PSA) 12/25/2012   Esophageal reflux 12/25/2012   Extrinsic asthma, unspecified 12/25/2012   Fibromyalgia    GERD (gastroesophageal reflux disease)    History of heart attack    Hypercalcemia 12/25/2012   Hyperlipidemia    Left sided ulcerative (chronic) colitis 12/25/2012   Mixed hyperlipidemia 12/25/2012   Pneumonia    as a child   Pure hypercholesterolemia 12/25/2012   Skin cancer of eyelid    TMJ (dislocation of temporomandibular joint)     Past Surgical History:  Procedure Laterality Date   COLONOSCOPY     CORONARY/GRAFT ACUTE MI REVASCULARIZATION N/A 11/23/2019   Procedure: Coronary/Graft Acute MI Revascularization;  Surgeon: Sherren Mocha, MD;  Location: [facility];  Service: Cardiovascular;  Laterality: N/A;   EYE SURGERY Bilateral    EYE SURGERY     heart stent     LEFT HEART CATH AND CORONARY ANGIOGRAPHY N/A 11/23/2019   Procedure: LEFT HEART CATH AND CORONARY ANGIOGRAPHY;  Surgeon: Sherren Mocha, MD;  Location: [facility];  Service: Cardiovascular;  Laterality: N/A;   skin cancer removed from left eyelid      skull fracture after falling down stairs     TONSILLECTOMY       Current Outpatient Medications  Medication Sig Dispense Refill   vedolizumab (ENTYVIO) 300 MG injection as directed Intravenous once month     albuterol (VENTOLIN HFA) 108 (90 Base) MCG/ACT inhaler Inhale 2 puffs into the lungs every 6 (six) hours as needed for wheezing. 6.7 g 5    aspirin 81 MG tablet Take 81 mg by mouth daily.      clopidogrel (PLAVIX) 75 MG tablet TAKE 1 TABLET(75 MG) BY MOUTH DAILY 90 tablet 0   cyclobenzaprine (FLEXERIL) 5 MG tablet Take 5 mg by mouth at bedtime.      dicyclomine (BENTYL) 20 MG tablet Take 1 tablet (20 mg total) by mouth 3 (three) times daily as needed for spasms. 40 tablet 0   Evolocumab (REPATHA SURECLICK) [id number] MG/ML SOAJ Inject 140 mg into the skin every 14 (fourteen) days. 2 mL 11   Ferrous Gluconate (IRON 27 PO) Take 1 tablet by mouth daily.     ferrous sulfate 325 (65 FE) MG EC tablet Take 325 mg by mouth daily with breakfast.      gabapentin (NEURONTIN) 100 MG capsule Take 200 mg by mouth at bedtime.     HYDROcodone-acetaminophen (NORCO) 10-325 MG per tablet Take 0.5-1 tablets by mouth every 6 (six) hours as needed for severe pain.   0   icosapent Ethyl (VASCEPA) 1 g capsule TAKE 2 CAPSULES BY MOUTH TWICE A DAY 120 capsule 0   loratadine (CLARITIN) 10 MG tablet Take 10 mg by mouth daily.     metFORMIN (GLUCOPHAGE-XR) 500 MG 24 hr tablet Take 1 tablet (500 mg total) by mouth 2 (two) times daily with a meal. 180 tablet 1   metoprolol succinate (TOPROL-XL) 50 MG 24 hr tablet Take 1 tablet (50 mg total) by mouth daily. Take with or immediately following a meal. 90 tablet 1   Multiple Vitamins-Minerals (CENTRUM CARDIO) TABS Take 1 tablet by mouth 2 (two) times daily.      nitroGLYCERIN (NITROSTAT) 0.4 MG SL tablet Place 1 tablet (0.4 mg total) under the tongue every 5 (five) minutes as needed for chest pain. 25 tablet 6   pantoprazole (PROTONIX) 40 MG tablet TAKE 1 TABLET (40 MG TOTAL) BY MOUTH DAILY. PLEASE SCHEDULE AN APPT. 90 tablet 1   predniSONE (DELTASONE) 1 MG tablet Take 1 mg by mouth daily. (Patient not taking: Reported on 06/20/2022)     sulfaSALAzine (AZULFIDINE) 500 MG EC tablet TAKE 2 TABLET BY MOUTH TWICE DAILY 120 tablet 3   theophylline (THEODUR) 300 MG 12 hr tablet Take 1 tablet (300 mg total) by mouth 2 (two) times  daily. 300 tablet 3   No current facility-administered medications for this visit.    Allergies:   Patient has no known allergies.    Social History:  The patient  reports that he has never smoked. He has quit using smokeless tobacco.  His smokeless tobacco use included snuff. He reports current alcohol use. He reports that he  does not use drugs.   Family History:  The patient's family history includes Brain cancer in his sister; COPD in his maternal grandfather; Diabetes in his maternal grandmother; Emphysema in his maternal grandfather; GER disease in his sister; Hypertension in his mother; Lung cancer in his father and sister.    ROS:  Please see the history of present illness.   Otherwise, review of systems are positive for visual issues.   All other systems are reviewed and negative.    PHYSICAL EXAM: VS:  BP 116/72   Pulse 96   Ht 5\' 8"  (1.727 m)   Wt 175 lb 6.4 oz (79.6 kg)   SpO2 95%   BMI 26.67 kg/m  , BMI Body mass index is 26.67 kg/m. GEN: Well nourished, well developed, in no acute distress HEENT: normal Neck: no JVD, carotid bruits, or masses Cardiac: RRR; no murmurs, rubs, or gallops,no edema  Respiratory:  clear to auscultation bilaterally, normal work of breathing GI: soft, nontender, nondistended, + BS MS: no deformity or atrophy Skin: warm and dry, no rash Neuro:  Strength and sensation are intact Psych: euthymic mood, full affect   EKG:   The ekg ordered today demonstrates NSR, no ST changes   Recent Labs: 02/21/2022: ALT 16; BUN 18; Creatinine, Ser 0.87; Hemoglobin 14.7; Platelets 283.0; Potassium 4.0; Sodium 137   Lipid Panel    Component Value Date/Time   CHOL 136 03/03/2021 1122   TRIG 184 (H) 03/03/2021 1122   HDL 41 03/03/2021 1122   CHOLHDL 3.3 03/03/2021 1122   CHOLHDL 4.4 11/24/2019 0217   VLDL 26 11/24/2019 0217   LDLCALC 64 03/03/2021 1122     Other studies Reviewed: Additional studies/ records that were reviewed today with results  demonstrating: labs reviewed.   ASSESSMENT AND PLAN:  CAD/Old MI: No angina.  Continue aggressive secondary prevention.   Hyperlipidemia: LDL 64 in 2022. COntinue repatha. DM: A1C 7.0.  HIgh fiber diet.  Avoid processed foods.  PLant based diet.  Metformin increased.  Overweight: He is increasing exercise and trying to improve diet.  He has lost a few pounds in the last few months. Elevated LFTs: normal in 12/23. Elevated PSA: has seen urology.   Current medicines are reviewed at length with the patient today.  The patient concerns regarding his medicines were addressed.  The following changes have been made:  No change  Labs/ tests ordered today include:  No orders of the defined types were placed in this encounter.   Recommend 150 minutes/week of aerobic exercise Low fat, low carb, high fiber diet recommended  Disposition:   FU in 1 year   Signed, Larae Grooms, MD  06/20/2022 11:45 AM    [facility] [place], [place], [place]  [address] Phone: [phone]; Fax: [phone]

## 2022-06-20 ENCOUNTER — Ambulatory Visit: Payer: BC Managed Care – PPO | Attending: Interventional Cardiology | Admitting: Interventional Cardiology

## 2022-06-20 ENCOUNTER — Encounter: Payer: Self-pay | Admitting: Interventional Cardiology

## 2022-06-20 VITALS — BP 116/72 | HR 96 | Ht 68.0 in | Wt 175.4 lb

## 2022-06-20 DIAGNOSIS — E782 Mixed hyperlipidemia: Secondary | ICD-10-CM | POA: Diagnosis not present

## 2022-06-20 DIAGNOSIS — E118 Type 2 diabetes mellitus with unspecified complications: Secondary | ICD-10-CM

## 2022-06-20 DIAGNOSIS — I25118 Atherosclerotic heart disease of native coronary artery with other forms of angina pectoris: Secondary | ICD-10-CM

## 2022-06-20 DIAGNOSIS — R972 Elevated prostate specific antigen [PSA]: Secondary | ICD-10-CM

## 2022-06-20 DIAGNOSIS — R7989 Other specified abnormal findings of blood chemistry: Secondary | ICD-10-CM

## 2022-06-20 NOTE — Patient Instructions (Signed)
Medication Instructions:  Your physician recommends that you continue on your current medications as directed. Please refer to the Current Medication list given to you today.  *If you need a refill on your cardiac medications before your next appointment, please call your pharmacy*   Lab Work: none If you have labs (blood work) drawn today and your tests are completely normal, you will receive your results only by: MyChart Message (if you have MyChart) OR A paper copy in the mail If you have any lab test that is abnormal or we need to change your treatment, we will call you to review the results.   Testing/Procedures: none   Follow-Up: At [facility], you and your health needs are our priority.  As part of our continuing mission to provide you with exceptional heart care, we have created designated Provider Care Teams.  These Care Teams include your primary Cardiologist (physician) and Advanced Practice Providers (APPs -  Physician Assistants and Nurse Practitioners) who all work together to provide you with the care you need, when you need it.  We recommend signing up for the patient portal called "MyChart".  Sign up information is provided on this After Visit Summary.  MyChart is used to connect with patients for Virtual Visits (Telemedicine).  Patients are able to view lab/test results, encounter notes, upcoming appointments, etc.  Non-urgent messages can be sent to your provider as well.   To learn more about what you can do with MyChart, go to [email or web address].    Your next appointment:   12 month(s)  Provider:   Jayadeep Varanasi, MD     Other Instructions  High-Fiber Eating Plan Fiber, also called dietary fiber, is a type of carbohydrate. It is found foods such as fruits, vegetables, whole grains, and beans. A high-fiber diet can have many health benefits. Your health care provider may recommend a high-fiber diet to help: Prevent constipation. Fiber can make  your bowel movements more regular. Lower your cholesterol. Relieve the following conditions: Inflammation of veins in the anus (hemorrhoids). Inflammation of specific areas of the digestive tract (uncomplicated diverticulosis). A problem of the large intestine, also called the colon, that sometimes causes pain and diarrhea (irritable bowel syndrome, or IBS). Prevent overeating as part of a weight-loss plan. Prevent heart disease, type 2 diabetes, and certain cancers. What are tips for following this plan? Reading food labels  Check the nutrition facts label on food products for the amount of dietary fiber. Choose foods that have 5 grams of fiber or more per serving. The goals for recommended daily fiber intake include: Men (age 50 or younger): 34-38 g. Men (over age 50): 28-34 g. Women (age 50 or younger): 25-28 g. Women (over age 50): 22-25 g. Your daily fiber goal is _____________ g. Shopping Choose whole fruits and vegetables instead of processed forms, such as apple juice or applesauce. Choose a wide variety of high-fiber foods such as avocados, lentils, oats, and kidney beans. Read the nutrition facts label of the foods you choose. Be aware of foods with added fiber. These foods often have high sugar and sodium amounts per serving. Cooking Use whole-grain flour for baking and cooking. Cook with brown rice instead of white rice. Meal planning Start the day with a breakfast that is high in fiber, such as a cereal that contains 5 g of fiber or more per serving. Eat breads and cereals that are made with whole-grain flour instead of refined flour or white flour. Eat brown rice, bulgur wheat,   or millet instead of white rice. Use beans in place of meat in soups, salads, and pasta dishes. Be sure that half of the grains you eat each day are whole grains. General information You can get the recommended daily intake of dietary fiber by: Eating a variety of fruits, vegetables, grains,  nuts, and beans. Taking a fiber supplement if you are not able to take in enough fiber in your diet. It is better to get fiber through food than from a supplement. Gradually increase how much fiber you consume. If you increase your intake of dietary fiber too quickly, you may have bloating, cramping, or gas. Drink plenty of water to help you digest fiber. Choose high-fiber snacks, such as berries, raw vegetables, nuts, and popcorn. What foods should I eat? Fruits Berries. Pears. Apples. Oranges. Avocado. Prunes and raisins. Dried figs. Vegetables Sweet potatoes. Spinach. Kale. Artichokes. Cabbage. Broccoli. Cauliflower. Green peas. Carrots. Squash. Grains Whole-grain breads. Multigrain cereal. Oats and oatmeal. Brown rice. Barley. Bulgur wheat. Millet. Quinoa. Bran muffins. Popcorn. Rye wafer crackers. Meats and other proteins Navy beans, kidney beans, and pinto beans. Soybeans. Split peas. Lentils. Nuts and seeds. Dairy Fiber-fortified yogurt. Beverages Fiber-fortified soy milk. Fiber-fortified orange juice. Other foods Fiber bars. The items listed above may not be a complete list of recommended foods and beverages. Contact a dietitian for more information. What foods should I avoid? Fruits Fruit juice. Cooked, strained fruit. Vegetables Fried potatoes. Canned vegetables. Well-cooked vegetables. Grains White bread. Pasta made with refined flour. White rice. Meats and other proteins Fatty cuts of meat. Fried chicken or fried fish. Dairy Milk. Yogurt. Cream cheese. Sour cream. Fats and oils Butters. Beverages Soft drinks. Other foods Cakes and pastries. The items listed above may not be a complete list of foods and beverages to avoid. Talk with your dietitian about what choices are best for you. Summary Fiber is a type of carbohydrate. It is found in foods such as fruits, vegetables, whole grains, and beans. A high-fiber diet has many benefits. It can help to prevent  constipation, lower blood cholesterol, aid weight loss, and reduce your risk of heart disease, diabetes, and certain cancers. Increase your intake of fiber gradually. Increasing fiber too quickly may cause cramping, bloating, and gas. Drink plenty of water while you increase the amount of fiber you consume. The best sources of fiber include whole fruits and vegetables, whole grains, nuts, seeds, and beans. This information is not intended to replace advice given to you by your health care provider. Make sure you discuss any questions you have with your health care provider. Document Revised: 07/09/2019 Document Reviewed: 07/09/2019 Elsevier Patient Education  2023 Elsevier Inc.   

## 2022-07-02 ENCOUNTER — Telehealth: Payer: Self-pay | Admitting: Pharmacist

## 2022-07-02 DIAGNOSIS — H9313 Tinnitus, bilateral: Secondary | ICD-10-CM | POA: Diagnosis not present

## 2022-07-02 DIAGNOSIS — H903 Sensorineural hearing loss, bilateral: Secondary | ICD-10-CM | POA: Diagnosis not present

## 2022-07-02 NOTE — Telephone Encounter (Addendum)
Pt left message that prior authorization is needed for his Vascepa. He is aware our office will work on this. He would like a call back once it's approved.

## 2022-07-03 ENCOUNTER — Encounter: Payer: Self-pay | Admitting: Gastroenterology

## 2022-07-03 ENCOUNTER — Telehealth: Payer: Self-pay

## 2022-07-03 ENCOUNTER — Other Ambulatory Visit (HOSPITAL_COMMUNITY): Payer: Self-pay

## 2022-07-03 MED ORDER — ICOSAPENT ETHYL 1 G PO CAPS: 2.0000 g | ORAL_CAPSULE | Freq: Two times a day (BID) | ORAL | 1 refills | Status: DC

## 2022-07-03 NOTE — Telephone Encounter (Signed)
Pharmacy Patient Advocate Encounter  Prior Authorization for VASCEPA has been approved.    Effective dates: 06/03/22 through 07/03/23   Received notification from [facility] that prior authorization for VASCEPA is needed.    PA submitted on 07/03/22 Key [id number] Status is pending  Haze Rushing, CPhT Pharmacy Patient Advocate Specialist Direct Number: [phone] Fax: [phone]

## 2022-07-03 NOTE — Telephone Encounter (Signed)
PA approved, Rx sent to [facility]

## 2022-07-04 ENCOUNTER — Ambulatory Visit (INDEPENDENT_AMBULATORY_CARE_PROVIDER_SITE_OTHER): Payer: BC Managed Care – PPO

## 2022-07-04 VITALS — BP 116/77 | HR 85 | Temp 98.1°F | Resp 16 | Ht 68.0 in | Wt 173.8 lb

## 2022-07-04 DIAGNOSIS — K5190 Ulcerative colitis, unspecified, without complications: Secondary | ICD-10-CM | POA: Diagnosis not present

## 2022-07-04 MED ORDER — VEDOLIZUMAB 300 MG IV SOLR
300.0000 mg | Freq: Once | INTRAVENOUS | Status: AC
  Administered 2022-07-04: 300 mg via INTRAVENOUS
  Filled 2022-07-04: qty 5

## 2022-07-04 NOTE — Progress Notes (Signed)
Diagnosis: Crohn's Disease  Provider:  Chilton Greathouse MD  Procedure: Infusion  IV Type: Peripheral, IV Location: L Forearm  Evinity (Romosozumab-aqqg), Dose: 300 mg  Infusion Start Time: 1110  Infusion Stop Time: 1150  Post Infusion IV Care: Patient declined observation  Discharge: Condition: Good, Destination: Home . AVS Declined  Performed by:  Loney Hering, LPN

## 2022-08-05 ENCOUNTER — Other Ambulatory Visit: Payer: Self-pay | Admitting: Interventional Cardiology

## 2022-08-05 ENCOUNTER — Other Ambulatory Visit: Payer: Self-pay | Admitting: Gastroenterology

## 2022-08-05 DIAGNOSIS — K5180 Other ulcerative colitis without complications: Secondary | ICD-10-CM

## 2022-08-14 DIAGNOSIS — Z961 Presence of intraocular lens: Secondary | ICD-10-CM | POA: Diagnosis not present

## 2022-08-14 DIAGNOSIS — H26492 Other secondary cataract, left eye: Secondary | ICD-10-CM | POA: Diagnosis not present

## 2022-08-16 ENCOUNTER — Other Ambulatory Visit (HOSPITAL_COMMUNITY): Payer: Self-pay

## 2022-08-16 ENCOUNTER — Encounter: Payer: Self-pay | Admitting: Gastroenterology

## 2022-08-23 ENCOUNTER — Other Ambulatory Visit: Payer: Self-pay | Admitting: Orthopedic Surgery

## 2022-08-23 DIAGNOSIS — J4530 Mild persistent asthma, uncomplicated: Secondary | ICD-10-CM

## 2022-08-28 ENCOUNTER — Telehealth: Payer: Self-pay | Admitting: *Deleted

## 2022-08-28 DIAGNOSIS — K5190 Ulcerative colitis, unspecified, without complications: Secondary | ICD-10-CM

## 2022-08-28 NOTE — Telephone Encounter (Signed)
===  View-only below this line=== ----- Message ----- From: Imogene Burn, MD Sent: 08/28/2022  11:16 AM EDT To: Richardson Chiquito, CMA  Yes okay with me

## 2022-08-28 NOTE — Telephone Encounter (Signed)
Left message for patient to call back  

## 2022-08-28 NOTE — Telephone Encounter (Signed)
-----   Message from Lucky Cowboy, RN sent at 02/23/2022  2:28 PM EST ----- Patient needs CBC 6 month follow up. Refer to result note 02/21/22. Order needs to be placed.

## 2022-08-28 NOTE — Telephone Encounter (Signed)
Dr Leonides Schanz (DOD)-  Previous Dr Orvan Falconer patient with recommendations to have follow up CBC at this time (see 02/21/22 lab result note).   Okay to order under you?  Dottie

## 2022-08-29 ENCOUNTER — Ambulatory Visit (INDEPENDENT_AMBULATORY_CARE_PROVIDER_SITE_OTHER): Payer: BC Managed Care – PPO

## 2022-08-29 ENCOUNTER — Other Ambulatory Visit (INDEPENDENT_AMBULATORY_CARE_PROVIDER_SITE_OTHER): Payer: BC Managed Care – PPO

## 2022-08-29 VITALS — BP 111/74 | HR 90 | Temp 98.6°F | Resp 16 | Ht 68.0 in | Wt 173.8 lb

## 2022-08-29 DIAGNOSIS — K5190 Ulcerative colitis, unspecified, without complications: Secondary | ICD-10-CM | POA: Diagnosis not present

## 2022-08-29 LAB — CBC WITH DIFFERENTIAL/PLATELET
Basophils Absolute: 0.2 10*3/uL — ABNORMAL HIGH (ref 0.0–0.1)
Basophils Relative: 2.6 % (ref 0.0–3.0)
Eosinophils Absolute: 0.9 10*3/uL — ABNORMAL HIGH (ref 0.0–0.7)
Eosinophils Relative: 13.8 % — ABNORMAL HIGH (ref 0.0–5.0)
HCT: 42.7 % (ref 39.0–52.0)
Hemoglobin: 14.2 g/dL (ref 13.0–17.0)
Lymphocytes Relative: 27.7 % (ref 12.0–46.0)
Lymphs Abs: 1.9 10*3/uL (ref 0.7–4.0)
MCHC: 33.1 g/dL (ref 30.0–36.0)
MCV: 93.4 fl (ref 78.0–100.0)
Monocytes Absolute: 0.4 10*3/uL (ref 0.1–1.0)
Monocytes Relative: 6.7 % (ref 3.0–12.0)
Neutro Abs: 3.3 10*3/uL (ref 1.4–7.7)
Neutrophils Relative %: 49.2 % (ref 43.0–77.0)
Platelets: 289 10*3/uL (ref 150.0–400.0)
RBC: 4.58 Mil/uL (ref 4.22–5.81)
RDW: 12.6 % (ref 11.5–15.5)
WBC: 6.7 10*3/uL (ref 4.0–10.5)

## 2022-08-29 MED ORDER — VEDOLIZUMAB 300 MG IV SOLR
300.0000 mg | Freq: Once | INTRAVENOUS | Status: AC
  Administered 2022-08-29: 300 mg via INTRAVENOUS
  Filled 2022-08-29: qty 5

## 2022-08-29 NOTE — Telephone Encounter (Signed)
Spoke to patient and advised he should come for follow up labs this week or next. Patient verbalizes understanding and states he will do this.

## 2022-08-29 NOTE — Progress Notes (Signed)
Diagnosis: Crohn's Disease  Provider:  Chilton Greathouse MD  Procedure: IV Infusion  IV Type: Peripheral, IV Location: R Hand  Entyvio (Vedolizumab), Dose: 300 mg  Infusion Start Time: 1100  Infusion Stop Time: 1132  Post Infusion IV Care: Peripheral IV Discontinued  Discharge: Condition: Good, Destination: Home . AVS Declined  Performed by:  Loney Hering, LPN

## 2022-09-06 ENCOUNTER — Ambulatory Visit (INDEPENDENT_AMBULATORY_CARE_PROVIDER_SITE_OTHER): Payer: BC Managed Care – PPO | Admitting: Orthopedic Surgery

## 2022-09-06 ENCOUNTER — Encounter: Payer: Self-pay | Admitting: Orthopedic Surgery

## 2022-09-06 VITALS — BP 114/74 | HR 91 | Temp 97.4°F | Resp 16 | Ht 68.0 in | Wt 175.4 lb

## 2022-09-06 DIAGNOSIS — K5180 Other ulcerative colitis without complications: Secondary | ICD-10-CM

## 2022-09-06 DIAGNOSIS — E1142 Type 2 diabetes mellitus with diabetic polyneuropathy: Secondary | ICD-10-CM

## 2022-09-06 DIAGNOSIS — E7800 Pure hypercholesterolemia, unspecified: Secondary | ICD-10-CM

## 2022-09-06 DIAGNOSIS — M450 Ankylosing spondylitis of multiple sites in spine: Secondary | ICD-10-CM

## 2022-09-06 DIAGNOSIS — K5150 Left sided colitis without complications: Secondary | ICD-10-CM

## 2022-09-06 LAB — BASIC METABOLIC PANEL WITH GFR
BUN: 20 mg/dL (ref 7–25)
CO2: 27 mmol/L (ref 20–32)
Calcium: 10.6 mg/dL — ABNORMAL HIGH (ref 8.6–10.3)
Chloride: 104 mmol/L (ref 98–110)
Creat: 0.92 mg/dL (ref 0.70–1.35)
Glucose, Bld: 141 mg/dL — ABNORMAL HIGH (ref 65–99)
Potassium: 4.7 mmol/L (ref 3.5–5.3)
Sodium: 141 mmol/L (ref 135–146)
eGFR: 95 mL/min/{1.73_m2} (ref >=60)

## 2022-09-06 LAB — HEMOGLOBIN A1C
Hgb A1c MFr Bld: 6.5 % of total Hgb — ABNORMAL HIGH (ref <5.7)
Mean Plasma Glucose: 140 mg/dL
eAG (mmol/L): 7.7 mmol/L

## 2022-09-06 MED ORDER — CYCLOBENZAPRINE HCL 5 MG PO TABS
5.0000 mg | ORAL_TABLET | Freq: Every day | ORAL | 1 refills | Status: AC
Start: 2022-09-06 — End: ?

## 2022-09-06 NOTE — Progress Notes (Signed)
Careteam: Patient Care Team: Octavia Heir, NP as PCP - General (Adult Health Nurse Practitioner) Corky Crafts, MD as PCP - Cardiology (Cardiology)  Seen by: Hazle Nordmann, AGNP-C  PLACE OF SERVICE:  [facility]  Advanced Directive information Does Patient Have a Medical Advance Directive?: No, Would patient like information on creating a medical advance directive?: No - Patient declined  No Known Allergies  Chief Complaint  Patient presents with   Medical Management of Chronic Issues    3 month follow up.    Health Maintenance    Discuss the need for Hemoglobin A1C, and Foot exam.    Immunizations    Discuss the need for Shingrix vaccine, DTAP vaccine, and Covid Booster.     HPI: Patient is a 62 y.o. male seen today for medical management of chronic conditions.   A1c 8.8 (03/2022)> dropped to 7.0 (05/2022). Taking metformin as prescribed. Foot exam done today. Eye exam 10/31/2021.   H/o colonoscopy 07/13/2021> showed chronic UC to right and left colon. No changes with Ulcerative colitis. Followed by GI. He is now doing monthly infusion of vedolizumab. Reports some improvement.   HLD- remains on Repatha  Discussed Shingrix and Tdap vaccines.    Review of Systems:  Review of Systems  Constitutional: Negative.   HENT:  Positive for hearing loss.   Eyes: Negative.   Respiratory:  Negative for cough, shortness of breath and wheezing.   Cardiovascular:  Negative for chest pain and leg swelling.  Gastrointestinal:  Positive for constipation and diarrhea. Negative for abdominal pain.  Genitourinary: Negative.   Musculoskeletal:  Positive for back pain.  Skin: Negative.   Neurological: Negative.   Endo/Heme/Allergies: Negative.   Psychiatric/Behavioral:  Positive for depression.     Past Medical History:  Diagnosis Date   Anemia    Ankylosing spondylitis (HCC)    Arthritis    Asthma    SINCE AGE 29   Colitis, ulcerative (HCC)    Coronary artery disease     Bare metal LAD stent 10/08   Coronary atherosclerosis of native coronary artery 12/25/2012   ED (erectile dysfunction)    Elevated prostate specific antigen (PSA) 12/25/2012   Esophageal reflux 12/25/2012   Extrinsic asthma, unspecified 12/25/2012   Fibromyalgia    GERD (gastroesophageal reflux disease)    History of heart attack    Hypercalcemia 12/25/2012   Hyperlipidemia    Left sided ulcerative (chronic) colitis (HCC) 12/25/2012   Mixed hyperlipidemia 12/25/2012   Pneumonia    as a child   Pure hypercholesterolemia 12/25/2012   Skin cancer of eyelid    TMJ (dislocation of temporomandibular joint)    Past Surgical History:  Procedure Laterality Date   COLONOSCOPY     CORONARY/GRAFT ACUTE MI REVASCULARIZATION N/A 11/23/2019   Procedure: Coronary/Graft Acute MI Revascularization;  Surgeon: Tonny Bollman, MD;  Location: [facility] INVASIVE CV LAB;  Service: Cardiovascular;  Laterality: N/A;   EYE SURGERY Bilateral    EYE SURGERY     heart stent     LEFT HEART CATH AND CORONARY ANGIOGRAPHY N/A 11/23/2019   Procedure: LEFT HEART CATH AND CORONARY ANGIOGRAPHY;  Surgeon: Tonny Bollman, MD;  Location: [facility] INVASIVE CV LAB;  Service: Cardiovascular;  Laterality: N/A;   skin cancer removed from left eyelid      skull fracture after falling down stairs     TONSILLECTOMY     Social History:   reports that he has never smoked. He has quit using smokeless tobacco.  His  smokeless tobacco use included snuff. He reports current alcohol use. He reports that he does not use drugs.  Family History  Problem Relation Age of Onset   Hypertension Mother    Lung cancer Father    Lung cancer Sister    GER disease Sister    Brain cancer Sister    Diabetes Maternal Grandmother    Emphysema Maternal Grandfather    COPD Maternal Grandfather    Pancreatic cancer Neg Hx    Stomach cancer Neg Hx    Colon cancer Neg Hx    Esophageal cancer Neg Hx    Colon polyps Neg Hx    Heart disease Neg Hx      Medications: Patient's Medications  New Prescriptions   No medications on file  Previous Medications   ALBUTEROL (VENTOLIN HFA) 108 (90 BASE) MCG/ACT INHALER    INHALE 2 PUFFS INTO THE LUNGS EVERY 6 HOURS AS NEEDED FOR WHEEZING   ASPIRIN 81 MG TABLET    Take 81 mg by mouth daily.    CLOPIDOGREL (PLAVIX) 75 MG TABLET    TAKE 1 TABLET(75 MG) BY MOUTH DAILY   CYCLOBENZAPRINE (FLEXERIL) 5 MG TABLET    Take 5 mg by mouth at bedtime.    DICYCLOMINE (BENTYL) 20 MG TABLET    Take 1 tablet (20 mg total) by mouth 3 (three) times daily as needed for spasms.   EVOLOCUMAB (REPATHA SURECLICK) 140 MG/ML SOAJ    Inject 140 mg into the skin every 14 (fourteen) days.   FERROUS GLUCONATE (IRON 27 PO)    Take 1 tablet by mouth daily.   FERROUS SULFATE 325 (65 FE) MG EC TABLET    Take 325 mg by mouth daily with breakfast.    GABAPENTIN (NEURONTIN) 100 MG CAPSULE    Take 200 mg by mouth at bedtime.   HYDROCODONE-ACETAMINOPHEN (NORCO) 10-325 MG PER TABLET    Take 0.5-1 tablets by mouth every 6 (six) hours as needed for severe pain.    ICOSAPENT ETHYL (VASCEPA) 1 G CAPSULE    TAKE 2 CAPSULES(2 GRAMS) BY MOUTH TWICE DAILY   LORATADINE (CLARITIN) 10 MG TABLET    Take 10 mg by mouth daily.   METFORMIN (GLUCOPHAGE-XR) 500 MG 24 HR TABLET    Take 1 tablet (500 mg total) by mouth 2 (two) times daily with a meal.   METOPROLOL SUCCINATE (TOPROL-XL) 50 MG 24 HR TABLET    Take 1 tablet (50 mg total) by mouth daily. Take with or immediately following a meal.   MULTIPLE VITAMINS-MINERALS (CENTRUM CARDIO) TABS    Take 1 tablet by mouth 2 (two) times daily.    NITROGLYCERIN (NITROSTAT) 0.4 MG SL TABLET    Place 1 tablet (0.4 mg total) under the tongue every 5 (five) minutes as needed for chest pain.   PANTOPRAZOLE (PROTONIX) 40 MG TABLET    TAKE 1 TABLET (40 MG TOTAL) BY MOUTH DAILY. PLEASE SCHEDULE AN APPT.   SULFASALAZINE (AZULFIDINE) 500 MG EC TABLET    TAKE 2 TABLETS BY MOUTH TWICE DAILY   THEOPHYLLINE (THEODUR) 300 MG  12 HR TABLET    Take 1 tablet (300 mg total) by mouth 2 (two) times daily.   VEDOLIZUMAB (ENTYVIO) 300 MG INJECTION    as directed Intravenous once month  Modified Medications   No medications on file  Discontinued Medications   PREDNISONE (DELTASONE) 1 MG TABLET    Take 1 mg by mouth daily.    Physical Exam:  Vitals:   09/06/22 1335  BP: 114/74  Pulse: (!) 104  Resp: 16  Temp: (!) 97.4 F (36.3 C)  SpO2: 95%  Weight: 175 lb 6.4 oz (79.6 kg)  Height: 5\' 8"  (1.727 m)   Body mass index is 26.67 kg/m. Wt Readings from Last 3 Encounters:  09/06/22 175 lb 6.4 oz (79.6 kg)  08/29/22 173 lb 12.8 oz (78.8 kg)  07/04/22 173 lb 12.8 oz (78.8 kg)    Physical Exam Vitals reviewed.  Constitutional:      General: He is not in acute distress. HENT:     Head: Normocephalic.  Eyes:     General:        Right eye: No discharge.        Left eye: No discharge.  Cardiovascular:     Rate and Rhythm: Normal rate and regular rhythm.     Pulses: Normal pulses.     Heart sounds: Normal heart sounds.  Pulmonary:     Effort: Pulmonary effort is normal. No respiratory distress.     Breath sounds: Normal breath sounds. No wheezing.  Abdominal:     General: Bowel sounds are normal. There is no distension.     Palpations: Abdomen is soft.     Tenderness: There is no abdominal tenderness.  Musculoskeletal:     Cervical back: Neck supple.     Right lower leg: No edema.     Left lower leg: No edema.  Skin:    General: Skin is warm.     Capillary Refill: Capillary refill takes less than 2 seconds.  Neurological:     General: No focal deficit present.     Mental Status: He is alert and oriented to person, place, and time.  Psychiatric:        Mood and Affect: Mood normal.        Behavior: Behavior normal.     Labs reviewed: Basic Metabolic Panel: Recent Labs    02/21/22 1101  NA 137  K 4.0  CL 100  CO2 29  GLUCOSE 285*  BUN 18  CREATININE 0.87  CALCIUM 9.9   Liver Function  Tests: Recent Labs    02/21/22 1101  AST 12  ALT 16  ALKPHOS 78  BILITOT 0.5  PROT 7.6  ALBUMIN 4.4   No results for input(s): "LIPASE", "AMYLASE" in the last 8760 hours. No results for input(s): "AMMONIA" in the last 8760 hours. CBC: Recent Labs    02/21/22 1101 08/29/22 1150  WBC 7.3 6.7  NEUTROABS 3.2 3.3  HGB 14.7 14.2  HCT 43.0 42.7  MCV 92.0 93.4  PLT 283.0 289.0   Lipid Panel: No results for input(s): "CHOL", "HDL", "LDLCALC", "TRIG", "CHOLHDL", "LDLDIRECT" in the last 8760 hours. TSH: No results for input(s): "TSH" in the last 8760 hours. A1C: Lab Results  Component Value Date   HGBA1C 7.0 (H) 06/01/2022     Assessment/Plan 1. Type 2 diabetes mellitus with diabetic polyneuropathy, without long-term current use of insulin (HCC) - A1c 7.0 (03/15) - recheck today - foot exam today> unremarkable - eye exam 10/2021 - cont metformin  - Hemoglobin A1c - Basic Metabolic Panel with eGFR  2. Ankylosing spondylitis of multiple sites in spine (HCC) - cont gabapentin - cyclobenzaprine (FLEXERIL) 5 MG tablet; Take 1 tablet (5 mg total) by mouth at bedtime.  Dispense: 90 tablet; Refill: 1  3. Left sided ulcerative (chronic) colitis (HCC) - 06/2021 confirmed by colonoscopy - followed by GI - on vedolizumab infusions monthly> some improvement  4. Right sided  ulcerative colitis (HCC) - see above  5. Pure hypercholesterolemia - followed by cardiology - cont Repatha  Total time: 32 minutes. Greater than 50% of total time spent doing patient education regarding health maintenance, T2DM, UC, and back pain including symptom/medication management.     Next appt: Visit date not found  Clarke Peretz Scherry Ran  [facility] [phone]

## 2022-09-06 NOTE — Patient Instructions (Addendum)
Think about getting Shingles vaccine at local pharmacy  Consider getting tdap (tetanus)   Try dial antibacterial soap to reduce bumps

## 2022-09-15 ENCOUNTER — Other Ambulatory Visit: Payer: Self-pay | Admitting: Orthopedic Surgery

## 2022-09-15 DIAGNOSIS — I2510 Atherosclerotic heart disease of native coronary artery without angina pectoris: Secondary | ICD-10-CM

## 2022-10-08 ENCOUNTER — Encounter: Payer: Self-pay | Admitting: Internal Medicine

## 2022-10-08 ENCOUNTER — Ambulatory Visit (INDEPENDENT_AMBULATORY_CARE_PROVIDER_SITE_OTHER): Payer: BC Managed Care – PPO | Admitting: Internal Medicine

## 2022-10-08 VITALS — BP 108/70 | HR 100 | Ht 68.0 in | Wt 175.0 lb

## 2022-10-08 DIAGNOSIS — T380X5A Adverse effect of glucocorticoids and synthetic analogues, initial encounter: Secondary | ICD-10-CM

## 2022-10-08 DIAGNOSIS — K5190 Ulcerative colitis, unspecified, without complications: Secondary | ICD-10-CM | POA: Diagnosis not present

## 2022-10-08 DIAGNOSIS — D84821 Immunodeficiency due to drugs: Secondary | ICD-10-CM | POA: Diagnosis not present

## 2022-10-08 DIAGNOSIS — Z23 Encounter for immunization: Secondary | ICD-10-CM | POA: Diagnosis not present

## 2022-10-08 DIAGNOSIS — Z7952 Long term (current) use of systemic steroids: Secondary | ICD-10-CM

## 2022-10-08 NOTE — Patient Instructions (Addendum)
Your provider has requested that you go to the basement level for lab work before leaving today. Press "B" on the elevator. The lab is located at the first door on the left as you exit the elevator.   Follow up in 6 months  If your blood pressure at your visit was 140/90 or greater, please contact your primary care physician to follow up on this.  _______________________________________________________  If you are age 62 or older, your body mass index should be between 23-30. Your Body mass index is 26.61 kg/m. If this is out of the aforementioned range listed, please consider follow up with your Primary Care Provider.  If you are age 86 or younger, your body mass index should be between 19-25. Your Body mass index is 26.61 kg/m. If this is out of the aformentioned range listed, please consider follow up with your Primary Care Provider.    The [place] [facility] providers would like to encourage you to use [facility] to communicate with providers for non-urgent requests or questions.  Due to long hold times on the telephone, sending your provider a message by [facility] may be a faster and more efficient way to get a response.  Please allow 48 business hours for a response.  Please remember that this is for non-urgent requests.   Due to recent changes in healthcare laws, you may see the results of your imaging and laboratory studies on MyChart before your provider has had a chance to review them.  We understand that in some cases there may be results that are confusing or concerning to you. Not all laboratory results come back in the same time frame and the provider may be waiting for multiple results in order to interpret others.  Please give [place] 48 hours in order for your provider to thoroughly review all the results before contacting the office for clarification of your results.    Thank you for entrusting me with your care and for choosing [facility],  Dr. Eulah Pont

## 2022-10-08 NOTE — Progress Notes (Signed)
Chief complaint:  Ulcerative colitis   IMPRESSION:  Pancolonic ulcerative colitis. Last colonoscopy 07/13/2021 showed chronic ulcerative colitis to the right and left colon. Seems to have some improvement on the Entyvio ever he was started on infusions in 03/2022. He is still on sulfasalazine, which seems to help with joint pains. Will check his Entyvio level and antibody before his next infusion. His symptoms are slightly improved, but his most recent CBC does still show some elevated eosinophils. I did offer him a colonoscopy to be able to check his disease activity on the [facility], but the patient declined at this time and would prefer to do a colonoscopy next year if possible.   Chronic prednisone use.    Hyperplastic polyp removed from the ascending colon per colonoscopy 07/13/2021.    GERD Continue Pantoprazole 40mg  QD   Ankylosing spondylitis of multiple sites in spine. Previously treated with Humira and Remicade without noticeable improvement with UC symptoms.  Continue follow-up with Dr. Dierdre Forth.  PLAN: - Will plan to check Entyvio level and antibody, quantiferon gold, vitamin B12, vitamin D, folate, hepatitis A antibody, hepatitis B surface antibody. I asked the patient to come a day before his next Entyvio infusion to drop off the labs. - Continue Sulfasalazine 1000mg  po bid - Cont Entyvio infusions - Will give pneumonia vaccine PCV20. Patient previously received PPSV23 - Patient plans to get Shingles vaccine - Declined colonoscopy for now. He would like to wait until next year.  - Will need to establish with a dermatologist in the future for annual skin checks - RTC 6 months   HPI: [name] is a 62 y.o. male with history of coronary artery disease s/p stent to the LAD 2008 s/p STEMI s/p DES x two 11/2019 on ASA and Plavix,  DM II, GERD, ankylosing spondylitis and ulcerative colitis presents for follow up of ulcerative colitis  Colonoscopy 07/13/2021 which showed mild  ulcerative colitis Mayo score 1, diverticulosis to the sigmoid and descending colon and a 3-4 hyperplastic polyp in the ascending colon. Ascending colon with chronic colitis moderately active phase, further colon biopsies showed chronic ulcerative colitis throughout the colon at 90 - 40cm, nonspecific inflammation at 30 - 20 cm without evidence of active IBD then chronic ulcerative colitis at 10cm and no evidence of IBD to the rectum.   Interval History: He was started on the Entyvio medication in 03/2022. He initially felt really good, but then his progress has plateaued. He still feels better than before he started the [facility] treatments. It usually takes him 2 hours to have a BM in the morning. He feels less fecal urgency on the Entyvio. Denies blood in his stools. He has 4-5 BMs per day, which is typical for him. The Entyvio has decreased the frequency of his stools slightly. Denies nocturnal stools. Last Entyvio infusion was 08/29/22. His next Entyvio infusion is on 8/7. He has mid-back pain due to his ankylosing spondylitis. He sees his rheumatologist Dr. Dierdre Forth twice a year. He is still on the sulfasalazine, which he does think helps with his pains. He had cataract surgery last year that helped with his vision. He had some bug bites on his skin that resolved. He does not yet follow with a dermatologist  IBD history:  Surgical history: None  Current medications: Entyvio (03/2022-), sulfasalazine 1000 mg BID Prior medications: Previously on Humira and Remicade for AS but he didn't think this helped his colitis. Was on prednisone for years, but this was weaned off  after he was started on Entyvio Last colonoscopy: 07/13/2021 Extraintestinal manifestations: AS followed by Dr. Dierdre Forth   Past Medical History:  Diagnosis Date   Anemia    Ankylosing spondylitis (HCC)    Arthritis    Asthma    SINCE AGE 79   Colitis, ulcerative (HCC)    Coronary artery disease    Bare metal LAD stent 10/08   Coronary  atherosclerosis of native coronary artery 12/25/2012   ED (erectile dysfunction)    Elevated prostate specific antigen (PSA) 12/25/2012   Esophageal reflux 12/25/2012   Extrinsic asthma, unspecified 12/25/2012   Fibromyalgia    GERD (gastroesophageal reflux disease)    History of heart attack    Hypercalcemia 12/25/2012   Hyperlipidemia    Left sided ulcerative (chronic) colitis (HCC) 12/25/2012   Mixed hyperlipidemia 12/25/2012   Pneumonia    as a child   Pure hypercholesterolemia 12/25/2012   Skin cancer of eyelid    TMJ (dislocation of temporomandibular joint)     Past Surgical History:  Procedure Laterality Date   COLONOSCOPY     CORONARY/GRAFT ACUTE MI REVASCULARIZATION N/A 11/23/2019   Procedure: Coronary/Graft Acute MI Revascularization;  Surgeon: Tonny Bollman, MD;  Location: [facility] INVASIVE CV LAB;  Service: Cardiovascular;  Laterality: N/A;   EYE SURGERY Bilateral    EYE SURGERY     heart stent     LEFT HEART CATH AND CORONARY ANGIOGRAPHY N/A 11/23/2019   Procedure: LEFT HEART CATH AND CORONARY ANGIOGRAPHY;  Surgeon: Tonny Bollman, MD;  Location: [facility] INVASIVE CV LAB;  Service: Cardiovascular;  Laterality: N/A;   skin cancer removed from left eyelid      skull fracture after falling down stairs     TONSILLECTOMY      Current Outpatient Medications  Medication Sig Dispense Refill   albuterol (VENTOLIN HFA) 108 (90 Base) MCG/ACT inhaler INHALE 2 PUFFS INTO THE LUNGS EVERY 6 HOURS AS NEEDED FOR WHEEZING 6.7 g 5   aspirin 81 MG tablet Take 81 mg by mouth daily.      clopidogrel (PLAVIX) 75 MG tablet TAKE 1 TABLET(75 MG) BY MOUTH DAILY 90 tablet 0   cyclobenzaprine (FLEXERIL) 5 MG tablet Take 1 tablet (5 mg total) by mouth at bedtime. 90 tablet 1   dicyclomine (BENTYL) 20 MG tablet Take 1 tablet (20 mg total) by mouth 3 (three) times daily as needed for spasms. 40 tablet 0   Evolocumab (REPATHA SURECLICK) 140 MG/ML SOAJ Inject 140 mg into the skin every 14 (fourteen) days.  2 mL 11   Ferrous Gluconate (IRON 27 PO) Take 1 tablet by mouth daily.     ferrous sulfate 325 (65 FE) MG EC tablet Take 325 mg by mouth daily with breakfast.      gabapentin (NEURONTIN) 100 MG capsule Take 200 mg by mouth at bedtime.     icosapent Ethyl (VASCEPA) 1 g capsule TAKE 2 CAPSULES(2 GRAMS) BY MOUTH TWICE DAILY 120 capsule 3   loratadine (CLARITIN) 10 MG tablet Take 10 mg by mouth daily.     metFORMIN (GLUCOPHAGE-XR) 500 MG 24 hr tablet Take 1 tablet (500 mg total) by mouth 2 (two) times daily with a meal. 180 tablet 1   metoprolol succinate (TOPROL-XL) 50 MG 24 hr tablet Take 1 tablet (50 mg total) by mouth daily. Take with or immediately following a meal. 90 tablet 1   Multiple Vitamins-Minerals (CENTRUM CARDIO) TABS Take 1 tablet by mouth 2 (two) times daily.      nitroGLYCERIN (NITROSTAT) 0.4  MG SL tablet Place 1 tablet (0.4 mg total) under the tongue every 5 (five) minutes as needed for chest pain. 25 tablet 6   pantoprazole (PROTONIX) 40 MG tablet TAKE 1 TABLET (40 MG TOTAL) BY MOUTH DAILY. PLEASE SCHEDULE AN APPT. 90 tablet 1   sulfaSALAzine (AZULFIDINE) 500 MG EC tablet TAKE 2 TABLETS BY MOUTH TWICE DAILY 120 tablet 3   theophylline (THEODUR) 300 MG 12 hr tablet Take 1 tablet (300 mg total) by mouth 2 (two) times daily. 300 tablet 3   vedolizumab (ENTYVIO) 300 MG injection as directed Intravenous once month     No current facility-administered medications for this visit.    Allergies as of 10/08/2022   (No Known Allergies)    Family History  Problem Relation Age of Onset   Hypertension Mother    Lung cancer Father    Lung cancer Sister    GER disease Sister    Brain cancer Sister    Diabetes Maternal Grandmother    Emphysema Maternal Grandfather    COPD Maternal Grandfather    Pancreatic cancer Neg Hx    Stomach cancer Neg Hx    Colon cancer Neg Hx    Esophageal cancer Neg Hx    Colon polyps Neg Hx    Heart disease Neg Hx       Physical Exam: General:    Alert,  well-nourished, pleasant and cooperative in NAD Head:  Normocephalic and atraumatic. Eyes:  Sclera clear, no icterus.   Conjunctiva pink. Abdomen:  Soft, nontender, nondistended Neurologic:  Alert and  oriented x4;  grossly nonfocal Skin:  Intact without significant lesions or rashes. Psych:  Alert and cooperative. Normal mood and affect.    Eulah Pont, MD 10/08/2022, 1:37 PM   I spent 45 minutes of time, including in depth chart review, independent review of results as outlined above, communicating results with the patient directly, face-to-face time with the patient, coordinating care, ordering studies and medications as appropriate, and documentation.

## 2022-10-11 ENCOUNTER — Other Ambulatory Visit (HOSPITAL_COMMUNITY): Payer: Self-pay

## 2022-10-18 DIAGNOSIS — M8580 Other specified disorders of bone density and structure, unspecified site: Secondary | ICD-10-CM | POA: Diagnosis not present

## 2022-10-18 DIAGNOSIS — M797 Fibromyalgia: Secondary | ICD-10-CM | POA: Diagnosis not present

## 2022-10-18 DIAGNOSIS — M5136 Other intervertebral disc degeneration, lumbar region: Secondary | ICD-10-CM | POA: Diagnosis not present

## 2022-10-18 DIAGNOSIS — M450 Ankylosing spondylitis of multiple sites in spine: Secondary | ICD-10-CM | POA: Diagnosis not present

## 2022-10-23 ENCOUNTER — Other Ambulatory Visit (INDEPENDENT_AMBULATORY_CARE_PROVIDER_SITE_OTHER): Payer: BC Managed Care – PPO

## 2022-10-23 DIAGNOSIS — K5190 Ulcerative colitis, unspecified, without complications: Secondary | ICD-10-CM | POA: Diagnosis not present

## 2022-10-23 DIAGNOSIS — Z1159 Encounter for screening for other viral diseases: Secondary | ICD-10-CM | POA: Diagnosis not present

## 2022-10-23 DIAGNOSIS — D84821 Immunodeficiency due to drugs: Secondary | ICD-10-CM

## 2022-10-23 DIAGNOSIS — Z7952 Long term (current) use of systemic steroids: Secondary | ICD-10-CM

## 2022-10-23 DIAGNOSIS — T380X5A Adverse effect of glucocorticoids and synthetic analogues, initial encounter: Secondary | ICD-10-CM

## 2022-10-23 DIAGNOSIS — Z111 Encounter for screening for respiratory tuberculosis: Secondary | ICD-10-CM | POA: Diagnosis not present

## 2022-10-23 LAB — FOLATE: Folate: >23.8 ng/mL (ref >5.9)

## 2022-10-23 LAB — VITAMIN B12: Vitamin B-12: 1410 pg/mL — ABNORMAL HIGH (ref 211–911)

## 2022-10-24 ENCOUNTER — Ambulatory Visit (INDEPENDENT_AMBULATORY_CARE_PROVIDER_SITE_OTHER): Payer: BC Managed Care – PPO

## 2022-10-24 VITALS — BP 123/78 | HR 96 | Temp 98.0°F | Resp 16 | Ht 68.0 in | Wt 177.0 lb

## 2022-10-24 DIAGNOSIS — K5190 Ulcerative colitis, unspecified, without complications: Secondary | ICD-10-CM

## 2022-10-24 MED ORDER — VEDOLIZUMAB 300 MG IV SOLR
300.0000 mg | Freq: Once | INTRAVENOUS | Status: AC
  Administered 2022-10-24: 300 mg via INTRAVENOUS
  Filled 2022-10-24: qty 5

## 2022-10-24 NOTE — Patient Instructions (Signed)
Vedolizumab Injection What is this medication? VEDOLIZUMAB (ve doe LIZ you mab) treats inflammatory bowel diseases. It works by decreasing inflammation in the digestive tract. It is a monoclonal antibody. This medicine may be used for other purposes; ask your health care provider or pharmacist if you have questions. COMMON BRAND NAME(S): Entyvio What should I tell my care team before I take this medication? They need to know if you have any of these conditions: Immune system problems Infection, such a tuberculosis (TB) or other bacterial, fungal, or viral infections Liver disease Recent or upcoming vaccine An unusual or allergic reaction to vedolizumab, other medications, foods, dyes, or preservatives Pregnant or trying to get pregnant Breastfeeding How should I use this medication? This medication is injected into a vein or under the skin. It is given by your care team in a hospital or clinic setting if it is injected into a vein. If it is injected under the skin, it may be given at home. If you get this medication at home, you will be taught how to prepare and give it. Use exactly as directed. Take it as directed on the prescription label. Keep taking it unless your care team tells you to stop. A special MedGuide will be given to you by the pharmacist with each prescription and refill. Be sure to read this information carefully each time. This medication comes with INSTRUCTIONS FOR USE. Ask your pharmacist for directions on how to use this medication. Read the information carefully. Talk to your pharmacist or care team if you have questions. Talk to your care team about the use of this medication in children. Special care may be needed. Overdosage: If you think you have taken too much of this medicine contact a poison control center or emergency room at once. NOTE: This medicine is only for you. Do not share this medicine with others. What if I miss a dose? If you get this medication at the  hospital or clinic: It is important not to miss your dose. Call your care team if you are unable to keep an appointment. If you give yourself this medication at home: If you miss a dose, take it as soon as you can. Then resume dosing every 2 weeks. Do not take double or extra doses. Call your care team with questions. What may interact with this medication? Live virus vaccines Natalizumab TNF blockers, such as adalimumab or infliximab This medication may affect how other medications work. Talk with your care team about all of the medications you take. They may suggest changes to your treatment plan to lower the risk of side effects and to make sure your medications work as intended. This list may not describe all possible interactions. Give your health care provider a list of all the medicines, herbs, non-prescription drugs, or dietary supplements you use. Also tell them if you smoke, drink alcohol, or use illegal drugs. Some items may interact with your medicine. What should I watch for while using this medication? Visit your care team for regular checks on your progress. Tell your care team if your symptoms do not start to get better or if they get worse. This medication may increase your risk of getting an infection. Call your care team for advice if you get a fever, chills, sore throat, or other symptoms of a cold or flu. Do not treat yourself. Try to avoid being around people who are sick. In some patients, this medication may cause a serious brain infection that may cause death. If  you have any problems seeing, thinking, speaking, walking, or standing, tell your care team right away. If you cannot reach your care team, urgently seek other source of medical care. What side effects may I notice from receiving this medication? Side effects that you should report to your care team as soon as possible: Allergic reactions--skin rash, itching, hives, swelling of the face, lips, tongue, or  throat Dizziness, loss of balance or coordination, confusion or trouble speaking Infection--fever, chills, cough, sore throat, wounds that don't heal, pain or trouble when passing urine, general feeling of discomfort or being unwell Infusion reactions--chest pain, shortness of breath or trouble breathing, feeling faint or lightheaded Liver injury--right upper belly pain, loss of appetite, nausea, light-colored stool, dark yellow or brown urine, yellowing skin or eyes, unusual weakness or fatigue Side effects that usually do not require medical attention (report these to your care team if they continue or are bothersome): Headache Fatigue Joint pain Nausea This list may not describe all possible side effects. Call your doctor for medical advice about side effects. You may report side effects to FDA at 1-800-FDA-1088. Where should I keep my medication? Keep out of the reach of children and pets. Store in a refrigerator or at room temperature between 20 and 25 degrees C (68 and 77 degrees F). Refrigeration (preferred): Store in the refrigerator. Do not freeze. Keep unopened prefilled syringes or pens in the original packaging to protect from light. Get rid of any unused medication after the expiration date. Room temperature: This medication may be stored at room temperature for up to 7 days. Keep unopened prefilled syringes or pens in the original packaging to protect from light. Get rid of any unused medication after 7 days, or after it expires, whichever is first. To get rid of medications that are no longer needed or have expired: Take the medication to a medication take-back program. Check with your pharmacy or law enforcement to find a location. If you cannot return the medication, ask your pharmacist or care team how to get rid of this medication safely. NOTE: This sheet is a summary. It may not cover all possible information. If you have questions about this medicine, talk to your doctor,  pharmacist, or health care provider.  2024 Elsevier/Gold Standard (2022-07-16 00:00:00)

## 2022-10-24 NOTE — Progress Notes (Signed)
Diagnosis: Ulcerative Colitis  Provider:  Chilton Greathouse MD  Procedure: IV Infusion  IV Type: Peripheral, IV Location: L Forearm  Entyvio (Vedolizumab), Dose: 300 mg  Infusion Start Time: 1058  Infusion Stop Time: 1135  Post Infusion IV Care: Observation period completed. PIV removed  Discharge: Condition: Good, Destination: Home . AVS Provided  Performed by:  Rico Ala, LPN

## 2022-10-29 ENCOUNTER — Other Ambulatory Visit: Payer: Self-pay | Admitting: Interventional Cardiology

## 2022-11-04 ENCOUNTER — Other Ambulatory Visit: Payer: Self-pay | Admitting: Orthopedic Surgery

## 2022-11-04 DIAGNOSIS — E1169 Type 2 diabetes mellitus with other specified complication: Secondary | ICD-10-CM

## 2022-11-08 ENCOUNTER — Telehealth (INDEPENDENT_AMBULATORY_CARE_PROVIDER_SITE_OTHER): Payer: BC Managed Care – PPO | Admitting: Orthopedic Surgery

## 2022-11-08 ENCOUNTER — Encounter: Payer: Self-pay | Admitting: Orthopedic Surgery

## 2022-11-08 DIAGNOSIS — K5901 Slow transit constipation: Secondary | ICD-10-CM

## 2022-11-08 DIAGNOSIS — U071 COVID-19: Secondary | ICD-10-CM

## 2022-11-08 MED ORDER — NIRMATRELVIR/RITONAVIR (PAXLOVID)TABLET
3.0000 | ORAL_TABLET | Freq: Two times a day (BID) | ORAL | 0 refills | Status: AC
Start: 2022-11-08 — End: 2022-11-13

## 2022-11-08 NOTE — Progress Notes (Signed)
Careteam: Patient Care Team: Octavia Heir, NP as PCP - General (Adult Health Nurse Practitioner) Corky Crafts, MD as PCP - Cardiology (Cardiology)  Seen by: Hazle Nordmann, AGNP-C  PLACE OF SERVICE:  [facility]  Advanced Directive information Does Patient Have a Medical Advance Directive?: No, Would patient like information on creating a medical advance directive?: No - Patient declined  No Known Allergies  Chief Complaint  Patient presents with   Acute Visit    Patient tested positive for Covid-19 11/07/2022. Patient nausea, fatigue, sore throat, fever, body aches.      HPI: Patient is a 62 y.o. male seen today via video visit due to positive covid test.   He tested positive for covid last night. Symptoms include: fever (has not used thermometer), body aches, nasal congestion, productive cough, nausea and fatigue. He has been taking tylenol and mucinex for symptoms. Wife currently has covid as well. Denies chest pain or sob.   H/o ulcerative colitis. He has not had bowel movement x 3 days> unusual for him. Wife asking about metamucil. Denies abdominal pain, vomiting.    Review of Systems:  Review of Systems  Constitutional:  Positive for fever and malaise/fatigue.  HENT:  Positive for congestion. Negative for sore throat.   Respiratory:  Positive for cough and sputum production. Negative for shortness of breath and wheezing.   Cardiovascular:  Negative for chest pain and leg swelling.  Gastrointestinal:  Positive for constipation and nausea. Negative for abdominal pain, diarrhea and vomiting.  Musculoskeletal:  Positive for myalgias.  Neurological:  Negative for dizziness, weakness and headaches.  Psychiatric/Behavioral:  Negative for depression. The patient is not nervous/anxious.     Past Medical History:  Diagnosis Date   Anemia    Ankylosing spondylitis (HCC)    Arthritis    Asthma    SINCE AGE 30   Colitis, ulcerative (HCC)    Coronary artery disease     Bare metal LAD stent 10/08   Coronary atherosclerosis of native coronary artery 12/25/2012   ED (erectile dysfunction)    Elevated prostate specific antigen (PSA) 12/25/2012   Esophageal reflux 12/25/2012   Extrinsic asthma, unspecified 12/25/2012   Fibromyalgia    GERD (gastroesophageal reflux disease)    History of heart attack    Hypercalcemia 12/25/2012   Hyperlipidemia    Left sided ulcerative (chronic) colitis (HCC) 12/25/2012   Mixed hyperlipidemia 12/25/2012   Pneumonia    as a child   Pure hypercholesterolemia 12/25/2012   Skin cancer of eyelid    TMJ (dislocation of temporomandibular joint)    Past Surgical History:  Procedure Laterality Date   COLONOSCOPY     CORONARY/GRAFT ACUTE MI REVASCULARIZATION N/A 11/23/2019   Procedure: Coronary/Graft Acute MI Revascularization;  Surgeon: Tonny Bollman, MD;  Location: [facility] INVASIVE CV LAB;  Service: Cardiovascular;  Laterality: N/A;   EYE SURGERY Bilateral    EYE SURGERY     heart stent     LEFT HEART CATH AND CORONARY ANGIOGRAPHY N/A 11/23/2019   Procedure: LEFT HEART CATH AND CORONARY ANGIOGRAPHY;  Surgeon: Tonny Bollman, MD;  Location: [facility] INVASIVE CV LAB;  Service: Cardiovascular;  Laterality: N/A;   skin cancer removed from left eyelid      skull fracture after falling down stairs     TONSILLECTOMY     Social History:   reports that he has never smoked. He has quit using smokeless tobacco.  His smokeless tobacco use included snuff. He reports current alcohol use. He  reports that he does not use drugs.  Family History  Problem Relation Age of Onset   Hypertension Mother    Lung cancer Father    Lung cancer Sister    GER disease Sister    Brain cancer Sister    Diabetes Maternal Grandmother    Emphysema Maternal Grandfather    COPD Maternal Grandfather    Pancreatic cancer Neg Hx    Stomach cancer Neg Hx    Colon cancer Neg Hx    Esophageal cancer Neg Hx    Colon polyps Neg Hx    Heart disease Neg Hx      Medications: Patient's Medications  New Prescriptions   No medications on file  Previous Medications   ALBUTEROL (VENTOLIN HFA) 108 (90 BASE) MCG/ACT INHALER    INHALE 2 PUFFS INTO THE LUNGS EVERY 6 HOURS AS NEEDED FOR WHEEZING   ASPIRIN 81 MG TABLET    Take 81 mg by mouth daily.    CLOPIDOGREL (PLAVIX) 75 MG TABLET    TAKE 1 TABLET(75 MG) BY MOUTH DAILY   CYCLOBENZAPRINE (FLEXERIL) 5 MG TABLET    Take 1 tablet (5 mg total) by mouth at bedtime.   DICYCLOMINE (BENTYL) 20 MG TABLET    Take 1 tablet (20 mg total) by mouth 3 (three) times daily as needed for spasms.   EVOLOCUMAB (REPATHA SURECLICK) 140 MG/ML SOAJ    Inject 140 mg into the skin every 14 (fourteen) days.   FERROUS GLUCONATE (IRON 27 PO)    Take 1 tablet by mouth daily.   GABAPENTIN (NEURONTIN) 100 MG CAPSULE    Take 200 mg by mouth at bedtime.   ICOSAPENT ETHYL (VASCEPA) 1 G CAPSULE    TAKE 2 CAPSULE BY MOUTH TWICE DAILY   LORATADINE (CLARITIN) 10 MG TABLET    Take 10 mg by mouth daily.   METFORMIN (GLUCOPHAGE-XR) 500 MG 24 HR TABLET    TAKE 1 TABLET(500 MG) BY MOUTH TWICE DAILY WITH A MEAL   METOPROLOL SUCCINATE (TOPROL-XL) 50 MG 24 HR TABLET    Take 1 tablet (50 mg total) by mouth daily. Take with or immediately following a meal.   MULTIPLE VITAMINS-MINERALS (CENTRUM CARDIO) TABS    Take 1 tablet by mouth 2 (two) times daily.    NITROGLYCERIN (NITROSTAT) 0.4 MG SL TABLET    Place 1 tablet (0.4 mg total) under the tongue every 5 (five) minutes as needed for chest pain.   PANTOPRAZOLE (PROTONIX) 40 MG TABLET    TAKE 1 TABLET (40 MG TOTAL) BY MOUTH DAILY. PLEASE SCHEDULE AN APPT.   SULFASALAZINE (AZULFIDINE) 500 MG EC TABLET    TAKE 2 TABLETS BY MOUTH TWICE DAILY   THEOPHYLLINE (THEODUR) 300 MG 12 HR TABLET    Take 1 tablet (300 mg total) by mouth 2 (two) times daily.   VEDOLIZUMAB (ENTYVIO) 300 MG INJECTION    as directed Intravenous once month  Modified Medications   No medications on file  Discontinued Medications    FERROUS SULFATE 325 (65 FE) MG EC TABLET    Take 325 mg by mouth daily with breakfast.     Physical Exam:  There were no vitals filed for this visit. There is no height or weight on file to calculate BMI. Wt Readings from Last 3 Encounters:  10/24/22 177 lb (80.3 kg)  10/08/22 175 lb (79.4 kg)  09/06/22 175 lb 6.4 oz (79.6 kg)    Physical Exam Vitals (exam limited due to virtual visit) reviewed.  Constitutional:  General: He is not in acute distress. HENT:     Head: Normocephalic.  Neurological:     Mental Status: He is alert.     Labs reviewed: Basic Metabolic Panel: Recent Labs    02/21/22 1101 09/06/22 1359  NA 137 141  K 4.0 4.7  CL 100 104  CO2 29 27  GLUCOSE 285* 141*  BUN 18 20  CREATININE 0.87 0.92  CALCIUM 9.9 10.6*   Liver Function Tests: Recent Labs    02/21/22 1101  AST 12  ALT 16  ALKPHOS 78  BILITOT 0.5  PROT 7.6  ALBUMIN 4.4   No results for input(s): "LIPASE", "AMYLASE" in the last 8760 hours. No results for input(s): "AMMONIA" in the last 8760 hours. CBC: Recent Labs    02/21/22 1101 08/29/22 1150  WBC 7.3 6.7  NEUTROABS 3.2 3.3  HGB 14.7 14.2  HCT 43.0 42.7  MCV 92.0 93.4  PLT 283.0 289.0   Lipid Panel: No results for input(s): "CHOL", "HDL", "LDLCALC", "TRIG", "CHOLHDL", "LDLDIRECT" in the last 8760 hours. TSH: No results for input(s): "TSH" in the last 8760 hours. A1C: Lab Results  Component Value Date   HGBA1C 6.5 (H) 09/06/2022     Assessment/Plan 1. COVID-19 - home test positive 08/21 - Symptoms include: fever (has not used thermometer), body aches, nasal congestion, productive cough, nausea and fatigue - recommend vitamin C 1000 mg po daily x 7 days - recommend vitamin D 2000 units po daily x 7 days - recommend Zinc 50 mg po daily x 7 days - cont tylenol and mucinex for symptoms - contact PCP if symptoms worsen or do not resolve - nirmatrelvir/ritonavir (PAXLOVID) 20 x 150 MG & 10 x 100MG  TABS; Take 3  tablets by mouth 2 (two) times daily for 5 days. (Take nirmatrelvir 150 mg two tablets twice daily for 5 days and ritonavir 100 mg one tablet twice daily for 5 days) Patient GFR is 95  Dispense: 30 tablet; Refill: 0  2. Slow transit constipation - h/o ulcerative colitis - no BM in 3 days - asymptomatic - recommend prune juice to aid in BM - stay hydrated - light movement - may try miralax if above interventions do not work     Multimedia programmer Visit   I connected with Carolin Guernsey via virtual visit and verified that I am speaking with the correct person using two identifiers.  Location: [facility] Patient: [name] Provider: Octavia Heir, NP    I discussed the limitations, risks, security and privacy concerns of performing an evaluation and management service by telephone and the availability of in person appointments. I also discussed with the patient that there may be a patient responsible charge related to this service. The patient expressed understanding and agreed to proceed.   I discussed the assessment and treatment plan with the patient. The patient was provided an opportunity to ask questions and all were answered. The patient agreed with the plan and demonstrated an understanding of the instructions.   The patient was advised to call back or seek an in-person evaluation if the symptoms worsen or if the condition fails to improve as anticipated.  I provided 11 minutes of face-to-face time during this encounter.  Hazle Nordmann, Juel Burrow Avs printed and mailed    Next appt: 03/07/2023  Bettina Gavia  [facility] Medicine [phone]

## 2022-11-08 NOTE — Patient Instructions (Addendum)
-   recommend vitamin C 1000 mg po daily x 7 days - recommend vitamin D 2000 units po daily x 7 days - recommend Zinc 50 mg po daily x 7 days - cont tylenol and mucinex for symptoms - contact PCP if symptoms worsen or do not resolve  - recommend prune juice to aid in BM - stay hydrated - light movement - may try miralax if above interventions do not work

## 2022-11-08 NOTE — Progress Notes (Signed)
  This service is provided via telemedicine  No vital signs collected/recorded due to the encounter was a telemedicine visit.   Location of patient (ex: home, work):  Home  Patient consents to a telephone visit:  Yes  Location of the provider (ex: office, home):  [facility] Office  Name of any referring provider:  Fargo, Amy E, NP   Names of all persons participating in the telemedicine service and their role in the encounter:  Patient, [name], RMA, Amy Fargo, NP.    Time spent on call:  8 minutes spent on the phone with Medical Assistant.    

## 2022-12-06 ENCOUNTER — Other Ambulatory Visit: Payer: Self-pay | Admitting: Orthopedic Surgery

## 2022-12-06 DIAGNOSIS — I2510 Atherosclerotic heart disease of native coronary artery without angina pectoris: Secondary | ICD-10-CM

## 2022-12-13 ENCOUNTER — Other Ambulatory Visit: Payer: Self-pay | Admitting: Orthopedic Surgery

## 2022-12-18 ENCOUNTER — Other Ambulatory Visit: Payer: Self-pay

## 2022-12-18 DIAGNOSIS — I2510 Atherosclerotic heart disease of native coronary artery without angina pectoris: Secondary | ICD-10-CM

## 2022-12-18 MED ORDER — CLOPIDOGREL BISULFATE 75 MG PO TABS: 75.0000 mg | ORAL_TABLET | Freq: Every day | ORAL | 1 refills | Status: DC

## 2022-12-18 NOTE — Telephone Encounter (Signed)
Patient called requesting a refill on Plavix to [facility] and also asked that I delete [facility] from his account

## 2022-12-19 ENCOUNTER — Ambulatory Visit: Payer: BC Managed Care – PPO

## 2022-12-19 VITALS — BP 118/74 | HR 88 | Temp 98.1°F | Resp 16 | Ht 68.0 in | Wt 167.8 lb

## 2022-12-19 DIAGNOSIS — K5190 Ulcerative colitis, unspecified, without complications: Secondary | ICD-10-CM

## 2022-12-19 MED ORDER — VEDOLIZUMAB 300 MG IV SOLR
300.0000 mg | Freq: Once | INTRAVENOUS | Status: AC
  Administered 2022-12-19: 300 mg via INTRAVENOUS
  Filled 2022-12-19: qty 5

## 2022-12-19 NOTE — Progress Notes (Signed)
Diagnosis: Ulcerative Colitis  Provider:  Chilton Greathouse MD  Procedure: IV Infusion  IV Type: Peripheral, IV Location: L Antecubital  Entyvio (Vedolizumab), Dose: 300 mg  Infusion Start Time: 1101  Infusion Stop Time: 1141  Post Infusion IV Care: Patient declined observation and Peripheral IV Discontinued  Discharge: Condition: Good, Destination: Home . AVS Provided  Performed by:  Wyvonne Lenz, RN

## 2022-12-24 ENCOUNTER — Ambulatory Visit (INDEPENDENT_AMBULATORY_CARE_PROVIDER_SITE_OTHER): Payer: BC Managed Care – PPO | Admitting: Audiology

## 2022-12-24 NOTE — Progress Notes (Signed)
Patient was seen today for a hearing aid check. He reported diminished sound quality. Replaced the receivers, wax filters, and domes bilaterally. Connected him to the software and completed a firmware update. Set him at 110% overall gain. Suggested an updated audiogram if he continues to notice a decrease in volume. He was happy with the changes today. Instructed him to call if any concerns arise.  [name], AUD  12/24/22

## 2022-12-29 ENCOUNTER — Other Ambulatory Visit: Payer: Self-pay | Admitting: Gastroenterology

## 2022-12-29 DIAGNOSIS — K5180 Other ulcerative colitis without complications: Secondary | ICD-10-CM

## 2023-02-13 ENCOUNTER — Other Ambulatory Visit: Payer: Self-pay | Admitting: Interventional Cardiology

## 2023-02-13 ENCOUNTER — Ambulatory Visit: Payer: BC Managed Care – PPO

## 2023-02-13 VITALS — BP 120/76 | HR 91 | Temp 97.9°F | Resp 18 | Ht 68.0 in | Wt 179.0 lb

## 2023-02-13 DIAGNOSIS — K5190 Ulcerative colitis, unspecified, without complications: Secondary | ICD-10-CM | POA: Diagnosis not present

## 2023-02-13 MED ORDER — VEDOLIZUMAB 300 MG IV SOLR
300.0000 mg | Freq: Once | INTRAVENOUS | Status: AC
  Administered 2023-02-13: 300 mg via INTRAVENOUS
  Filled 2023-02-13: qty 5

## 2023-02-13 NOTE — Progress Notes (Signed)
Diagnosis: Ulcerative Colitis  Provider:  Chilton Greathouse MD  Procedure: IV Infusion  IV Type: Peripheral, IV Location: L Forearm  Entyvio (Vedolizumab), Dose: 300 mg  Infusion Start Time: 1059  Infusion Stop Time: 1140  Post Infusion IV Care: Peripheral IV Discontinued  Discharge: Condition: Good, Destination: Home . AVS Provided  Performed by:  Garnette Czech, RN

## 2023-02-21 ENCOUNTER — Other Ambulatory Visit: Payer: Self-pay | Admitting: Interventional Cardiology

## 2023-03-07 ENCOUNTER — Ambulatory Visit (INDEPENDENT_AMBULATORY_CARE_PROVIDER_SITE_OTHER): Payer: BC Managed Care – PPO | Admitting: Orthopedic Surgery

## 2023-03-07 ENCOUNTER — Encounter: Payer: Self-pay | Admitting: Orthopedic Surgery

## 2023-03-07 VITALS — BP 118/80 | HR 94 | Temp 96.8°F | Resp 16 | Ht 68.0 in | Wt 186.0 lb

## 2023-03-07 DIAGNOSIS — M450 Ankylosing spondylitis of multiple sites in spine: Secondary | ICD-10-CM

## 2023-03-07 DIAGNOSIS — E785 Hyperlipidemia, unspecified: Secondary | ICD-10-CM

## 2023-03-07 DIAGNOSIS — E1142 Type 2 diabetes mellitus with diabetic polyneuropathy: Secondary | ICD-10-CM | POA: Diagnosis not present

## 2023-03-07 DIAGNOSIS — E1165 Type 2 diabetes mellitus with hyperglycemia: Secondary | ICD-10-CM

## 2023-03-07 DIAGNOSIS — K5190 Ulcerative colitis, unspecified, without complications: Secondary | ICD-10-CM | POA: Diagnosis not present

## 2023-03-07 DIAGNOSIS — I2510 Atherosclerotic heart disease of native coronary artery without angina pectoris: Secondary | ICD-10-CM

## 2023-03-07 DIAGNOSIS — J4530 Mild persistent asthma, uncomplicated: Secondary | ICD-10-CM

## 2023-03-07 DIAGNOSIS — H903 Sensorineural hearing loss, bilateral: Secondary | ICD-10-CM | POA: Diagnosis not present

## 2023-03-07 NOTE — Patient Instructions (Addendum)
Second opinion for hearing> [facility] [phone]  Recommend getting flu vaccine   You need to have Tdap (tetanus) vaccine sometime next year > you may get at local pharmacy   Also consider getting Shingrix vaccine (newest shingles vaccine)

## 2023-03-07 NOTE — Progress Notes (Signed)
Careteam: Patient Care Team: Octavia Heir, NP as PCP - General (Adult Health Nurse Practitioner) Corky Crafts, MD as PCP - Cardiology (Cardiology)  Seen by: Hazle Nordmann, AGNP-C  PLACE OF SERVICE:  [facility]  Advanced Directive information Does Patient Have a Medical Advance Directive?: No, Would patient like information on creating a medical advance directive?: No - Patient declined  No Known Allergies  Chief Complaint  Patient presents with   Medical Management of Chronic Issues    6 month follow up.    Health Maintenance    Discuss the need for Eye exam.    Immunizations    Discuss the need for Covid Booster, DTAP vaccine, Influenza vaccine, and Shingrix vaccine.      HPI: Patient is a 62 y.o. male seen today for medical management of chronic conditions.   Continues to be [facility] with hearing aids. Followed by audiology, Dr. Kerry Hough.   Ulcerative colitis- Followed by Dr. Leonides Schanz. Entyvio infusions helping with symptoms. Still spending a lot of time using bathroom daily. Remains on sulfasalazine and dicyclomine. Off oral prednisone.   Last A1c 6.5 09/06/2022. Remains on metformin. Needs eye exam> last encounter 10/31/2021.   Ankylosing spondylitis- remains on gabapentin and flexeril  Asthma- remains on theophylline and albuterol prn, no recent exacerbations  Aortic atherosclerosis- H/o MI 2021, remains on plavix, asa and Repatha  Discussed Shingrix and Tdap vaccine.   He plans to get flu vaccine after Christmas.    Review of Systems:  Review of Systems  Constitutional: Negative.   HENT:  Positive for hearing loss.   Eyes: Negative.   Respiratory:  Negative for cough, shortness of breath and wheezing.   Cardiovascular:  Negative for chest pain and leg swelling.  Gastrointestinal:  Positive for abdominal pain, constipation and diarrhea.  Genitourinary: Negative.   Musculoskeletal: Negative.   Skin: Negative.   Neurological: Negative.    Psychiatric/Behavioral: Negative.      Past Medical History:  Diagnosis Date   Anemia    Ankylosing spondylitis (HCC)    Arthritis    Asthma    SINCE AGE 22   Colitis, ulcerative (HCC)    Coronary artery disease    Bare metal LAD stent 10/08   Coronary atherosclerosis of native coronary artery 12/25/2012   ED (erectile dysfunction)    Elevated prostate specific antigen (PSA) 12/25/2012   Esophageal reflux 12/25/2012   Extrinsic asthma, unspecified 12/25/2012   Fibromyalgia    GERD (gastroesophageal reflux disease)    History of heart attack    Hypercalcemia 12/25/2012   Hyperlipidemia    Left sided ulcerative (chronic) colitis (HCC) 12/25/2012   Mixed hyperlipidemia 12/25/2012   Pneumonia    as a child   Pure hypercholesterolemia 12/25/2012   Skin cancer of eyelid    TMJ (dislocation of temporomandibular joint)    Past Surgical History:  Procedure Laterality Date   COLONOSCOPY     CORONARY/GRAFT ACUTE MI REVASCULARIZATION N/A 11/23/2019   Procedure: Coronary/Graft Acute MI Revascularization;  Surgeon: Tonny Bollman, MD;  Location: [facility] INVASIVE CV LAB;  Service: Cardiovascular;  Laterality: N/A;   EYE SURGERY Bilateral    EYE SURGERY     heart stent     LEFT HEART CATH AND CORONARY ANGIOGRAPHY N/A 11/23/2019   Procedure: LEFT HEART CATH AND CORONARY ANGIOGRAPHY;  Surgeon: Tonny Bollman, MD;  Location: [facility] INVASIVE CV LAB;  Service: Cardiovascular;  Laterality: N/A;   skin cancer removed from left eyelid      skull  fracture after falling down stairs     TONSILLECTOMY     Social History:   reports that he has never smoked. He has quit using smokeless tobacco.  His smokeless tobacco use included snuff. He reports current alcohol use. He reports that he does not use drugs.  Family History  Problem Relation Age of Onset   Hypertension Mother    Lung cancer Father    Lung cancer Sister    GER disease Sister    Brain cancer Sister    Diabetes Maternal Grandmother     Emphysema Maternal Grandfather    COPD Maternal Grandfather    Pancreatic cancer Neg Hx    Stomach cancer Neg Hx    Colon cancer Neg Hx    Esophageal cancer Neg Hx    Colon polyps Neg Hx    Heart disease Neg Hx     Medications: Patient's Medications  New Prescriptions   No medications on file  Previous Medications   ALBUTEROL (VENTOLIN HFA) 108 (90 BASE) MCG/ACT INHALER    INHALE 2 PUFFS INTO THE LUNGS EVERY 6 HOURS AS NEEDED FOR WHEEZING   ASPIRIN 81 MG TABLET    Take 81 mg by mouth daily.    CLOPIDOGREL (PLAVIX) 75 MG TABLET    Take 1 tablet (75 mg total) by mouth daily.   CYCLOBENZAPRINE (FLEXERIL) 5 MG TABLET    Take 1 tablet (5 mg total) by mouth at bedtime.   DICYCLOMINE (BENTYL) 20 MG TABLET    Take 1 tablet (20 mg total) by mouth 3 (three) times daily as needed for spasms.   FERROUS GLUCONATE (IRON 27 PO)    Take 1 tablet by mouth daily.   GABAPENTIN (NEURONTIN) 100 MG CAPSULE    Take 200 mg by mouth at bedtime.   ICOSAPENT ETHYL (VASCEPA) 1 G CAPSULE    TAKE 2 CAPSULE BY MOUTH TWICE DAILY   LORATADINE (CLARITIN) 10 MG TABLET    Take 10 mg by mouth daily.   METFORMIN (GLUCOPHAGE-XR) 500 MG 24 HR TABLET    TAKE 1 TABLET(500 MG) BY MOUTH TWICE DAILY WITH A MEAL   METOPROLOL SUCCINATE (TOPROL-XL) 50 MG 24 HR TABLET    TAKE 1 TABLET(50 MG) BY MOUTH DAILY WITH OR IMMEDIATELY FOLLOWING A MEAL   MULTIPLE VITAMINS-MINERALS (CENTRUM CARDIO) TABS    Take 1 tablet by mouth 2 (two) times daily.    NITROGLYCERIN (NITROSTAT) 0.4 MG SL TABLET    Place 1 tablet (0.4 mg total) under the tongue every 5 (five) minutes as needed for chest pain.   PANTOPRAZOLE (PROTONIX) 40 MG TABLET    TAKE 1 TABLET(40 MG) BY MOUTH DAILY   REPATHA SURECLICK 140 MG/ML SOAJ    ADMINISTER 1 ML UNDER THE SKIN EVERY 14 DAYS   SULFASALAZINE (AZULFIDINE) 500 MG EC TABLET    TAKE 2 TABLETS BY MOUTH TWICE DAILY   THEOPHYLLINE (THEODUR) 300 MG 12 HR TABLET    Take 1 tablet (300 mg total) by mouth 2 (two) times daily.    VEDOLIZUMAB (ENTYVIO) 300 MG INJECTION    as directed Intravenous once month  Modified Medications   No medications on file  Discontinued Medications   No medications on file    Physical Exam:  Vitals:   03/07/23 1056  BP: 118/80  Pulse: 94  Resp: 16  Temp: (!) 96.8 F (36 C)  SpO2: 96%  Weight: 186 lb (84.4 kg)  Height: 5\' 8"  (1.727 m)   Body mass index is 28.28  kg/m. Wt Readings from Last 3 Encounters:  03/07/23 186 lb (84.4 kg)  02/13/23 179 lb (81.2 kg)  12/19/22 167 lb 12.8 oz (76.1 kg)    Physical Exam Vitals reviewed.  Constitutional:      General: He is not in acute distress. HENT:     Head: Normocephalic.     Ears:     Comments: Bilateral hearing aids    Mouth/Throat:     Mouth: Mucous membranes are moist.  Eyes:     General:        Right eye: No discharge.        Left eye: No discharge.  Cardiovascular:     Rate and Rhythm: Normal rate and regular rhythm.     Pulses: Normal pulses.     Heart sounds: Normal heart sounds.  Pulmonary:     Effort: Pulmonary effort is normal. No respiratory distress.     Breath sounds: Normal breath sounds. No wheezing or rales.  Abdominal:     General: Bowel sounds are normal. There is no distension.     Palpations: Abdomen is soft.     Tenderness: There is no abdominal tenderness.  Musculoskeletal:     Cervical back: Neck supple.     Right lower leg: No edema.     Left lower leg: No edema.  Skin:    General: Skin is warm.     Capillary Refill: Capillary refill takes less than 2 seconds.  Neurological:     General: No focal deficit present.     Mental Status: He is alert and oriented to person, place, and time.  Psychiatric:        Mood and Affect: Mood normal.    Labs reviewed: Basic Metabolic Panel: Recent Labs    09/06/22 1359  NA 141  K 4.7  CL 104  CO2 27  GLUCOSE 141*  BUN 20  CREATININE 0.92  CALCIUM 10.6*   Liver Function Tests: No results for input(s): "AST", "ALT", "ALKPHOS", "BILITOT",  "PROT", "ALBUMIN" in the last 8760 hours. No results for input(s): "LIPASE", "AMYLASE" in the last 8760 hours. No results for input(s): "AMMONIA" in the last 8760 hours. CBC: Recent Labs    08/29/22 1150  WBC 6.7  NEUTROABS 3.3  HGB 14.2  HCT 42.7  MCV 93.4  PLT 289.0   Lipid Panel: No results for input(s): "CHOL", "HDL", "LDLCALC", "TRIG", "CHOLHDL", "LDLDIRECT" in the last 8760 hours. TSH: No results for input(s): "TSH" in the last 8760 hours. A1C: Lab Results  Component Value Date   HGBA1C 6.5 (H) 09/06/2022     Assessment/Plan 1. Type 2 diabetes mellitus with diabetic polyneuropathy, without long-term current use of insulin (HCC) (Primary) - A1c 6.5 - cont metformin - eye exam overdue - cont diet low in carbs and sugars - Microalbumin/Creatinine Ratio, Urine - Hemoglobin A1c - Basic Metabolic Panel with eGFR  2. Sensorineural hearing loss (SNHL) of both ears - ongoing with bilateral hearing aids - followed by audiology  3. Ulcerative colitis without complications, unspecified location [facility]) - followed by Dr. Leonides Schanz - on Entyvio infusions - cont sulfasalazine and dicyclomine  4. Type 2 diabetes mellitus with hyperglycemia, without long-term current use of insulin (HCC) - A1c 6.5 - needs eye exam - cont metformin - repeat A1c and urine microalbumin  5. Ankylosing spondylitis of multiple sites in spine (HCC) - cont gabapentin and flexeril  6. Mild persistent asthma without complication - no exacerbations - cont theophylline and albuterol  7. Atherosclerosis of  native coronary artery of native heart without angina pectoris - h/o MI 2021 - cont repatha injections, plavix and asa  Total time: 34 minutes. Greater than 50% of total time spent doing patient education regarding health maintenance, UC, T2DM, hearing loss and asthma including symptom/medication management.    Next appt: Visit date not found  Isahi Godwin Scherry Ran  [facility] & Adult  Medicine [phone]

## 2023-03-08 ENCOUNTER — Other Ambulatory Visit: Payer: Self-pay | Admitting: Orthopedic Surgery

## 2023-03-08 DIAGNOSIS — E1169 Type 2 diabetes mellitus with other specified complication: Secondary | ICD-10-CM

## 2023-03-08 LAB — LIPID PANEL
Cholesterol: 105 mg/dL (ref <200)
HDL: 40 mg/dL (ref >=40)
LDL Cholesterol (Calc): 47 mg/dL
Non-HDL Cholesterol (Calc): 65 mg/dL (ref <130)
Total CHOL/HDL Ratio: 2.6 (calc) (ref <5.0)
Triglycerides: 92 mg/dL (ref <150)

## 2023-03-08 LAB — MICROALBUMIN / CREATININE URINE RATIO
Creatinine, Urine: 89 mg/dL (ref 20–320)
Microalb Creat Ratio: 53 mg/g{creat} — ABNORMAL HIGH (ref <30)
Microalb, Ur: 4.7 mg/dL

## 2023-03-08 LAB — BASIC METABOLIC PANEL WITH GFR
BUN: 19 mg/dL (ref 7–25)
CO2: 28 mmol/L (ref 20–32)
Calcium: 9.7 mg/dL (ref 8.6–10.3)
Chloride: 103 mmol/L (ref 98–110)
Creat: 0.92 mg/dL (ref 0.70–1.35)
Glucose, Bld: 179 mg/dL — ABNORMAL HIGH (ref 65–99)
Potassium: 4.6 mmol/L (ref 3.5–5.3)
Sodium: 138 mmol/L (ref 135–146)
eGFR: 94 mL/min/{1.73_m2} (ref >=60)

## 2023-03-08 LAB — HEMOGLOBIN A1C
Hgb A1c MFr Bld: 8.2 %{Hb} — ABNORMAL HIGH
Mean Plasma Glucose: 189 mg/dL
eAG (mmol/L): 10.4 mmol/L

## 2023-03-08 MED ORDER — METFORMIN HCL ER 500 MG PO TB24: ORAL_TABLET | ORAL | 2 refills | Status: DC

## 2023-03-08 MED ORDER — LISINOPRIL 2.5 MG PO TABS: 2.5000 mg | ORAL_TABLET | Freq: Every day | ORAL | 2 refills | Status: DC

## 2023-03-08 MED ORDER — EMPAGLIFLOZIN 10 MG PO TABS: 10.0000 mg | ORAL_TABLET | Freq: Every day | ORAL | 2 refills | Status: DC

## 2023-03-11 ENCOUNTER — Other Ambulatory Visit (HOSPITAL_COMMUNITY): Payer: Self-pay

## 2023-03-18 ENCOUNTER — Other Ambulatory Visit: Payer: Self-pay | Admitting: Orthopedic Surgery

## 2023-03-18 DIAGNOSIS — I2510 Atherosclerotic heart disease of native coronary artery without angina pectoris: Secondary | ICD-10-CM

## 2023-03-27 ENCOUNTER — Telehealth: Payer: Self-pay | Admitting: Interventional Cardiology

## 2023-03-27 NOTE — Telephone Encounter (Signed)
 Pt c/o medication issue:  1. Name of Medication:   REPATHA  SURECLICK 140 MG/ML SOAJ    2. How are you currently taking this medication (dosage and times per day)? ADMINISTER 1 ML UNDER THE SKIN EVERY 14 DAYS   3. Are you having a reaction (difficulty breathing--STAT)? No  4. What is your medication issue? Pt states he needs prior auth to get this medication. Please advise

## 2023-03-28 ENCOUNTER — Encounter: Payer: Self-pay | Admitting: Internal Medicine

## 2023-03-28 ENCOUNTER — Other Ambulatory Visit (HOSPITAL_COMMUNITY): Payer: Self-pay

## 2023-03-28 NOTE — Telephone Encounter (Signed)
 Pharmacy Patient Advocate Encounter   Received notification from Pt Calls Messages that prior authorization for Repatha  140mg /ml is required/requested.   Insurance verification completed.   The patient is insured through [facility]   .   Per test claim: The current 28 day co-pay is, $50.00.  No PA needed at this time. This test claim was processed through [facility]- copay amounts may vary at other pharmacies due to pharmacy/plan contracts, or as the patient moves through the different stages of their insurance plan.   I called the patient's pharmacy and they said the medication went through for a 15.00 copay at their pharmacy and they are processing the medication now.

## 2023-04-10 ENCOUNTER — Ambulatory Visit: Payer: BC Managed Care – PPO

## 2023-04-10 ENCOUNTER — Telehealth: Payer: Self-pay | Admitting: Cardiology

## 2023-04-10 VITALS — BP 105/68 | HR 94 | Temp 98.4°F | Resp 20 | Ht 68.0 in | Wt 180.0 lb

## 2023-04-10 DIAGNOSIS — K5190 Ulcerative colitis, unspecified, without complications: Secondary | ICD-10-CM | POA: Diagnosis not present

## 2023-04-10 MED ORDER — SODIUM CHLORIDE 0.9 % IV SOLN
300.0000 mg | Freq: Once | INTRAVENOUS | Status: AC
  Administered 2023-04-10: 300 mg via INTRAVENOUS
  Filled 2023-04-10: qty 5

## 2023-04-10 NOTE — Progress Notes (Signed)
Diagnosis: Ulcerative Colitis  Provider:  Chilton Greathouse MD  Procedure: IV Infusion  IV Type: Peripheral, IV Location: L Forearm  Entyvio (Vedolizumab), Dose: 300 mg  Infusion Start Time: 1110  Infusion Stop Time: 1145  Post Infusion IV Care: Peripheral IV Discontinued  Discharge: Condition: Good, Destination: Home . AVS Declined  Performed by:  Rico Ala, LPN

## 2023-04-10 NOTE — Telephone Encounter (Signed)
  Pt c/o medication issue:  1. Name of Medication:   REPATHA SURECLICK 140 MG/ML SOAJ    2. How are you currently taking this medication (dosage and times per day)?   ADMINISTER 1 ML UNDER THE SKIN EVERY 14 DAYS    3. Are you having a reaction (difficulty breathing--STAT)? No   4. What is your medication issue? Pt is calling to follow up his PA request for repatha. Per phone note sent on 03/27/23, provided this to the pt; Per test claim: The current 28 day co-pay is, $50.00.  No PA needed at this time. This test claim was processed through [facility]- copay amounts may vary at other pharmacies due to pharmacy/plan contracts, or as the patient moves through the different stages of their insurance plan.   I called the patient's pharmacy and they said the medication went through for a 15.00 copay at their pharmacy and they are processing the medication now.  Pt stated that he spoke with his pharmacy at [facility] and was told they had not received a PA and could not refill his medications. Pt requested that we call the pharmacy again to confirm. He is also requesting a callback once this has been completed.

## 2023-04-11 ENCOUNTER — Telehealth: Payer: Self-pay | Admitting: Pharmacy Technician

## 2023-04-11 ENCOUNTER — Other Ambulatory Visit (HOSPITAL_COMMUNITY): Payer: Self-pay

## 2023-04-11 NOTE — Telephone Encounter (Signed)
Pharmacy Patient Advocate Encounter   Received notification from Pt Calls Messages that prior authorization for repatha is required/requested.   Insurance verification completed.   The patient is insured through [employer]  .   Per test claim: PA required; PA submitted to above mentioned insurance via CoverMyMeds Key/confirmation #/[id number] Status is pending

## 2023-04-11 NOTE — Telephone Encounter (Signed)
Pharmacy Patient Advocate Encounter  Received notification from [facility] [place]  that Prior Authorization for repatha has been APPROVED from 03/12/23 to 04/10/24. Ran test claim, Copay is $50.00. This test claim was processed through [facility]- copay amounts may vary at other pharmacies due to pharmacy/plan contracts, or as the patient moves through the different stages of their insurance plan.   PA #/Case ID/Reference #: [id number]

## 2023-04-24 ENCOUNTER — Ambulatory Visit: Payer: BC Managed Care – PPO | Admitting: Physician Assistant

## 2023-04-25 DIAGNOSIS — M797 Fibromyalgia: Secondary | ICD-10-CM | POA: Diagnosis not present

## 2023-04-25 DIAGNOSIS — M450 Ankylosing spondylitis of multiple sites in spine: Secondary | ICD-10-CM | POA: Diagnosis not present

## 2023-04-25 DIAGNOSIS — M8580 Other specified disorders of bone density and structure, unspecified site: Secondary | ICD-10-CM | POA: Diagnosis not present

## 2023-04-25 DIAGNOSIS — K5180 Other ulcerative colitis without complications: Secondary | ICD-10-CM | POA: Diagnosis not present

## 2023-04-30 ENCOUNTER — Telehealth: Payer: Self-pay | Admitting: Pharmacy Technician

## 2023-04-30 ENCOUNTER — Other Ambulatory Visit: Payer: Self-pay | Admitting: Internal Medicine

## 2023-04-30 DIAGNOSIS — K5180 Other ulcerative colitis without complications: Secondary | ICD-10-CM

## 2023-04-30 NOTE — Telephone Encounter (Addendum)
Will need to do retro auth for 04/10/23 dose.  Auth Submission: APPROVED Site of care: Site of care: [facility] Payer: [employer] [place] Medication & CPT/J Code(s) submitted: Entyvio (Vedolizumab) [id number] Route of submission (phone, fax, portal):  Phone # Fax #[phone] Auth type: Buy/Bill PB Units/visits requested: 7 DOSES Reference number: [id number] Approval from: 04/30/23 to 04/29/24

## 2023-05-07 ENCOUNTER — Encounter: Payer: Self-pay | Admitting: Internal Medicine

## 2023-05-07 IMAGING — CR DG CHEST 2V
2 series · 2 of 2 positions shown · non-contrast
Comparison: November 23, 2019.

CLINICAL DATA: RIGHT-sided chest pain and anemia. Radiating to
RIGHT arm.

EXAM:
CHEST - 2 VIEW

[chest pa]
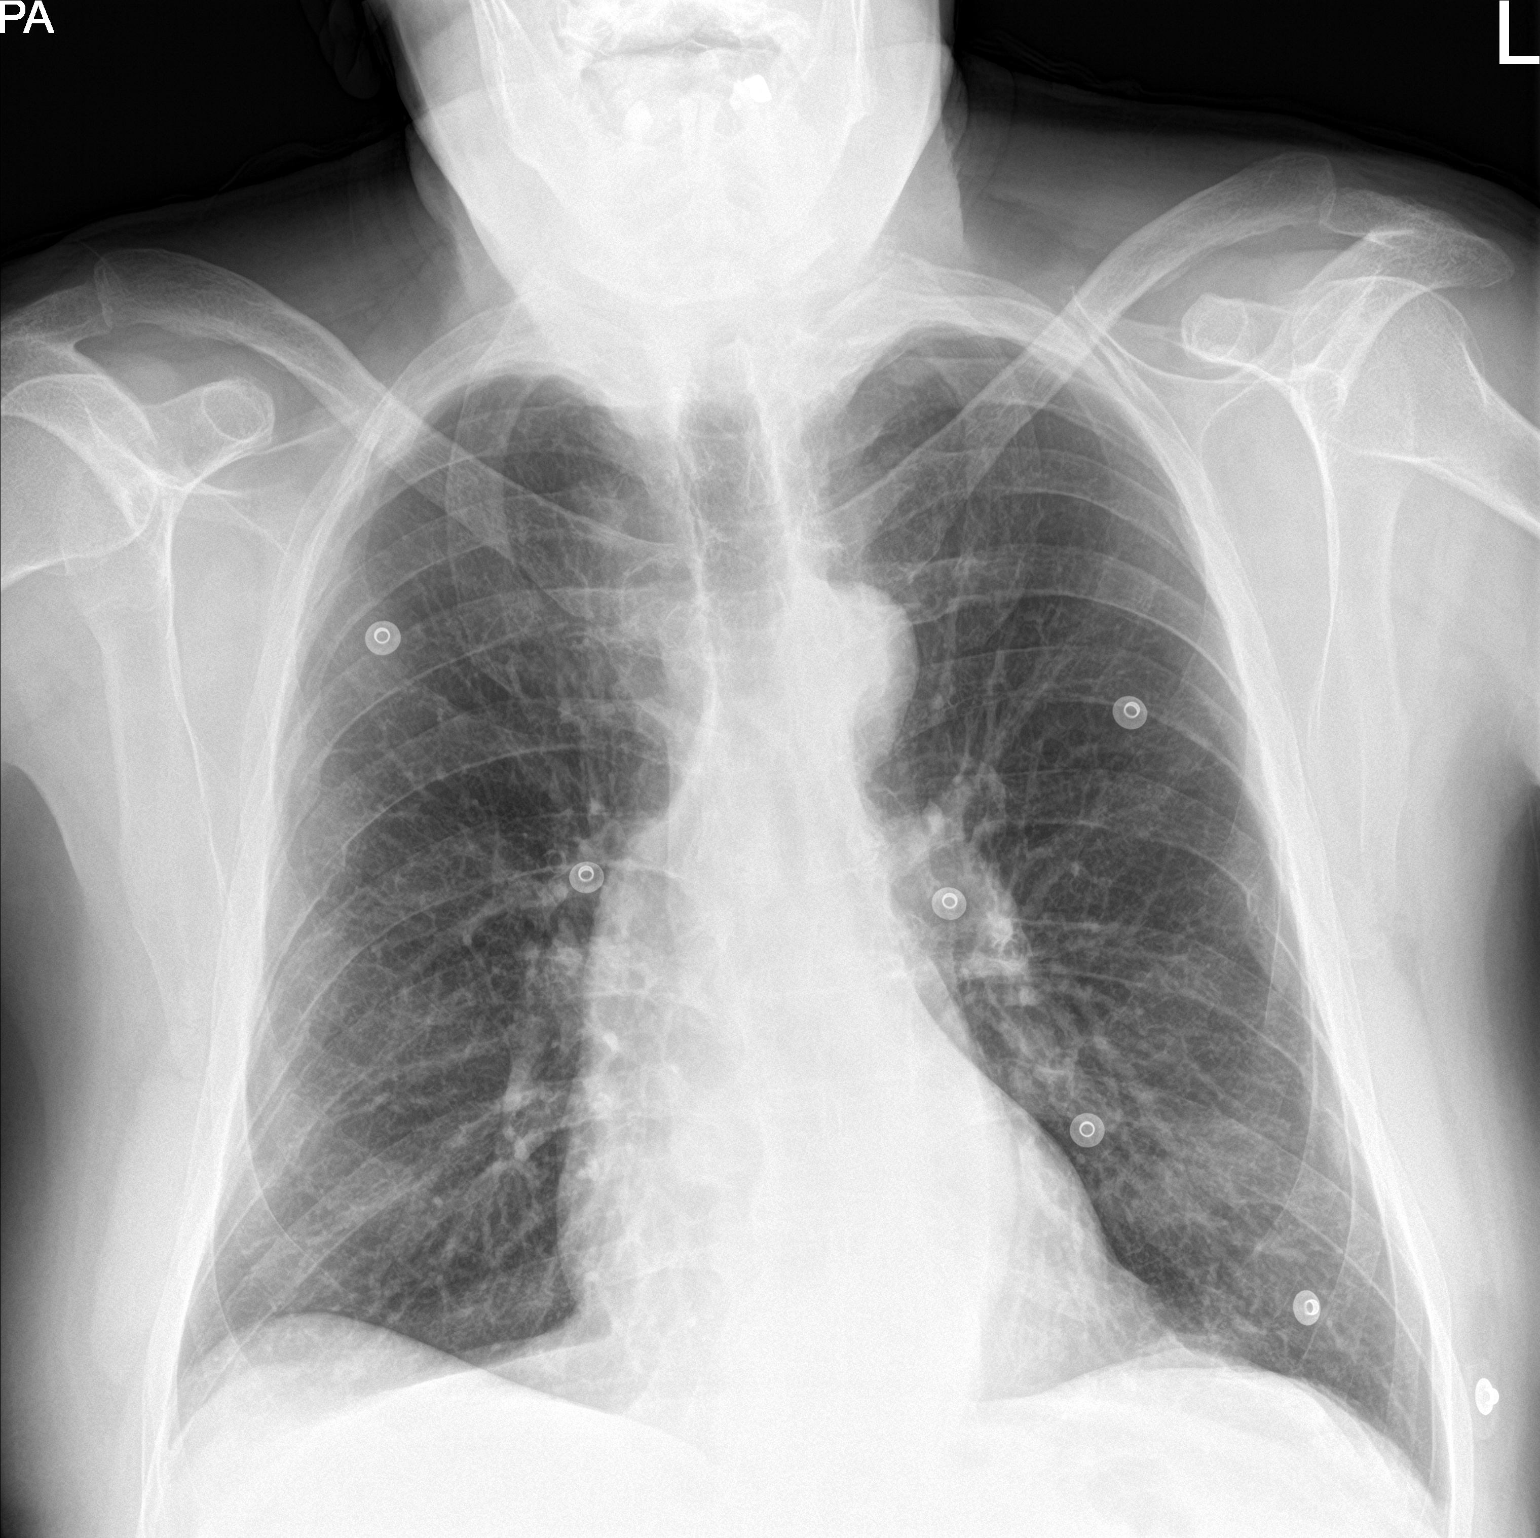

[chest lat]
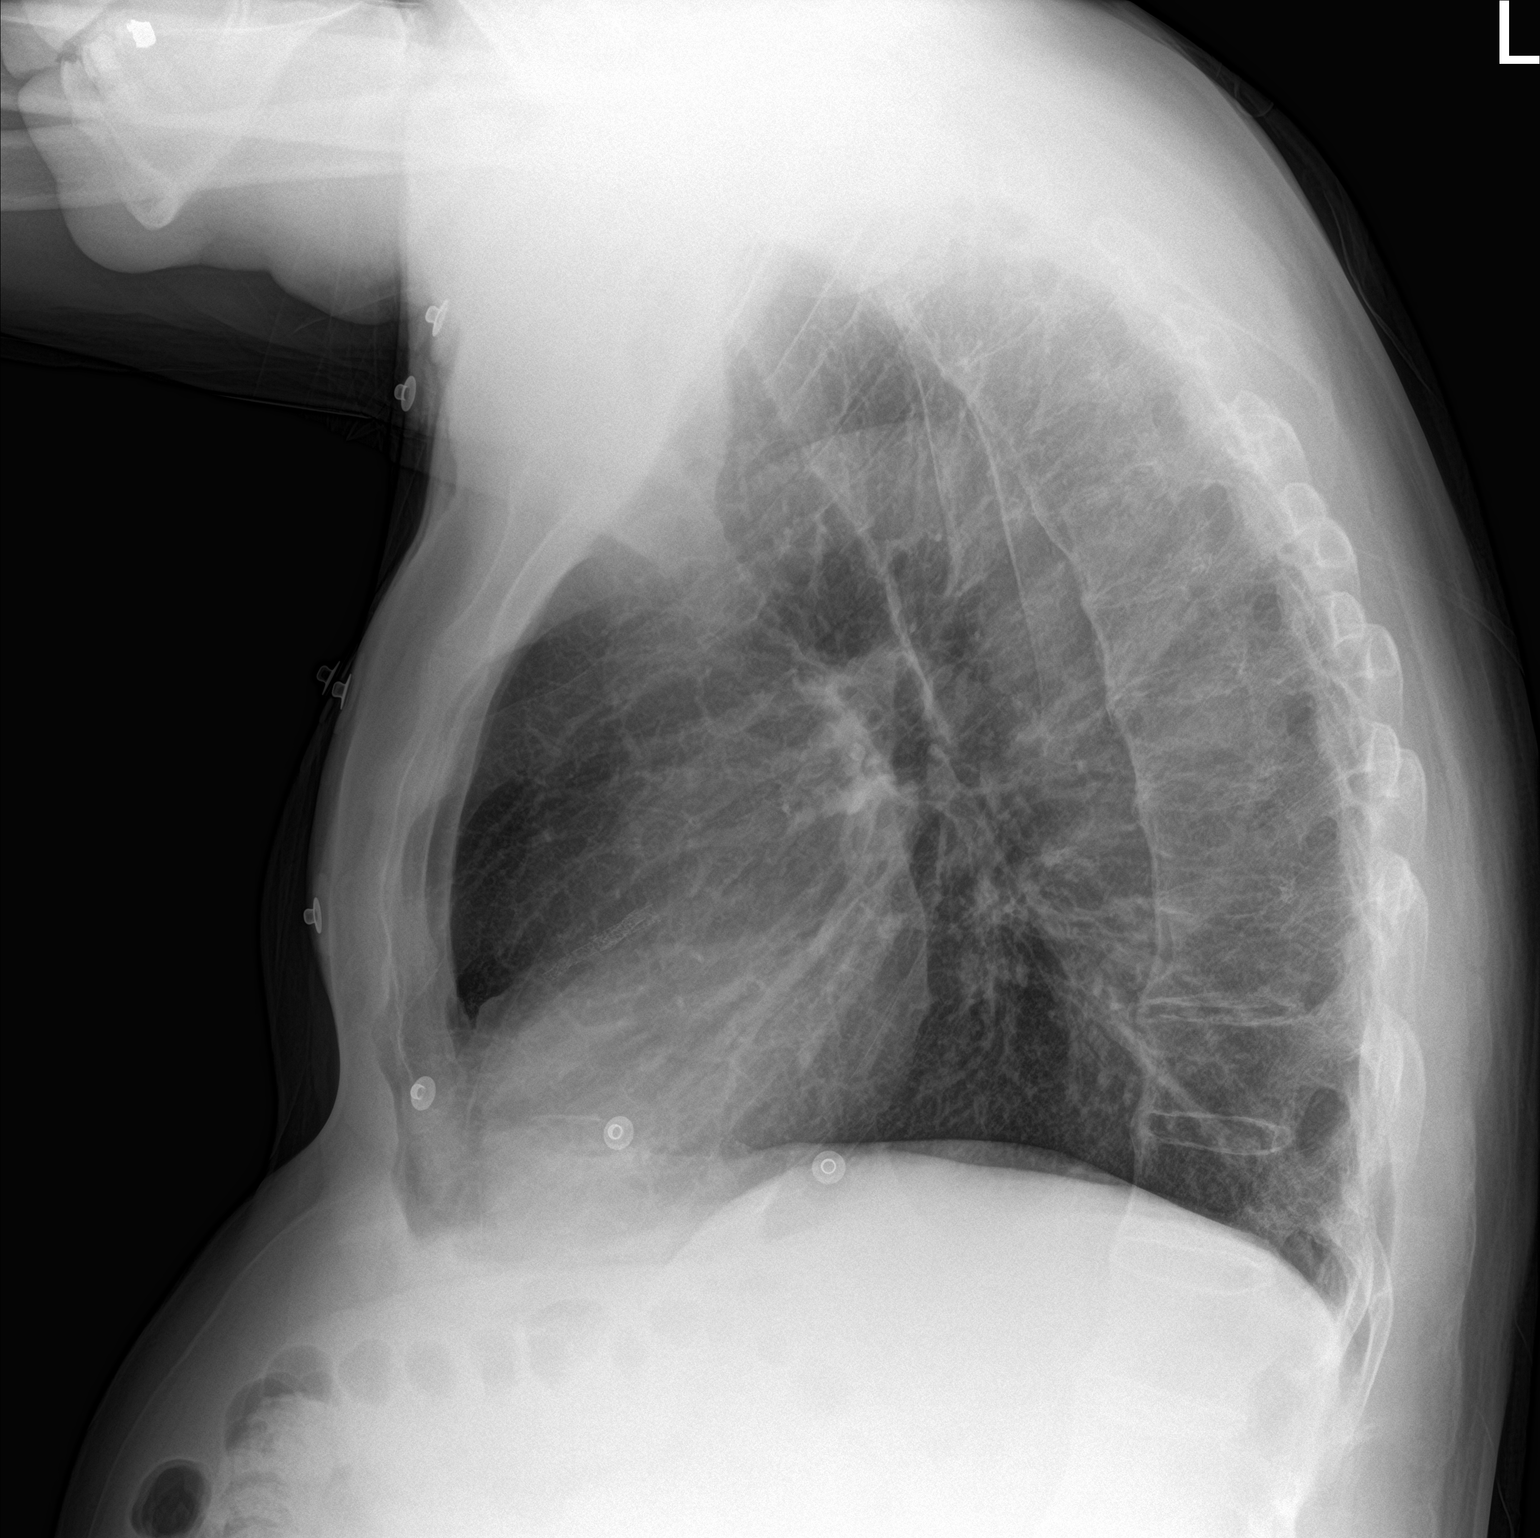

[2 of 2 positions shown; findings below may reference images not displayed]

FINDINGS: Trachea midline. Cardiomediastinal contours and hilar structures are
stable.

Lungs are hyperinflated. No sign of lobar consolidation or evidence
of pleural effusion.

Healed rib fractures along the LEFT chest. Spinal degenerative
changes. No acute skeletal findings on limited assessment. EKG leads
stickers project over the chest.
IMPRESSION: Findings of COPD without acute cardiopulmonary disease.

## 2023-05-27 DIAGNOSIS — R310 Gross hematuria: Secondary | ICD-10-CM | POA: Diagnosis not present

## 2023-05-27 DIAGNOSIS — N400 Enlarged prostate without lower urinary tract symptoms: Secondary | ICD-10-CM | POA: Diagnosis not present

## 2023-05-27 DIAGNOSIS — R300 Dysuria: Secondary | ICD-10-CM | POA: Diagnosis not present

## 2023-05-27 DIAGNOSIS — R972 Elevated prostate specific antigen [PSA]: Secondary | ICD-10-CM | POA: Diagnosis not present

## 2023-05-27 LAB — PSA: PSA: 9.93

## 2023-05-29 ENCOUNTER — Other Ambulatory Visit (HOSPITAL_COMMUNITY): Payer: Self-pay | Admitting: Urology

## 2023-05-29 ENCOUNTER — Other Ambulatory Visit: Payer: Self-pay | Admitting: Urology

## 2023-05-29 ENCOUNTER — Telehealth: Payer: Self-pay | Admitting: Cardiology

## 2023-05-29 DIAGNOSIS — R972 Elevated prostate specific antigen [PSA]: Secondary | ICD-10-CM

## 2023-05-29 NOTE — Telephone Encounter (Signed)
 S/w the pt and I moved NEW PT APPT FOR PREOP CLEARANCE SOONER. See notes, per Dr. Peggye Fothergill surgery scheduler surgeon feels pt may have cancer and asked for sooner new pt appt.   I will update all parties involved.

## 2023-05-29 NOTE — Telephone Encounter (Signed)
 [name] calling pt said doctor doesn't want pt to wait til 4/1 because he may have cancer. Please call them back.

## 2023-05-29 NOTE — Telephone Encounter (Signed)
 Per preop APP today Rise Paganini, NP to reach out to Urology in regard to appt for pt 06/18/23. I left vm for Rhonda. I will fax notes as FYI. Left message if this is URGENT we will need to review with preop APP.

## 2023-05-29 NOTE — Telephone Encounter (Signed)
   Pre-operative Risk Assessment    Patient Name: [name]  DOB: [birth year] MRN: [id number]      Request for Surgical Clearance    Procedure:   Cysto possible resection of bladder tumor and prostate biopsy  Date of Surgery:  Clearance 06/06/23                                 Surgeon:  Dr Robley Fries Surgeon's Group or Practice Name:  [facility] Phone number:  [phone] Fax number:  [phone]   Type of Clearance Requested:   - Pharmacy:  Hold Clopidogrel (Plavix) 5   Type of Anesthesia:  General    Additional requests/questions:    Nilda Riggs   05/29/2023, 8:08 AM

## 2023-05-30 ENCOUNTER — Other Ambulatory Visit

## 2023-05-30 ENCOUNTER — Ambulatory Visit: Payer: BC Managed Care – PPO | Admitting: Physician Assistant

## 2023-05-30 ENCOUNTER — Encounter: Payer: Self-pay | Admitting: Physician Assistant

## 2023-05-30 VITALS — BP 112/68 | HR 72 | Ht 68.0 in | Wt 168.0 lb

## 2023-05-30 DIAGNOSIS — K219 Gastro-esophageal reflux disease without esophagitis: Secondary | ICD-10-CM

## 2023-05-30 DIAGNOSIS — R310 Gross hematuria: Secondary | ICD-10-CM | POA: Diagnosis not present

## 2023-05-30 DIAGNOSIS — Z7952 Long term (current) use of systemic steroids: Secondary | ICD-10-CM

## 2023-05-30 DIAGNOSIS — D84821 Immunodeficiency due to drugs: Secondary | ICD-10-CM

## 2023-05-30 DIAGNOSIS — M450 Ankylosing spondylitis of multiple sites in spine: Secondary | ICD-10-CM | POA: Diagnosis not present

## 2023-05-30 DIAGNOSIS — D122 Benign neoplasm of ascending colon: Secondary | ICD-10-CM

## 2023-05-30 DIAGNOSIS — K5190 Ulcerative colitis, unspecified, without complications: Secondary | ICD-10-CM | POA: Diagnosis not present

## 2023-05-30 DIAGNOSIS — K5100 Ulcerative (chronic) pancolitis without complications: Secondary | ICD-10-CM

## 2023-05-30 LAB — HIGH SENSITIVITY CRP: CRP, High Sensitivity: 22.51 mg/L — ABNORMAL HIGH (ref 0.000–5.000)

## 2023-05-30 LAB — SEDIMENTATION RATE: Sed Rate: 47 mm/h — ABNORMAL HIGH (ref 0–20)

## 2023-05-30 NOTE — Progress Notes (Signed)
 COVID Vaccine Completed:  Yes  Date of COVID positive in last 90 days:  PCP - Hazle Nordmann, NP Cardiologist - Lance Muss, MD  Chest x-ray -  EKG - 06-20-22 Epic Stress Test -  ECHO - 11-24-19 Epic Cardiac Cath - 11-23-19 Epic Pacemaker/ICD device last checked: Spinal Cord Stimulator: Holter monitor - 07-08-17 Epic  Bowel Prep -   Sleep Study -  CPAP -   Fasting Blood Sugar -  Checks Blood Sugar _____ times a day  Last dose of GLP1 agonist-  N/A GLP1 instructions:  Hold 7 days before surgery    Jardiance Last dose of SGLT-2 inhibitors-  06-02-23 SGLT-2 instructions:  Hold 3 days before surgery    Blood Thinner Instructions:  Last dose:   Time: Aspirin Instructions: Last Dose:  Activity level:  Can go up a flight of stairs and perform activities of daily living without stopping and without symptoms of chest pain or shortness of breath.  Able to exercise without symptoms  Unable to go up a flight of stairs without symptoms of     Anesthesia review:  CAD, hx of MI, DM  Patient denies shortness of breath, fever, cough and chest pain at PAT appointment  Patient verbalized understanding of instructions that were given to them at the PAT appointment. Patient was also instructed that they will need to review over the PAT instructions again at home before surgery.

## 2023-05-30 NOTE — Progress Notes (Signed)
 05/30/2023 [name] [id number] 07-26-1960  Referring provider: Octavia Heir, NP Primary GI doctor: Dr. Leonides Schanz  ASSESSMENT AND PLAN:     Pancolonic ulcerative colitis.  Last colonoscopy 07/13/2021 showed chronic ulcerative colitis to the right and left colon.  Entyvio started on infusions in 03/2022, last antibody negative in Aug, trough low at 12, he is getting every 8 weeks. If any inflammation or increase in symptoms, consider changing to every 6 weeks.  Consider colonoscopy to assess disease activity but patient getting work up for gross hematuria and declines Follow up 4-6 months   Chronic prednisone use suggest bone density   Hyperplastic polyp removed from the ascending colon per colonoscopy 07/13/2021.    GERD  Continue Pantoprazole 40mg  QD   Ankylosing spondylitis of multiple sites in spine. Previously treated with Humira and Remicade without noticeable improvement with UC symptoms.   No difference in symptoms Continue follow-up with Dr. Dierdre Forth.  Gross hematuria No imaging at this time, will get notes X Friday, following with urology Getting cystoscopy and prostate biopsy 06/06/2023 Right flank pain, urinary urgency, no fever, chills   Patient Care Team: Octavia Heir, NP as PCP - General (Adult Health Nurse Practitioner) Corky Crafts, MD as PCP - Cardiology (Cardiology)  HISTORY OF PRESENT ILLNESS: 63 y.o. male presents for evaluation of coronary artery disease s/p stent to the LAD 2008 s/p STEMI s/p DES x two 11/2019 on ASA and Plavix,  DM II, GERD, ankylosing spondylitis and ulcerative colitis . Last seen in the office on 10/08/2022   IBD history: Previously on Humira and Remicade for ankylosing spondylitis but was not helping colitis Prednisone for years Entyvio since 03/2022 Sulfasalazine 1000 mg twice daily  Entyvio antibody 10/23/2022 negative however trough was only 12  Last colonoscopy:  Colonoscopy 07/13/2021 which showed mild  ulcerative colitis Mayo score 1, diverticulosis to the sigmoid and descending colon and a 3-4 hyperplastic polyp in the ascending colon. Ascending colon with chronic colitis moderately active phase, further colon biopsies showed chronic ulcerative colitis throughout the colon at 90 - 40cm, nonspecific inflammation at 30 - 20 cm without evidence of active IBD then chronic ulcerative colitis at 10cm and no evidence of IBD to the rectum.  It was Extraintestinal manifestations: Follows Dr. Dierdre Forth Surgical history: no surgery.  Other significant medical history:  Current History Discussed the use of AI scribe software for clinical note transcription with the patient, who gave verbal consent to proceed.  History of Present Illness   He is a 63 year old with ankylosing spondylitis and ulcerative colitis who presents with blood in urine and bowel habit changes.  He has been experiencing blood in his urine since last Friday, describing it as 'chunks of blood' which he finds disturbing. The onset was sudden and the bleeding has been significant. He has an upcoming biopsy scheduled for next Thursday. No fever or chills are present, but he mentions occasional right flank tenderness and a sense of urgency with urination.  Regarding bowel habits, he notes improvement compared to the past, with more formed stools and no blood present. He typically requires two hours in the morning for three to four bowel movements, which has been a long-standing issue. No abdominal or rectal pain is reported.  He has a history of ulcerative colitis, managed with Entyvio infusions every eight weeks. He does not notice any change in symptoms towards the end of the infusion cycle. His last trough level was low normal at 12,  with a preference for levels above 14.  He also has a history of ankylosing spondylitis, which began in his mid to late twenties.     Recent labs: 02/21/2022 CRP 1.5  02/21/2022 SED RATE 26 08/29/2022 WBC 6.7  HGB 14.2 MCV 93.4 Platelets 289.0 02/21/2022 Iron 137 Ferritin 97.7  B12 1,410 FOLATE >23.8 02/21/2022 AST 12 ALT 16 Alkphos 78 TBili 0.5  TB GOLD 10/23/2022 NEGATIVE or TB skin if indeterminate.  HepBsAG 05/19/2021 NON-REACTIVE   Immunization History  Administered Date(s) Administered   Fluad Quad(high Dose 65+) 04/27/2021   Influenza, Quadrivalent, Recombinant, Inj, Pf 12/08/2019   Influenza-Unspecified 12/27/2008, 12/28/2009, 01/08/2011, 02/05/2012, 04/28/2013, 12/08/2019   PFIZER(Purple Top)SARS-COV-2 Vaccination 06/18/2019, 07/17/2019   PNEUMOCOCCAL CONJUGATE-20 10/08/2022   Pneumococcal Polysaccharide-23 12/08/2019   Td 01/08/2011   Tdap 03/19/2010, 01/08/2011    RELEVANT GI HISTORY, LABS, IMAGING:  CBC    Component Value Date/Time   WBC 6.7 08/29/2022 1150   RBC 4.58 08/29/2022 1150   HGB 14.2 08/29/2022 1150   HCT 42.7 08/29/2022 1150   PLT 289.0 08/29/2022 1150   MCV 93.4 08/29/2022 1150   MCH 30.5 04/27/2021 1202   MCHC 33.1 08/29/2022 1150   RDW 12.6 08/29/2022 1150   LYMPHSABS 1.9 08/29/2022 1150   MONOABS 0.4 08/29/2022 1150   EOSABS 0.9 (H) 08/29/2022 1150   BASOSABS 0.2 (H) 08/29/2022 1150   Recent Labs    08/29/22 1150  HGB 14.2    CMP     Component Value Date/Time   NA 138 03/07/2023 1145   NA 140 03/03/2021 1122   K 4.6 03/07/2023 1145   CL 103 03/07/2023 1145   CO2 28 03/07/2023 1145   GLUCOSE 179 (H) 03/07/2023 1145   BUN 19 03/07/2023 1145   BUN 14 03/03/2021 1122   CREATININE 0.92 03/07/2023 1145   CALCIUM 9.7 03/07/2023 1145   PROT 7.6 02/21/2022 1101   PROT 7.3 03/03/2021 1122   ALBUMIN 4.4 02/21/2022 1101   ALBUMIN 4.8 03/03/2021 1122   AST 12 02/21/2022 1101   ALT 16 02/21/2022 1101   ALKPHOS 78 02/21/2022 1101   BILITOT 0.5 02/21/2022 1101   BILITOT 0.4 03/03/2021 1122   GFRNONAA >60 08/07/2020 1115   GFRAA >60 11/24/2019 0217      Latest Ref Rng & Units 02/21/2022   11:01 AM 04/27/2021   12:02 PM 03/03/2021   11:22 AM   Hepatic Function  Total Protein 6.0 - 8.3 g/dL 7.6  7.4  7.3   Albumin 3.5 - 5.2 g/dL 4.4   4.8   AST 0 - 37 U/L 12  15  18    ALT 0 - 53 U/L 16  19  19    Alk Phosphatase 39 - 117 U/L 78   87   Total Bilirubin 0.2 - 1.2 mg/dL 0.5  0.6  0.4       Current Medications:   Current Outpatient Medications (Endocrine & Metabolic):    empagliflozin (JARDIANCE) 10 MG TABS tablet, Take 1 tablet (10 mg total) by mouth daily before breakfast.   metFORMIN (GLUCOPHAGE-XR) 500 MG 24 hr tablet, Take 2 tablets (1,000 mg total) by mouth daily with breakfast AND 1 tablet (500 mg total) daily with supper.  Current Outpatient Medications (Cardiovascular):    icosapent Ethyl (VASCEPA) 1 g capsule, TAKE 2 CAPSULE BY MOUTH TWICE DAILY   lisinopril (ZESTRIL) 2.5 MG tablet, Take 1 tablet (2.5 mg total) by mouth daily.   metoprolol succinate (TOPROL-XL) 50 MG 24 hr tablet, TAKE 1  TABLET(50 MG) BY MOUTH DAILY WITH OR IMMEDIATELY FOLLOWING A MEAL   nitroGLYCERIN (NITROSTAT) 0.4 MG SL tablet, Place 1 tablet (0.4 mg total) under the tongue every 5 (five) minutes as needed for chest pain.   REPATHA SURECLICK 140 MG/ML SOAJ, ADMINISTER 1 ML UNDER THE SKIN EVERY 14 DAYS  Current Outpatient Medications (Respiratory):    albuterol (VENTOLIN HFA) 108 (90 Base) MCG/ACT inhaler, INHALE 2 PUFFS INTO THE LUNGS EVERY 6 HOURS AS NEEDED FOR WHEEZING   loratadine (CLARITIN) 10 MG tablet, Take 10 mg by mouth daily.   theophylline (THEODUR) 300 MG 12 hr tablet, Take 1 tablet (300 mg total) by mouth 2 (two) times daily.  Current Outpatient Medications (Analgesics):    aspirin 81 MG tablet, Take 81 mg by mouth daily.   Current Outpatient Medications (Hematological):    clopidogrel (PLAVIX) 75 MG tablet, TAKE 1 TABLET(75 MG) BY MOUTH DAILY   Ferrous Gluconate (IRON 27 PO), Take 1 tablet by mouth daily.  Current Outpatient Medications (Other):    cyclobenzaprine (FLEXERIL) 5 MG tablet, Take 1 tablet (5 mg total) by mouth at  bedtime.   dicyclomine (BENTYL) 20 MG tablet, Take 1 tablet (20 mg total) by mouth 3 (three) times daily as needed for spasms.   gabapentin (NEURONTIN) 100 MG capsule, Take 200 mg by mouth at bedtime.   Multiple Vitamins-Minerals (CENTRUM CARDIO) TABS, Take 1 tablet by mouth 2 (two) times daily.    pantoprazole (PROTONIX) 40 MG tablet, TAKE 1 TABLET(40 MG) BY MOUTH DAILY   sulfaSALAzine (AZULFIDINE) 500 MG EC tablet, TAKE 2 TABLETS BY MOUTH TWICE DAILY   vedolizumab (ENTYVIO) 300 MG injection, Inject into the vein every 8 (eight) weeks.  Medical History:  Past Medical History:  Diagnosis Date   Anemia    Ankylosing spondylitis (HCC)    Arthritis    Asthma    SINCE AGE 87   Colitis, ulcerative (HCC)    Coronary artery disease    Bare metal LAD stent 10/08   Coronary atherosclerosis of native coronary artery 12/25/2012   ED (erectile dysfunction)    Elevated prostate specific antigen (PSA) 12/25/2012   Esophageal reflux 12/25/2012   Extrinsic asthma, unspecified 12/25/2012   Fibromyalgia    GERD (gastroesophageal reflux disease)    History of heart attack    Hypercalcemia 12/25/2012   Hyperlipidemia    Left sided ulcerative (chronic) colitis (HCC) 12/25/2012   Mixed hyperlipidemia 12/25/2012   Pneumonia    as a child   Pure hypercholesterolemia 12/25/2012   Skin cancer of eyelid    TMJ (dislocation of temporomandibular joint)    Allergies: No Known Allergies   Surgical History:  He  has a past surgical history that includes Tonsillectomy; heart stent; skull fracture after falling down stairs; Eye surgery (Bilateral); Colonoscopy; skin cancer removed from left eyelid ; Coronary/Graft Acute MI Revascularization (N/A, 11/23/2019); LEFT HEART CATH AND CORONARY ANGIOGRAPHY (N/A, 11/23/2019); and Eye surgery. Family History:  His family history includes Brain cancer in his sister; COPD in his maternal grandfather; Diabetes in his maternal grandmother; Emphysema in his maternal  grandfather; GER disease in his sister; Hypertension in his mother; Lung cancer in his father and sister.  REVIEW OF SYSTEMS  : All other systems reviewed and negative except where noted in the History of Present Illness.  PHYSICAL EXAM: BP 112/68   Pulse 72   Ht 5\' 8"  (1.727 m)   Wt 168 lb (76.2 kg)   BMI 25.54 kg/m  Physical Exam  GENERAL APPEARANCE: Well nourished, in no apparent distress, well developed. HEENT: No cervical lymphadenopathy, unremarkable thyroid, sclerae anicteric, conjunctiva pink. RESPIRATORY: Respiratory effort normal, breath sounds equal bilaterally without rales, rhonchi, or wheezing. CARDIO: Regular rate and rhythm with no murmurs, rubs, or gallops, peripheral pulses intact. ABDOMEN: Soft, non-distended, active bowel sounds in all four quadrants, no tenderness to palpation, no rebound, no mass appreciated, no rashes. RECTAL: Declines. MUSCULOSKELETAL: Full range of motion, normal gait, without edema. SKIN: Dry, intact without rashes or lesions. No jaundice. NEURO: Alert, oriented, no focal deficits. PSYCH: Cooperative, normal mood and affect.        Doree Albee, PA-C 12:09 PM

## 2023-05-30 NOTE — Patient Instructions (Addendum)
 Your provider has requested that you go to the basement level for lab work before leaving today. Press "B" on the elevator. The lab is located at the first door on the left as you exit the elevator.  At this visit we discussed starting a biologic therapy to help control your inflammatory bowel disease. Please visit these websites below for more information: [email or web address]/ [email or web address].org/   -We also encourage all of her patients with inflammatory bowel disease whether on a biologic or not to stay up-to-date on her immunizations due to this reason. Typically we suggest getting the shingles vaccine or Shingrix, as well as yearly influenza, COVID, pneumococcal vaccines.  - We also suggest yearly skin and eye exams.   _______________________________________________________  If your blood pressure at your visit was 140/90 or greater, please contact your primary care physician to follow up on this.  _______________________________________________________  If you are age 19 or older, your body mass index should be between 23-30. Your Body mass index is 25.54 kg/m. If this is out of the aforementioned range listed, please consider follow up with your Primary Care Provider.  If you are age 75 or younger, your body mass index should be between 19-25. Your Body mass index is 25.54 kg/m. If this is out of the aformentioned range listed, please consider follow up with your Primary Care Provider.   ________________________________________________________  The [facility] providers would like to encourage you to use [facility] to communicate with providers for non-urgent requests or questions.  Due to long hold times on the telephone, sending your provider a message by [facility] may be a faster and more efficient way to get a response.  Please allow 48 business hours for a response.  Please remember that this is for non-urgent requests.   _______________________________________________________ It was a pleasure to see you today!  Thank you for trusting me with your gastrointestinal care!

## 2023-05-31 ENCOUNTER — Telehealth: Payer: Self-pay | Admitting: Pharmacy Technician

## 2023-05-31 ENCOUNTER — Other Ambulatory Visit (HOSPITAL_COMMUNITY): Payer: Self-pay

## 2023-05-31 ENCOUNTER — Other Ambulatory Visit: Payer: Self-pay

## 2023-05-31 ENCOUNTER — Encounter: Payer: Self-pay | Admitting: Internal Medicine

## 2023-05-31 ENCOUNTER — Encounter (HOSPITAL_COMMUNITY): Payer: Self-pay | Admitting: Urology

## 2023-05-31 ENCOUNTER — Encounter: Payer: Self-pay | Admitting: Cardiology

## 2023-05-31 ENCOUNTER — Ambulatory Visit: Attending: Cardiology | Admitting: Cardiology

## 2023-05-31 VITALS — BP 115/70 | HR 100 | Resp 16 | Ht 68.0 in | Wt 169.0 lb

## 2023-05-31 DIAGNOSIS — E782 Mixed hyperlipidemia: Secondary | ICD-10-CM | POA: Diagnosis not present

## 2023-05-31 DIAGNOSIS — I2510 Atherosclerotic heart disease of native coronary artery without angina pectoris: Secondary | ICD-10-CM | POA: Diagnosis not present

## 2023-05-31 DIAGNOSIS — Z01818 Encounter for other preprocedural examination: Secondary | ICD-10-CM | POA: Diagnosis not present

## 2023-05-31 NOTE — Progress Notes (Signed)
 I agree with the assessment and plan as outlined by Ms. Steffanie Dunn.

## 2023-05-31 NOTE — Telephone Encounter (Signed)
 Low cardiac risk. Okay to proceed with prostate biopsy without any additional cardiac testing. Continue to hold Aspirin and Plavix.  After biopsy, -If malignant diagnosis and need for repeat surgeries/procedures, recommend resuming Aspirin 81 mg, hold 3-5 days before repeat procedure -If no malignancy found, recommend resuming Plavix 75 mg daily. -Regardless of malignancy or not, he would not need to be on dual antiplatelet therapy in the future.  Thanks MJP

## 2023-05-31 NOTE — Patient Instructions (Signed)
 Medication Instructions:   Your physician recommends that you continue on your current medications as directed. Please refer to the Current Medication list given to you today.  *If you need a refill on your cardiac medications before your next appointment, please call your pharmacy*     Follow-Up: At [facility], you and your health needs are our priority.  As part of our continuing mission to provide you with exceptional heart care, we have created designated Provider Care Teams.  These Care Teams include your primary Cardiologist (physician) and Advanced Practice Providers (APPs -  Physician Assistants and Nurse Practitioners) who all work together to provide you with the care you need, when you need it.  We recommend signing up for the patient portal called "MyChart".  Sign up information is provided on this After Visit Summary.  MyChart is used to connect with patients for Virtual Visits (Telemedicine).  Patients are able to view lab/test results, encounter notes, upcoming appointments, etc.  Non-urgent messages can be sent to your provider as well.   To learn more about what you can do with MyChart, go to [email or web address].    Your next appointment:   1 year(s)  Provider:   DR. Rosemary Holms   Other Instructions    1st Floor: - Lobby - Registration  - Pharmacy  - Lab - Cafe  2nd Floor: - PV Lab - Diagnostic Testing (echo, CT, nuclear med)  3rd Floor: - Vacant  4th Floor: - TCTS (cardiothoracic surgery) - AFib Clinic - Structural Heart Clinic - Vascular Surgery  - Vascular Ultrasound  5th Floor: - [facility] (general and EP) - Clinical Pharmacy for coumadin, hypertension, lipid, weight-loss medications, and med management appointments    Valet parking services will be available as well.

## 2023-05-31 NOTE — Telephone Encounter (Signed)
 Per test claim for icosapent Ethyl 1GM: Refill too soon. PA is not needed at this time. Medication was filled 05/16/23. Next eligible fill date is 06/08/23.

## 2023-05-31 NOTE — Telephone Encounter (Signed)
   Patient Name: [name]  DOB: 01/29/1961 MRN: [id number]  Primary Cardiologist: Lance Muss, MD  Chart reviewed as part of pre-operative protocol coverage. Given past medical history and time since last visit, based on ACC/AHA guidelines, [name] is at acceptable risk for the planned procedure without further cardiovascular testing.   Per Dr. Rosemary Holms, "Low cardiac risk. Okay to proceed with prostate biopsy without any additional cardiac testing. Continue to hold Aspirin and Plavix. After biopsy, -If malignant diagnosis and need for repeat surgeries/procedures, recommend resuming Aspirin 81 mg, hold 3-5 days before repeat procedure -If no malignancy found, recommend resuming Plavix 75 mg daily. -Regardless of malignancy or not, he would not need to be on dual antiplatelet therapy in the future.  Thanks MJP"  I will route this recommendation to the requesting party via Epic fax function and remove from pre-op pool.  Please call with questions.  Joylene Grapes, NP 05/31/2023, 3:44 PM

## 2023-05-31 NOTE — Progress Notes (Signed)
 Cardiology Office Note:  .   Date:  05/31/2023  ID:  [name], DOB Nov 13, 1960, MRN [id number] PCP: Octavia Heir, NP  [facility] Providers Cardiologist:  Truett Mainland, MD PCP: Octavia Heir, NP  Chief Complaint  Patient presents with   Coronary Artery Disease      History of Present Illness: [name] Kitchen    [name] is a 63 y.o. male with hyperlipidemia, type II DM, CAD, h/o MI and PCI's, ulcerative colitis, ankylosing spondylitis, hematuria  Patient is doing well from cardiac standpoint he denies any complaints of chest pain or shortness of breath with regular walking.  He has recently had hematuria for which he is undergoing workup.  He is scheduled to undergo prostate biopsy next week.  It appears that he had been taking aspirin and Plavix ever since his MI in 2021.  Aspirin and Plavix have been held by his urologist since Monday of this week.  He does notice that hematuria is slightly better since then.  Diabetes is uncontrolled, he is seeing PCP he for the same.  Vitals:   05/31/23 1432  BP: 115/70  Pulse: 100  Resp: 16  SpO2: 96%     ROS:  Review of Systems  Cardiovascular:  Negative for chest pain, dyspnea on exertion, leg swelling, palpitations and syncope.  Genitourinary:  Positive for hematuria.     Studies Reviewed: [name] Kitchen        EKG 05/31/2023: Sinus tachycardia with 1st degree A-V block with Premature atrial complexes Left anterior fascicular block When compared with ECG of 07-Aug-2020 10:39, Premature atrial complexes are now Present Left anterior fascicular block is now Present Incomplete right bundle branch block is no longer Present    Independently interpreted 02/2023: Chol 105, TG 92, HDL 40, LDL 47 HbA1C 8.2% Hb 14.2 Cr 0.92 Cardiac CRP 22, elevated ESR 47, elevated  Echocardiogram 2021: EF 55-60%. Akinesis of the left ventricular, apical septal wall  and inferior wall. There is akinesis of the left ventricular, apical   segment.   Coronary intervention 11/2019: 1.  Critical stenosis of the mid LAD and patient with anterior STEMI, treated successfully with PCI of the mid LAD using a drug-eluting stent (2.5 x 18 mm resolute Onyx) overlapped with the old bare-metal stent which remained patent. 2.  Severe first diagonal stenosis treated successfully with a 3.0 x 15 mm resolute Onyx DES 3.  Diffuse distal vessel coronary artery disease involving the LAD and PDA 4.  Mild nonobstructive left mainstem and left circumflex stenosis   Recommendations: 2D echo for LVEF assessment, aggressive medical therapy for secondary risk reduction, initially favor medical therapy for severe PDA stenosis because the vessel is diffusely diseased with multiple lesions throughout.  If refractory angina would be reasonable to treat the proximal PDA lesion with stenting.   Risk Assessment/Calculations:   RCRI score 1 Perioperative risk of major cardiac events 0.9%     Physical Exam:   Physical Exam Vitals and nursing note reviewed.  Constitutional:      General: He is not in acute distress. Neck:     Vascular: No JVD.  Cardiovascular:     Rate and Rhythm: Normal rate and regular rhythm.     Heart sounds: Normal heart sounds. No murmur heard. Pulmonary:     Effort: Pulmonary effort is normal.     Breath sounds: Normal breath sounds. No wheezing or rales.  Musculoskeletal:     Right lower leg: No edema.  Left lower leg: No edema.      VISIT DIAGNOSES:   ICD-10-CM   1. Pre-op evaluation  Z01.818 EKG 12-Lead    2. Coronary artery disease involving native coronary artery of native heart without angina pectoris  I25.10     3. Mixed hyperlipidemia  E78.2        ASSESSMENT AND PLAN: .    [name] is a 63 y.o. male with hyperlipidemia, type II DM, CAD, h/o MI and PCI's, ulcerative colitis, ankylosing spondylitis, hematuria  CAD, preop cardiac risk stratification: No anginal symptoms with good functional  capacity. I do not think he needs any further cardiac testing before his scheduled prostate biopsy next week. 3 years out of his MI and PCI, he does not need dual antiplatelet therapy. Continue to hold Aspirin and Plavix.  After biopsy, -If malignant diagnosis and need for repeat surgeries/procedures, recommend resuming Aspirin 81 mg, hold 3-5 days before repeat procedure -If no malignancy found, recommend resuming Plavix 75 mg daily. -Regardless of malignancy or not, he would not need to be on dual antiplatelet therapy in the future.  Okay to continue metoprolol. Continue Repatha, lipids very well-controlled.  Type 2 diabetes mellitus: Uncontrolled.  Recommend regular follow-up with PCP.     F/u in 1 year  Signed, Elder Negus, MD

## 2023-06-03 NOTE — Progress Notes (Signed)
 Anesthesia Chart Review   Case: [id number] Date/Time: 06/06/23 1330   Procedures:      TURBT (TRANSURETHRAL RESECTION OF BLADDER TUMOR) - BILATERAL RETROGRADE PYELOGRAM, POSSIBLE GEMCITABINE INSTILLATION     BIOPSY, PROSTATE, RECTAL APPROACH, WITH US GUIDANCE   Anesthesia type: General   Diagnosis:      Hematuria, gross [R31.0]     Gross hematuria [R31.0]     Elevated PSA [R97.20]   Pre-op diagnosis: GROSS HEMATURIA AND ELEVATED PSA   Location: [facility] PROCEDURE ROOM / [facility]   Surgeons: Adonis Brook, MD       DISCUSSION:63 y.o. never smoker with h/o asthma, ankylosing spondylitis,TMJ, CAD s/p bare metal stent to LAD 2008, DM II, gross hematuria, elevated PSA scheduled for above procedure 06/06/2023 with Dr. Vilma Prader.   Pt last seen by cardiology 05/31/2023. Per OV note, "No anginal symptoms with good functional capacity. I do not think he needs any further cardiac testing before his scheduled prostate biopsy next week. 3 years out of his MI and PCI, he does not need dual antiplatelet therapy. Continue to hold Aspirin and Plavix.  After biopsy, -If malignant diagnosis and need for repeat surgeries/procedures, recommend resuming Aspirin 81 mg, hold 3-5 days before repeat procedure -If no malignancy found, recommend resuming Plavix 75 mg daily. -Regardless of malignancy or not, he would not need to be on dual antiplatelet therapy in the future."  VS: Ht 5\' 8"  (1.727 m)   Wt 75.8 kg   BMI 25.39 kg/m   PROVIDERS: [place], Amy E, NP is PCP    LABS: Labs reviewed: Acceptable for surgery. (all labs ordered are listed, but only abnormal results are displayed)  Labs Reviewed - No data to display   IMAGES:   EKG:   CV: Echo 11/24/2019 1. Left ventricular ejection fraction, by estimation, is 55 to 60%. The  left ventricle has normal function. The left ventricle demonstrates  regional wall motion abnormalities (see scoring diagram/findings for  description). Left  ventricular diastolic  parameters are consistent with Grade I diastolic dysfunction (impaired  relaxation). There is akinesis of the left ventricular, apical septal wall  and inferior wall. There is akinesis of the left ventricular, apical  segment.   2. Right ventricular systolic function is normal. The right ventricular  size is normal.   3. The mitral valve is normal in structure. No evidence of mitral valve  regurgitation. No evidence of mitral stenosis.   4. The aortic valve is normal in structure. Aortic valve regurgitation is  trivial. No aortic stenosis is present.   5. The inferior vena cava is normal in size with greater than 50%  respiratory variability, suggesting right atrial pressure of 3 mmHg.  Past Medical History:  Diagnosis Date   Anemia    Ankylosing spondylitis (HCC)    Arthritis    Asthma    SINCE AGE 55   Colitis, ulcerative (HCC)    Coronary artery disease    Bare metal LAD stent 10/08   Coronary atherosclerosis of native coronary artery 12/25/2012   Diabetes mellitus without complication [facility])    ED (erectile dysfunction)    Elevated prostate specific antigen (PSA) 12/25/2012   Esophageal reflux 12/25/2012   Extrinsic asthma, unspecified 12/25/2012   Fibromyalgia    GERD (gastroesophageal reflux disease)    History of heart attack    Hypercalcemia 12/25/2012   Hyperlipidemia    Left sided ulcerative (chronic) colitis (HCC) 12/25/2012   Mixed hyperlipidemia 12/25/2012   Myocardial infarction (HCC)  Pneumonia    as a child   Pure hypercholesterolemia 12/25/2012   Skin cancer of eyelid    TMJ (dislocation of temporomandibular joint)     Past Surgical History:  Procedure Laterality Date   COLONOSCOPY     CORONARY/GRAFT ACUTE MI REVASCULARIZATION N/A 11/23/2019   Procedure: Coronary/Graft Acute MI Revascularization;  Surgeon: Tonny Bollman, MD;  Location: [facility] INVASIVE CV LAB;  Service: Cardiovascular;  Laterality: N/A;   EYE SURGERY Bilateral     EYE SURGERY     heart stent     LEFT HEART CATH AND CORONARY ANGIOGRAPHY N/A 11/23/2019   Procedure: LEFT HEART CATH AND CORONARY ANGIOGRAPHY;  Surgeon: Tonny Bollman, MD;  Location: [facility] INVASIVE CV LAB;  Service: Cardiovascular;  Laterality: N/A;   skin cancer removed from left eyelid      skull fracture after falling down stairs     TONSILLECTOMY      MEDICATIONS: No current facility-administered medications for this encounter.    albuterol (VENTOLIN HFA) 108 (90 Base) MCG/ACT inhaler   aspirin 81 MG tablet   clopidogrel (PLAVIX) 75 MG tablet   cyclobenzaprine (FLEXERIL) 5 MG tablet   dicyclomine (BENTYL) 20 MG tablet   empagliflozin (JARDIANCE) 10 MG TABS tablet   Ferrous Gluconate (IRON 27 PO)   gabapentin (NEURONTIN) 100 MG capsule   icosapent Ethyl (VASCEPA) 1 g capsule   lisinopril (ZESTRIL) 2.5 MG tablet   loratadine (CLARITIN) 10 MG tablet   metFORMIN (GLUCOPHAGE-XR) 500 MG 24 hr tablet   metoprolol succinate (TOPROL-XL) 50 MG 24 hr tablet   Multiple Vitamins-Minerals (CENTRUM CARDIO) TABS   nitroGLYCERIN (NITROSTAT) 0.4 MG SL tablet   pantoprazole (PROTONIX) 40 MG tablet   REPATHA SURECLICK 140 MG/ML SOAJ   sulfamethoxazole-trimethoprim (BACTRIM DS) 800-160 MG tablet   sulfaSALAzine (AZULFIDINE) 500 MG EC tablet   theophylline (THEODUR) 300 MG 12 hr tablet   vedolizumab (ENTYVIO) 300 MG injection    [facility], PA-C [facility] Pre-Surgical Testing [phone]

## 2023-06-04 NOTE — H&P (Incomplete)
 63 year old male with history of elevated PSA. Status post negative MRI in 2017 negative biopsy in 2011 patient was last seen in 2019 for plan for repeat PSA patient never followed up. Patient is having gross hematuria today. PSA in 2022 10.2.. Cr 0.92   PMH: GERD, HLD, TMJ, CAD, MI, on notroglyucerin and plavix MI, 2021 and 2008.   GH:  05/27/23: GH is getting worse, pain with urination, occasionally seems more frequent, pt is seeing some clots in the urine. Pt had GH 3 months. 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 PVR 331. Cysot today unable to see tumor poor visitbility. Offered to place catheter and irrigate pateint did not want to have it placed.   Elevated PSA:  05/27/23: no FH of PCa, no GYN cancer or breast cancer, no recent abx. Pt has never stopped Plavix. PSA in 2022 10.2 significantly increased WIll get PSA today. Strong FH hx of cancers   06/06/23: here today for cysto eval, clot evac, and prostate bx.     ALLERGIES: No Allergies    MEDICATIONS: Aspirin  Allergy  Centrum Cardio Oral Tablet Oral  Clopidogrel 75 mg tablet 1 tablet PO Daily  Fish Oil CAPS Oral  Iron  Prednisone  SulfaSALAzine TABS Oral  Theophylline ER TB24 Oral     GU PSH: No GU PSH      PSH Notes: Tonsillectomy, Skull Repair, Cath Stent Placement, Eye Surgery for removal of a skin cancer with Mohs approach in 1/16 and 5/17   Prostate biopsy in 2012.   NON-GU PSH: Eye Surgery (Unspecified) Remove Tonsils - 2011         GU PMH: Elevated PSA (Worsening), His PSA is up to 7.54 but it has been over 7 in the past. I am going to get a repeat PSA with a week of abstinence and if that is down or stable have him return in 6 months with a PSA. - 2019, (Worsening), His PSA is up to 5.92., - 2017 (Worsening), His PSA is up about 1.6 points above the level prior to his last prostate biopsy., - 2017, Elevated prostate specific antigen (PSA), - 2014 BPH w/o LUTS, Benign prostatic hypertrophy without lower urinary tract  symptoms - 2014      PMH Notes:  1898-03-19 00:00:00 - Note: Normal Routine History And Physical Adult  2010-01-09 11:49:25 - Note: Coronary Artery Disease  2010-01-09 11:22:23 - Note: Arthritis   NON-GU PMH: Ankylosing spondylitis of unspecified sites in spine, Ankylosing Spondylitis - 2014 Asthma, Asthma - 2014 Fibromyalgia, Fibromyalgia - 2014 Personal history of other diseases of the digestive system, History of ulcerative colitis - 2014, History of esophageal reflux, - 2014 Personal history of other endocrine, nutritional and metabolic disease, History of hypercholesterolemia - 2014 Arthritis Asthma Basal cell carcinoma of skin, unspecified GERD Ulcerative colitis, unspecified with unspecified complications    FAMILY HISTORY: Death In The Family Father - Runs In Family Family Health Status Number - Runs In Family Lung Cancer - Father   SOCIAL HISTORY: Marital Status: Married Preferred Language: English; Ethnicity: Not Hispanic Or Latino; Race: White Current Smoking Status: Patient has never smoked.  Drinks 2 drinks per day.  Does not drink caffeine.     Notes: Never A Smoker, Occupation:, Marital History - Currently Married, Caffeine Use, Alcohol Use, Tobacco Use   REVIEW OF SYSTEMS:     GU Review Male:  Patient denies frequent urination, hard to postpone urination, burning/ pain with urination, get up at night to urinate, leakage of urine,  stream starts and stops, trouble starting your stream, have to strain to urinate , erection problems, and penile pain.    Gastrointestinal (Upper):  Patient denies nausea, vomiting, and indigestion/ heartburn.    Gastrointestinal (Lower):  Patient denies diarrhea and constipation.    Constitutional:  Patient denies fever, night sweats, weight loss, and fatigue.    Skin:  Patient denies skin rash/ lesion and itching.    Eyes:  Patient denies blurred vision and double vision.    Ears/ Nose/ Throat:  Patient denies sore throat and sinus  problems.    Hematologic/Lymphatic:  Patient denies swollen glands and easy bruising.    Cardiovascular:  Patient denies leg swelling and chest pains.    Respiratory:  Patient denies cough and shortness of breath.    Endocrine:  Patient denies excessive thirst.    Musculoskeletal:  Patient denies back pain and joint pain.    Neurological:  Patient denies headaches and dizziness.    Psychologic:  Patient denies depression and anxiety.    VITAL SIGNS:       05/27/2023 03:01 PM     BP 97/71 mmHg     Pulse 106 /min     Temperature 97.3 F / 36.2 C     GU PHYSICAL EXAMINATION:      Prostate: Enlarge prostate smooth, no nodules, slight right sided hypertrophy      MULTI-SYSTEM PHYSICAL EXAMINATION:      Constitutional: Well-nourished. No physical deformities. Normally developed. Good grooming.     Respiratory: No labored breathing, no use of accessory muscles.      Cardiovascular: Normal temperature, normal extremity pulses, no swelling, no varicosities.     Neurologic / Psychiatric: Oriented to time, oriented to place, oriented to person. No depression, no anxiety, no agitation.     Musculoskeletal: Normal gait and station of head and neck.            Complexity of Data:   Records Review:  Previous Patient Records  Urine Test Review:  Urinalysis  Urodynamics Review:  Review Bladder Scan    11/14/17 07/29/17 01/11/16 10/17/15 01/03/11 07/03/10 01/09/10  PSA  Total PSA 5.33 ng/mL 7.54 ng/dl 5.36  6.44  0.34  7.42  5.42   Free PSA 0.87 ng/mL  0.55  0.61   0.3  1.2   % Free PSA 16 % PSA 16.4 % 9  11   7  22     Notes:  PVR 33   PROCEDURES:    Flexible Cystoscopy - 52000  Risks, benefits, and some of the potential complications of the procedure were discussed at length with the patient including infection, bleeding, voiding discomfort, urinary retention, fever, chills, sepsis, and others. All questions were answered. Informed consent was obtained. Antibiotic prophylaxis was given.  Sterile technique and intraurethral analgesia were used.  Meatus:  Normal size. Normal location. Normal condition.  Urethra:  No strictures.  External Sphincter:  Normal.  Verumontanum:  Normal.  Prostate:  Enlarge prostate   Bladder Neck:  Non-obstructing.  Ureteral Orifices:  Normal location. Normal size. Normal shape. Effluxed clear urine.  Bladder:  Difficult visualization within the bladder due to bleeding unable to visualize any tumors did see a possibility of tumor on the lower left side near the trigone.      The lower urinary tract was carefully examined. The procedure was well-tolerated and without complications. Antibiotic instructions were given. Instructions were given to call the office immediately for bloody urine, difficulty urinating, urinary retention, painful or frequent  urination, fever, chills, nausea, vomiting or other illness. The patient stated that he understood these instructions and would comply with them.       PVR Ultrasound - 36644  Scanned Volume: 331 cc    Urinalysis w/Scope  Dipstick Dipstick Cont'd Micro  Color: Red Bilirubin: Invalid mg/dL WBC/hpf: 6 - 03/KVQ  Appearance: Cloudy Ketones: Invalid mg/dL RBC/hpf: >25/ZDG  Specific Gravity: Invalid Blood: Invalid ery/uL Bacteria: Many (>50/hpf)  pH: Invalid Protein: Invalid mg/dL Cystals: NS (Not Seen)  Glucose: Invalid mg/dL Urobilinogen: Invalid mg/dL Casts: NS (Not Seen)   Nitrites: Invalid Trichomonas: Not Present   Leukocyte Esterase: Invalid leu/uL Mucous: Not Present    Epithelial Cells: NS (Not Seen)    Yeast: NS (Not Seen)    Sperm: Not Present   Notes: micro performed on unspun urine Tests invalid due to color interference     ASSESSMENT:     ICD-10 Details  1 GU:  Gross hematuria - R31.0   2  Elevated PSA - R97.20    PLAN:   Medications  New Meds: Bactrim Ds 800 mg-160 mg tablet 1 tablet PO As Directed Take 1 pill on morning of OR biopsy then 1 pill that night after biopsy #2 0  Refill(s)  Cephalexin 500 mg capsule 1 capsule PO BID #20 0 Refill(s)     Pharmacy Name:  [id number]    Address:  [address]     [place], [place] [id number]    Phone:  [phone]    Fax:  [phone]   Orders  Labs Urine Culture, CBC with Diff, BMP, PSA  Document  Letter(s):  Created for Patient: Clinical Summary   Notes:  Elevated PSA: PSA in 2022 10.22 we will repeat repeat PSA today plan for prostate biopsy in the OR as long as PSA is still elevated on repeat PSA today. Patient is not been on antibiotics we will give gentamicin in the OR.   We discussed risk benefits and alternatives to prostate biopsy. Benefits discussed including diagnosis of prostate cancer, and possibility of false negative result. We discussed risks which include gross hematuria, blood in the stool, hematospermia, and general lower urinary tract symptoms. We also discussed the more severe complications including infection requiring hospital admission and bleeding requiring transfusion. We discussed the risk of these are around 2%. The patient voiced her understanding and would like to proceed with the procedure.    GH:  cystosocpy did not see tumor due to difficult visualization. Due to patient's recent gross hematuria episodes will plan to do cystoscopy in the OR possible biopsy. Will prescribe antibiotics today urine culture ordered as well as labs.  Discussed placing catheter today patient did not want it placed. Will hold plavix for the time being. Gave return precautions if unable to void.   Will reach out to cardiologist to get clearance. Cardiologist is Lance Muss.   We discussed risk benefits alternatives to transurethral resection of bladder tumor. Benefits are diagnostic and therapeutic which include removal of bladder tumor and relieve the possible irritative symptoms from bladder tumor. We discussed risk including not being able to remove all of the tumor, possibility  of perforation of the bladder requiring long-term catheter or operative intervention to repair. The need for catheter postoperatively was also discussed, as well as postoperative lower urinary tract symptoms in the immediate postop period. Patient voiced their understanding and would like to proceed with the surgery.    Here today for the procedures above  will proceed

## 2023-06-05 ENCOUNTER — Ambulatory Visit: Payer: BC Managed Care – PPO

## 2023-06-06 ENCOUNTER — Encounter (HOSPITAL_COMMUNITY): Payer: Self-pay | Admitting: Urology

## 2023-06-06 ENCOUNTER — Ambulatory Visit (HOSPITAL_COMMUNITY)
Admission: RE | Admit: 2023-06-06 | Discharge: 2023-06-06 | Disposition: A | Discharge status: Home / Self Care | Source: Ambulatory Visit | Attending: Urology | Admitting: Urology

## 2023-06-06 ENCOUNTER — Ambulatory Visit (HOSPITAL_COMMUNITY)
Admission: RE | Admit: 2023-06-06 | Discharge: 2023-06-06 | Disposition: A | Discharge status: Home / Self Care | Attending: Urology | Admitting: Urology

## 2023-06-06 ENCOUNTER — Ambulatory Visit (HOSPITAL_COMMUNITY)

## 2023-06-06 ENCOUNTER — Encounter (HOSPITAL_COMMUNITY)
Admission: RE | Disposition: A | Discharge status: Home / Self Care | Payer: Self-pay | Source: Home / Self Care | Attending: Urology

## 2023-06-06 ENCOUNTER — Ambulatory Visit (HOSPITAL_COMMUNITY): Payer: Self-pay | Admitting: Physician Assistant

## 2023-06-06 ENCOUNTER — Other Ambulatory Visit: Payer: Self-pay

## 2023-06-06 DIAGNOSIS — E119 Type 2 diabetes mellitus without complications: Secondary | ICD-10-CM | POA: Diagnosis not present

## 2023-06-06 DIAGNOSIS — R130 Aphagia: Secondary | ICD-10-CM | POA: Diagnosis not present

## 2023-06-06 DIAGNOSIS — D649 Anemia, unspecified: Secondary | ICD-10-CM | POA: Diagnosis not present

## 2023-06-06 DIAGNOSIS — Z8711 Personal history of peptic ulcer disease: Secondary | ICD-10-CM | POA: Diagnosis not present

## 2023-06-06 DIAGNOSIS — N210 Calculus in bladder: Secondary | ICD-10-CM | POA: Insufficient documentation

## 2023-06-06 DIAGNOSIS — I252 Old myocardial infarction: Secondary | ICD-10-CM | POA: Insufficient documentation

## 2023-06-06 DIAGNOSIS — N421 Congestion and hemorrhage of prostate: Secondary | ICD-10-CM | POA: Diagnosis not present

## 2023-06-06 DIAGNOSIS — N4232 Atypical small acinar proliferation of prostate: Secondary | ICD-10-CM | POA: Diagnosis not present

## 2023-06-06 DIAGNOSIS — R310 Gross hematuria: Secondary | ICD-10-CM | POA: Diagnosis not present

## 2023-06-06 DIAGNOSIS — J45909 Unspecified asthma, uncomplicated: Secondary | ICD-10-CM | POA: Insufficient documentation

## 2023-06-06 DIAGNOSIS — Z7902 Long term (current) use of antithrombotics/antiplatelets: Secondary | ICD-10-CM | POA: Insufficient documentation

## 2023-06-06 DIAGNOSIS — Z7984 Long term (current) use of oral hypoglycemic drugs: Secondary | ICD-10-CM | POA: Insufficient documentation

## 2023-06-06 DIAGNOSIS — I10 Essential (primary) hypertension: Secondary | ICD-10-CM | POA: Diagnosis not present

## 2023-06-06 DIAGNOSIS — Z7982 Long term (current) use of aspirin: Secondary | ICD-10-CM | POA: Diagnosis not present

## 2023-06-06 DIAGNOSIS — Z955 Presence of coronary angioplasty implant and graft: Secondary | ICD-10-CM | POA: Insufficient documentation

## 2023-06-06 DIAGNOSIS — M797 Fibromyalgia: Secondary | ICD-10-CM | POA: Diagnosis not present

## 2023-06-06 DIAGNOSIS — N3289 Other specified disorders of bladder: Secondary | ICD-10-CM | POA: Diagnosis not present

## 2023-06-06 DIAGNOSIS — I2510 Atherosclerotic heart disease of native coronary artery without angina pectoris: Secondary | ICD-10-CM | POA: Insufficient documentation

## 2023-06-06 DIAGNOSIS — Z79899 Other long term (current) drug therapy: Secondary | ICD-10-CM | POA: Insufficient documentation

## 2023-06-06 DIAGNOSIS — C61 Malignant neoplasm of prostate: Secondary | ICD-10-CM | POA: Diagnosis not present

## 2023-06-06 DIAGNOSIS — R972 Elevated prostate specific antigen [PSA]: Secondary | ICD-10-CM

## 2023-06-06 DIAGNOSIS — N400 Enlarged prostate without lower urinary tract symptoms: Secondary | ICD-10-CM | POA: Diagnosis not present

## 2023-06-06 DIAGNOSIS — K219 Gastro-esophageal reflux disease without esophagitis: Secondary | ICD-10-CM | POA: Diagnosis not present

## 2023-06-06 DIAGNOSIS — N323 Diverticulum of bladder: Secondary | ICD-10-CM | POA: Diagnosis not present

## 2023-06-06 HISTORY — PX: PROSTATE BIOPSY: SHX241

## 2023-06-06 HISTORY — PX: TRANSURETHRAL RESECTION OF BLADDER TUMOR: SHX2575

## 2023-06-06 HISTORY — DX: Type 2 diabetes mellitus without complications: E11.9

## 2023-06-06 HISTORY — DX: Acute myocardial infarction, unspecified: I21.9

## 2023-06-06 HISTORY — DX: Malignant neoplasm of prostate: C61

## 2023-06-06 LAB — GLUCOSE, CAPILLARY
Glucose-Capillary: 127 mg/dL — ABNORMAL HIGH (ref 70–99)
Glucose-Capillary: 123 mg/dL — ABNORMAL HIGH (ref 70–99)

## 2023-06-06 LAB — BASIC METABOLIC PANEL
Anion gap: 11 (ref 5–15)
BUN: 20 mg/dL (ref 8–23)
CO2: 20 mmol/L — ABNORMAL LOW (ref 22–32)
Calcium: 9.7 mg/dL (ref 8.9–10.3)
Chloride: 105 mmol/L (ref 98–111)
Creatinine, Ser: 0.75 mg/dL (ref 0.61–1.24)
GFR, Estimated: >60 mL/min (ref >60)
Glucose, Bld: 128 mg/dL — ABNORMAL HIGH (ref 70–99)
Potassium: 3.9 mmol/L (ref 3.5–5.1)
Sodium: 136 mmol/L (ref 135–145)

## 2023-06-06 LAB — HEMOGLOBIN A1C
Hgb A1c MFr Bld: 5.6 % (ref 4.8–5.6)
Mean Plasma Glucose: 114.02 mg/dL

## 2023-06-06 SURGERY — TURBT (TRANSURETHRAL RESECTION OF BLADDER TUMOR)
Anesthesia: General

## 2023-06-06 MED ORDER — FENTANYL CITRATE PF 50 MCG/ML IJ SOSY
PREFILLED_SYRINGE | INTRAMUSCULAR | Status: AC
  Filled 2023-06-06: qty 1

## 2023-06-06 MED ORDER — SODIUM CHLORIDE 0.9 % IR SOLN
Status: DC | PRN
  Administered 2023-06-06: 15000 mL

## 2023-06-06 MED ORDER — OXYCODONE HCL 5 MG PO TABS
ORAL_TABLET | ORAL | Status: AC
  Filled 2023-06-06: qty 1

## 2023-06-06 MED ORDER — FENTANYL CITRATE PF 50 MCG/ML IJ SOSY
PREFILLED_SYRINGE | INTRAMUSCULAR | Status: DC
Start: 2023-06-06 — End: 2023-06-07
  Filled 2023-06-06: qty 1

## 2023-06-06 MED ORDER — GENTAMICIN SULFATE 40 MG/ML IJ SOLN
400.0000 mg | INTRAVENOUS | Status: AC
  Administered 2023-06-06: 400 mg via INTRAVENOUS
  Filled 2023-06-06: qty 10

## 2023-06-06 MED ORDER — MIDAZOLAM HCL 2 MG/2ML IJ SOLN
INTRAMUSCULAR | Status: AC
  Filled 2023-06-06: qty 2

## 2023-06-06 MED ORDER — LIDOCAINE HCL URETHRAL/MUCOSAL 2 % EX GEL
CUTANEOUS | Status: AC
  Filled 2023-06-06: qty 30

## 2023-06-06 MED ORDER — ONDANSETRON HCL 4 MG/2ML IJ SOLN
INTRAMUSCULAR | Status: DC | PRN
  Administered 2023-06-06: 4 mg via INTRAVENOUS

## 2023-06-06 MED ORDER — CHLORHEXIDINE GLUCONATE 0.12 % MT SOLN
15.0000 mL | Freq: Once | OROMUCOSAL | Status: AC
  Administered 2023-06-06: 15 mL via OROMUCOSAL

## 2023-06-06 MED ORDER — LIDOCAINE HCL (CARDIAC) PF 100 MG/5ML IV SOSY
PREFILLED_SYRINGE | INTRAVENOUS | Status: DC | PRN
  Administered 2023-06-06: 80 mg via INTRAVENOUS

## 2023-06-06 MED ORDER — DEXMEDETOMIDINE HCL IN NACL 200 MCG/50ML IV SOLN
INTRAVENOUS | Status: DC | PRN
  Administered 2023-06-06: 8 ug via INTRAVENOUS

## 2023-06-06 MED ORDER — METHOCARBAMOL 750 MG PO TABS: 750.0000 mg | ORAL_TABLET | Freq: Four times a day (QID) | ORAL | 0 refills | Status: AC

## 2023-06-06 MED ORDER — FENTANYL CITRATE (PF) 100 MCG/2ML IJ SOLN
INTRAMUSCULAR | Status: AC
  Filled 2023-06-06: qty 2

## 2023-06-06 MED ORDER — ACETAMINOPHEN 10 MG/ML IV SOLN
INTRAVENOUS | Status: AC
  Filled 2023-06-06: qty 100

## 2023-06-06 MED ORDER — SODIUM CHLORIDE 0.9 % IV SOLN
1.0000 g | INTRAVENOUS | Status: AC
  Administered 2023-06-06: 1 g via INTRAVENOUS
  Filled 2023-06-06: qty 1000

## 2023-06-06 MED ORDER — IOHEXOL 300 MG/ML  SOLN
INTRAMUSCULAR | Status: DC | PRN
  Administered 2023-06-06: 23 mL

## 2023-06-06 MED ORDER — PROPOFOL 10 MG/ML IV BOLUS
INTRAVENOUS | Status: DC | PRN
  Administered 2023-06-06: 150 mg via INTRAVENOUS

## 2023-06-06 MED ORDER — STERILE WATER FOR INJECTION IJ SOLN
INTRAMUSCULAR | Status: AC
  Filled 2023-06-06: qty 30

## 2023-06-06 MED ORDER — ACETAMINOPHEN 10 MG/ML IV SOLN
1000.0000 mg | Freq: Once | INTRAVENOUS | Status: DC | PRN
  Administered 2023-06-06: 1000 mg via INTRAVENOUS

## 2023-06-06 MED ORDER — LACTATED RINGERS IV SOLN: INTRAVENOUS | Status: DC

## 2023-06-06 MED ORDER — ROCURONIUM BROMIDE 100 MG/10ML IV SOLN
INTRAVENOUS | Status: DC | PRN
  Administered 2023-06-06: 50 mg via INTRAVENOUS

## 2023-06-06 MED ORDER — MIDAZOLAM HCL 5 MG/5ML IJ SOLN
INTRAMUSCULAR | Status: DC | PRN
  Administered 2023-06-06: 2 mg via INTRAVENOUS

## 2023-06-06 MED ORDER — HYOSCYAMINE SULFATE 0.125 MG PO TBDP: 0.1250 mg | ORAL_TABLET | Freq: Four times a day (QID) | ORAL | 0 refills | Status: DC | PRN

## 2023-06-06 MED ORDER — DEXAMETHASONE SODIUM PHOSPHATE 10 MG/ML IJ SOLN
INTRAMUSCULAR | Status: DC | PRN
  Administered 2023-06-06: 5 mg via INTRAVENOUS

## 2023-06-06 MED ORDER — FENTANYL CITRATE PF 50 MCG/ML IJ SOSY
25.0000 ug | PREFILLED_SYRINGE | INTRAMUSCULAR | Status: DC | PRN
  Administered 2023-06-06 (×3): 50 ug via INTRAVENOUS

## 2023-06-06 MED ORDER — LIDOCAINE HCL 2 % IJ SOLN
INTRAMUSCULAR | Status: AC
  Filled 2023-06-06: qty 20

## 2023-06-06 MED ORDER — LIDOCAINE HCL 2 % IJ SOLN
INTRAMUSCULAR | Status: DC | PRN
  Administered 2023-06-06: 7 mL

## 2023-06-06 MED ORDER — INSULIN ASPART 100 UNIT/ML IJ SOLN: 0.0000 [IU] | INTRAMUSCULAR | Status: DC | PRN

## 2023-06-06 MED ORDER — POLYETHYLENE GLYCOL 3350 17 G PO PACK: 17.0000 g | PACK | Freq: Every day | ORAL | 0 refills | Status: DC

## 2023-06-06 MED ORDER — OXYCODONE HCL 5 MG PO TABS
5.0000 mg | ORAL_TABLET | Freq: Once | ORAL | Status: AC | PRN
  Administered 2023-06-06: 5 mg via ORAL

## 2023-06-06 MED ORDER — ORAL CARE MOUTH RINSE: 15.0000 mL | Freq: Once | OROMUCOSAL | Status: AC

## 2023-06-06 MED ORDER — TAMSULOSIN HCL 0.4 MG PO CAPS: 0.4000 mg | ORAL_CAPSULE | Freq: Every day | ORAL | 0 refills | Status: DC

## 2023-06-06 MED ORDER — FENTANYL CITRATE (PF) 100 MCG/2ML IJ SOLN
INTRAMUSCULAR | Status: DC | PRN
  Administered 2023-06-06: 100 ug via INTRAVENOUS

## 2023-06-06 MED ORDER — LACTATED RINGERS IV SOLN: INTRAVENOUS | Status: DC | PRN

## 2023-06-06 MED ORDER — PHENAZOPYRIDINE HCL 200 MG PO TABS: 200.0000 mg | ORAL_TABLET | Freq: Three times a day (TID) | ORAL | 0 refills | Status: DC | PRN

## 2023-06-06 MED ORDER — OXYCODONE HCL 5 MG/5ML PO SOLN: 5.0000 mg | Freq: Once | ORAL | Status: AC | PRN

## 2023-06-06 MED ORDER — SUGAMMADEX SODIUM 200 MG/2ML IV SOLN
INTRAVENOUS | Status: DC | PRN
  Administered 2023-06-06: 200 mg via INTRAVENOUS

## 2023-06-06 SURGICAL SUPPLY — 24 items
BAG COUNTER SPONGE SURGICOUNT (BAG) IMPLANT
BAG URINE DRAIN 2000ML AR STRL (UROLOGICAL SUPPLIES) IMPLANT
BAG URO CATCHER STRL LF (MISCELLANEOUS) ×2 IMPLANT
CATH FOLEY 3WAY 30CC 22FR (CATHETERS) IMPLANT
CATH URETL OPEN 5X70 (CATHETERS) IMPLANT
COVER SURGICAL LIGHT HANDLE (MISCELLANEOUS) ×2 IMPLANT
DRAPE FOOT SWITCH (DRAPES) ×2 IMPLANT
ELECT REM PT RETURN 15FT ADLT (MISCELLANEOUS) ×2 IMPLANT
GLOVE SURG LX STRL 8.0 MICRO (GLOVE) ×2 IMPLANT
GOWN STRL REUS W/ TWL XL LVL3 (GOWN DISPOSABLE) ×2 IMPLANT
GUIDEWIRE STR DUAL SENSOR (WIRE) IMPLANT
INST BIOPSY MAXCORE 18GX25 (NEEDLE) IMPLANT
INSTR BIOPSY MAXCORE 18GX20 (NEEDLE) IMPLANT
KIT TURNOVER KIT A (KITS) IMPLANT
LOOP CUT BIPOLAR 24F LRG (ELECTROSURGICAL) IMPLANT
MANIFOLD NEPTUNE II (INSTRUMENTS) ×2 IMPLANT
NDL SAFETY ECLIPSE 18X1.5 (NEEDLE) ×2 IMPLANT
PACK CYSTO (CUSTOM PROCEDURE TRAY) ×2 IMPLANT
SYR CONTROL 10ML LL (SYRINGE) IMPLANT
SYR TOOMEY IRRIG 70ML (MISCELLANEOUS) IMPLANT
SYRINGE TOOMEY IRRIG 70ML (MISCELLANEOUS) IMPLANT
TUBING CONNECTING 10 (TUBING) ×2 IMPLANT
TUBING UROLOGY SET (TUBING) ×2 IMPLANT
UNDERPAD 30X36 HEAVY ABSORB (UNDERPADS AND DIAPERS) ×2 IMPLANT

## 2023-06-06 NOTE — Op Note (Signed)
 Operative Note  Preoperative diagnosis:  1.  Gross hematuria and elevated PSA  Postoperative diagnosis: 1.  Gross hematuria likely from the prostate and erythematous area near the left ureter and bladder stone.  Elevated PSA  Procedure(s): 1.  Removal of bladder stone 2.  Bilateral retrograde pyelogram 3.  Right ureteroscopy diagnostic cytology obtained. 4.  Bladder biopsy near left ureteral orifice 5.  Transurethral resection of prostate removal of median lobe. 6.  Prostate biopsy 7.  Urethral dilation to 28 [place]  Surgeon: Vilma Prader MD  Assistants:  None  Anesthesia:  General  Complications:  None  EBL: 20 cc  Specimens: 1.  Right ureteral cytology 2.  Bladder biopsy 3.  Prostate chips 4.  Prostate biopsy  Drains/Catheters: 1.  22 French three-way catheter 30 cc in the balloon on CBI  Intraoperative findings:   Significantly large bladder with multiple diverticuli large diverticuli on the left having old clot Erythematous area surrounding the left ureteral orifice Bladder stone 1 cm in size removed via scope Retrograde pyelogram the right demonstrating irregular filling defects Right ureteroscopy demonstrated no filling defects within the ureter or irregular mucosa Area just medial to the erythematous area near the left ureteral wrist biopsied and fulgurated Significant bleeding coming from the median lobe this was resected to control bleeding. Standard templated prostate biopsy completed   Bilateral retrograde pyelogram interpretation: Left retrograde pyelogram showed no filling defects normal ureter and drained appropriately Right retrograde pyelogram demonstrated concern for possible filling defect eventual ureteroscopy showed no filling defect.   Indication:  [name] is a 63 y.o. male with gross hematuria of undetermined cause as well as elevated PSA.  Patient stopped his Plavix prior to this procedure.  All the risks, benefits were  discussed with the patient to include but not limited to infection, pain, bleeding, damage to adjacent structures, need for further operations, adverse reaction to anesthesia and death.  Patient understands these risks and agrees to proceed with the operation as planned.    Description of procedure: After informed consent was obtained from the patient, the patient was taken to the operating room. General anesthesia was administered. The patient was placed in dorsal lithotomy position and prepped and draped in usual sterile fashion. Sequential compression devices were applied to lower extremities at the beginning of the case for DVT prophylaxis. Antibiotics were infused prior to surgery start time. A surgical time-out was performed to properly identify the patient, the surgery to be performed, and the surgical site.     The 22 French cystoscope was advanced into the urethra and noted that the prostatic urethra was obstructed with a large median lobe the pendulous and bulbar urethra had no abnormalities noted.  The bladder pan cystoscopy performed there were noted significant diverticuli but no abnormal tumors noted though there was a large diverticuli on the left side the bladder which had old clot that was evacuated.  There also was a 1 cm stone noted in the base of the bladder.  After pan cystoscopy was done the left ureteral orifice was identified and a sensor wire was placed in this under fluoroscopy and then a retrograde pyelogram was formed in the left side near the left ear orifice there is noted significant erythema but the retrograde pyelogram demonstrated no abnormalities.  Next the right side ureteral orifice was identified a sensor wires placed in this and a 5 [place] Pollick catheter was advanced at this to do a retrograde pyelogram of note there were some filling defects  in the right side to the decision was made to use the semirigid ureteroscope to perform ureteroscopy.  Next a single-lumen  semirigid ureteroscope was advanced in the bladder and advanced to the right ureter there were no mucosal abnormalities or other abnormalities within the ureter.  Cytology was taken from this right ureter.  After this was completed the resectoscope was attempted to advance into the bladder this was unable to do so the so the meatus had to be dilated to 28 [place] using [employer] dilators.  Once this was done the resectoscope Faylene Million in the bladder the clot from the diverticuli on the left side was evacuated and then the stone was removed.  Once this was done a biopsy was taken to the area just medial that was erythematous near the left ureteral orifice make sure not to include the left ureteral orifice.  This area was then fulgurated this was done there was significant bleeding in the bladder still this is all coming from the median lobe the prostate so the decision was made to resect the median lobe of the prostate.  The median lobe was resected down to the level of the bladder mucosa and then fulgurated so that was no longer bleeding all chips were evacuated and sent for pathology.  Once this was done reinspection of bleeding there was no appreciable bleeding and the confirmed all that she was removed then a 22 [place] three-way catheter with 30 cc balloon was placed this irrigated to make sure no chips were done was placed on CBI.  After this was done the patient was undraped and a prostate biopsy was performed first the prostate was sized to 40 g then it was given lidocaine on either side underneath the SV and the prostate and then biopsy was taken in sequential form including 6 sides of the right 6 sides and a left these all were sent for pathology the scope the ultrasound probe was then removed and the evaluation noted that there was no significant bleeding.    The patient was then extubated biopsy operative suite and taken the PACU in stable condition.  He remain on CBI and this will be clamped to see if he  remains clear then can be discharged home.  Plan:   Patient will be sent to the PACU and CBI this will be clamped eventually if he remains clear he will be discharged home.  Plan to void trial in 1 week patient remain off Plavix until that time.  Vilma Prader  [facility]

## 2023-06-06 NOTE — Anesthesia Procedure Notes (Signed)
 Procedure Name: Intubation Date/Time: 06/06/2023 3:33 PM  Performed by: Deri Fuelling, CRNAPre-anesthesia Checklist: Patient identified, Emergency Drugs available, Suction available and Patient being monitored Patient Re-evaluated:Patient Re-evaluated prior to induction Oxygen Delivery Method: Circle system utilized Preoxygenation: Pre-oxygenation with 100% oxygen Induction Type: IV induction Ventilation: Mask ventilation without difficulty Laryngoscope Size: Glidescope and 4 Tube type: Oral Tube size: 7.5 mm Number of attempts: 1 Airway Equipment and Method: Stylet and Oral airway Placement Confirmation: ETT inserted through vocal cords under direct vision, positive ETCO2 and breath sounds checked- equal and bilateral Secured at: 22 cm Tube secured with: Tape Dental Injury: Teeth and Oropharynx as per pre-operative assessment  Difficulty Due To: Difficulty was anticipated and Difficult Airway- due to reduced neck mobility

## 2023-06-06 NOTE — Transfer of Care (Signed)
 Immediate Anesthesia Transfer of Care Note  Patient: [name]  Procedure(s) Performed: TURBT (TRANSURETHRAL RESECTION OF BLADDER TUMOR) BIOPSY, PROSTATE, RECTAL APPROACH, WITH US GUIDANCE  Patient Location: PACU  Anesthesia Type:General  Level of Consciousness: awake and alert   Airway & Oxygen Therapy: Patient Spontanous Breathing and Patient connected to face mask oxygen  Post-op Assessment: Report given to RN and Post -op Vital signs reviewed and stable  Post vital signs: Reviewed and stable  Last Vitals:  Vitals Value Taken Time  BP 144/98 06/06/23 1709  Temp    Pulse 81 06/06/23 1711  Resp 18 06/06/23 1711  SpO2 100 % 06/06/23 1711  Vitals shown include unfiled device data.  Last Pain:  Vitals:   06/06/23 1223  TempSrc:   PainSc: 4          Complications:  Encounter Notable Events  Notable Event Outcome Phase Comment  Difficult to intubate - expected  Intraprocedure Filed from anesthesia note documentation.

## 2023-06-06 NOTE — Discharge Instructions (Addendum)
 Transurethral Resection of Bladder tumor and prostate   Care After  Refer to this sheet in the next few weeks. These discharge instructions provide you with general information on caring for yourself after you leave the hospital. Your caregiver may also give you specific instructions. Your treatment has been planned according to the most current medical practices available, but unavoidable complications sometimes occur. If you have any problems or questions after discharge, please call your caregiver.  HOME CARE INSTRUCTIONS   Medications You may receive medicine for pain management. As your level of discomfort decreases, adjustments in your pain medicines may be made.  Take all medicines as directed.  You may be given a medicine (antibiotic) to kill germs following surgery. Finish all medicines. Let your caregiver know if you have any side effects or problems from the medicine.  If you are on aspirin, it would be best not to restart the aspirin until the blood in the urine clears DO not restart Plavix until Catheter is removed  Hygiene You can take a shower after surgery.  You should not take a bath while you still have the urethral catheter. Activity You will be encouraged to get out of bed as much as possible and increase your activity level as tolerated.  Spend the first week in and around your home. For 3 weeks, avoid the following:  Straining.  Running.  Strenuous work.  Walks longer than a few blocks.  Riding for extended periods.  Sexual relations.  Do not lift heavy objects (more than 10 pounds) for at least 1 month. When lifting, use your arms instead of your abdominal muscles.  You will be encouraged to walk as tolerated. Do not exert yourself. Increase your activity level slowly. Remember that it is important to keep moving after an operation of any type. This cuts down on the possibility of developing blood clots.  Your caregiver will tell you when you can resume driving and  light housework. Discuss this at your first office visit after discharge. Diet No special diet is ordered after a TURP. However, if you are on a special diet for another medical problem, it should be continued.  Normal fluid intake is usually recommended.  Avoid alcohol and caffeinated drinks for 2 weeks. They irritate the bladder. Decaffeinated drinks are okay.  Avoid spicy foods.  Bladder Function For the first 3 weeks, empty the bladder whenever you feel a definite desire. Do not try to hold the urine for long periods of time. Urgency with urination can be normal after this surgery. Urinating once or twice a night even after you are healed is not uncommon.  You may see some recurrence of blood in the urine after discharge from the hospital. This usually happens within 2 weeks after the procedure.If this occurs, force fluids again as you did in the hospital and reduce your activity.  Bowel Function You may experience some constipation after surgery. This can be minimized by increasing fluids and fiber in your diet. Drink enough water and fluids to keep your urine clear or pale yellow.  A stool softener may be prescribed for use at home. Do not strain to move your bowels.  If you are requiring increased pain medicine, it is important that you take stool softeners to prevent constipation. This will help to promote proper healing by reducing the need to strain to move your bowels.  Sexual Activity Semen movement in the opposite direction and into the bladder (retrograde ejaculation) may occur. Since the semen passes  into the bladder, cloudy urine can occur the first time you urinate after intercourse. Or, you may not have an ejaculation during erection. Ask your caregiver when you can resume sexual activity. Retrograde ejaculation and reduced semen discharge should not reduce one's pleasure of intercourse.  Postoperative Visit Arrange the date and time of your after surgery visit with your caregiver.   YOU WILL BE CALLED TO SET UP A DATE TO REMOVE THE CATHETER NEXT WEEK Return to Work After your recovery is complete, you will be able to return to work and resume all activities. Your caregiver will inform you when you can return to work.    Foley Catheter Care A soft, flexible tube (Foley catheter) may have been placed in your bladder to drain urine and fluid. Follow these instructions: Taking Care of the Catheter Keep the area where the catheter leaves your body clean.  Attach the catheter to the leg so there is no tension on the catheter.  Keep the drainage bag below the level of the bladder, but keep it OFF the floor.  Do not take long soaking baths. Your caregiver will give instructions about showering.  Wash your hands before touching ANYTHING related to the catheter or bag.  Using mild soap and warm water on a washcloth:  Clean the area closest to the catheter insertion site using a circular motion around the catheter.  Clean the catheter itself by wiping AWAY from the insertion site for several inches down the tube.  NEVER wipe upward as this could sweep bacteria up into the urethra (tube in your body that normally drains the bladder) and cause infection.  Place a small amount of sterile lubricant at the tip of the penis where the catheter is entering.  Taking Care of the Drainage Bags Two drainage bags may be taken home: a large overnight drainage bag, and a smaller leg bag which fits underneath clothing.  It is okay to wear the overnight bag at any time, but NEVER wear the smaller leg bag at night.  Keep the drainage bag well below the level of your bladder. This prevents backflow of urine into the bladder and allows the urine to drain freely.  Anchor the tubing to your leg to prevent pulling or tension on the catheter. Use tape or a leg strap provided by the hospital.  Empty the drainage bag when it is 1/2 to 3/4 full. Wash your hands before and after touching the bag.   Periodically check the tubing for kinks to make sure there is no pressure on the tubing which could restrict the flow of urine.  Changing the Drainage Bags Cleanse both ends of the clean bag with alcohol before changing.  Pinch off the rubber catheter to avoid urine spillage during the disconnection.  Disconnect the dirty bag and connect the clean one.  Empty the dirty bag carefully to avoid a urine spill.  Attach the new bag to the leg with tape or a leg strap.  Cleaning the Drainage Bags Whenever a drainage bag is disconnected, it must be cleaned quickly so it is ready for the next use.  Wash the bag in warm, soapy water.  Rinse the bag thoroughly with warm water.  Soak the bag for 30 minutes in a solution of white vinegar and water (1 cup vinegar to 1 quart warm water).  Rinse with warm water.  SEEK MEDICAL CARE IF:  You have chills or night sweats.  You are leaking around your catheter or have problems  with your catheter. It is not uncommon to have sporadic leakage around your catheter as a result of bladder spasms. If the leakage stops, there is not much need for concern. If you are uncertain, call your caregiver.  You develop side effects that you think are coming from your medicines.  SEEK IMMEDIATE MEDICAL CARE IF:  You are suddenly unable to urinate. Check to see if there are any kinks in the drainage tubing that may cause this. If you cannot find any kinks, call your caregiver immediately. This is an emergency.  You develop shortness of breath or chest pains.  Bleeding persists or clots develop in your urine.  You have a fever.  You develop pain in your back or over your lower belly (abdomen).  You develop pain or swelling in your legs.  Any problems you are having get worse rather than better.  MAKE SURE YOU:  Understand these instructions.  Will watch your condition.  Will get help right away if you are not doing well or get worse.    Foley Catheter Care A soft, flexible  tube (Foley catheter) may have been placed in your bladder to drain urine and fluid. Follow these instructions: Taking Care of the Catheter Keep the area where the catheter leaves your body clean.  Attach the catheter to the leg so there is no tension on the catheter.  Keep the drainage bag below the level of the bladder, but keep it OFF the floor.  Do not take long soaking baths. Your caregiver will give instructions about showering.  Wash your hands before touching ANYTHING related to the catheter or bag.  Using mild soap and warm water on a washcloth:  Clean the area closest to the catheter insertion site using a circular motion around the catheter.  Clean the catheter itself by wiping AWAY from the insertion site for several inches down the tube.  NEVER wipe upward as this could sweep bacteria up into the urethra (tube in your body that normally drains the bladder) and cause infection.  Place a small amount of sterile lubricant at the tip of the penis where the catheter is entering.  Taking Care of the Drainage Bags Two drainage bags may be taken home: a large overnight drainage bag, and a smaller leg bag which fits underneath clothing.  It is okay to wear the overnight bag at any time, but NEVER wear the smaller leg bag at night.  Keep the drainage bag well below the level of your bladder. This prevents backflow of urine into the bladder and allows the urine to drain freely.  Anchor the tubing to your leg to prevent pulling or tension on the catheter. Use tape or a leg strap provided by the hospital.  Empty the drainage bag when it is 1/2 to 3/4 full. Wash your hands before and after touching the bag.  Periodically check the tubing for kinks to make sure there is no pressure on the tubing which could restrict the flow of urine.  Changing the Drainage Bags Cleanse both ends of the clean bag with alcohol before changing.  Pinch off the rubber catheter to avoid urine spillage during the  disconnection.  Disconnect the dirty bag and connect the clean one.  Empty the dirty bag carefully to avoid a urine spill.  Attach the new bag to the leg with tape or a leg strap.  Cleaning the Drainage Bags Whenever a drainage bag is disconnected, it must be cleaned quickly so it is ready  for the next use.  Wash the bag in warm, soapy water.  Rinse the bag thoroughly with warm water.  Soak the bag for 30 minutes in a solution of white vinegar and water (1 cup vinegar to 1 quart warm water).  Rinse with warm water.   IT Normal To See Some blood in the urine is normal, as long as you can see your fingers through the catheter tubing (not the bag) this is ok. As long as the urine is not the consistency of tomato paste.  It will be normal to feel the urge to urinate often this is a side effect of the catheter  Some discharge around the catheter where it exits the penis is normal  SEEK MEDICAL CARE IF:  You have chills or night sweats.  You are leaking around your catheter or have problems with your catheter. It is not uncommon to have sporadic leakage around your catheter as a result of bladder spasms. If the leakage stops, there is not much need for concern. If you are uncertain, call your caregiver.  You develop side effects that you think are coming from your medicines.  SEEK IMMEDIATE MEDICAL CARE IF:  You are suddenly unable to urinate. Check to see if there are any kinks in the drainage tubing that may cause this. If you cannot find any kinks, call your caregiver immediately. This is an emergency.  You develop shortness of breath or chest pains.  Bleeding persists or clots develop in your urine.  You have a fever.  You develop pain in your back or over your lower belly (abdomen).  You develop pain or swelling in your legs.  Any problems you are having get worse rather than better.  MAKE SURE YOU:  Understand these instructions.  Will watch your condition.  Will get help right away if  you are not doing well or get worse.

## 2023-06-06 NOTE — Anesthesia Preprocedure Evaluation (Addendum)
 Anesthesia Evaluation  Patient identified by MRN, date of birth, ID band Patient awake    Reviewed: Allergy & Precautions, NPO status , Patient's Chart, lab work & pertinent test results  Airway Mallampati: II  TM Distance: >3 FB Neck ROM: Full    Dental no notable dental hx. (+) Partial Lower, Partial Upper   Pulmonary asthma    Pulmonary exam normal        Cardiovascular hypertension, Pt. on medications and Pt. on home beta blockers + CAD, + Past MI and + Cardiac Stents   Rhythm:Regular Rate:Normal  Echo 11/24/2019 1. Left ventricular ejection fraction, by estimation, is 55 to 60%. The  left ventricle has normal function. The left ventricle demonstrates  regional wall motion abnormalities (see scoring diagram/findings for  description). Left ventricular diastolic  parameters are consistent with Grade I diastolic dysfunction (impaired  relaxation). There is akinesis of the left ventricular, apical septal wall  and inferior wall. There is akinesis of the left ventricular, apical  segment.   2. Right ventricular systolic function is normal. The right ventricular  size is normal.   3. The mitral valve is normal in structure. No evidence of mitral valve  regurgitation. No evidence of mitral stenosis.   4. The aortic valve is normal in structure. Aortic valve regurgitation is  trivial. No aortic stenosis is present.   5. The inferior vena cava is normal in size with greater than 50%  respiratory variability, suggesting right atrial pressure of 3 mmHg.     Neuro/Psych negative neurological ROS  negative psych ROS   GI/Hepatic Neg liver ROS, PUD,GERD  Medicated,,  Endo/Other  diabetes, Type 2, Oral Hypoglycemic Agents    Renal/GU negative Renal ROS  negative genitourinary   Musculoskeletal  (+) Arthritis ,  Fibromyalgia -  Abdominal Normal abdominal exam  (+)   Peds  Hematology  (+) Blood dyscrasia, anemia    Anesthesia Other Findings   Reproductive/Obstetrics                             Anesthesia Physical Anesthesia Plan  ASA: 3  Anesthesia Plan: General   Post-op Pain Management:    Induction: Intravenous  PONV Risk Score and Plan: 2 and Ondansetron, Dexamethasone, Midazolam and Treatment may vary due to age or medical condition  Airway Management Planned: Mask and Oral ETT  Additional Equipment: None  Intra-op Plan:   Post-operative Plan: Extubation in OR  Informed Consent: I have reviewed the patients History and Physical, chart, labs and discussed the procedure including the risks, benefits and alternatives for the proposed anesthesia with the patient or authorized representative who has indicated his/her understanding and acceptance.     Dental advisory given  Plan Discussed with: CRNA  Anesthesia Plan Comments:        Anesthesia Quick Evaluation

## 2023-06-07 ENCOUNTER — Encounter (HOSPITAL_COMMUNITY): Payer: Self-pay | Admitting: Urology

## 2023-06-10 LAB — SURGICAL PATHOLOGY

## 2023-06-10 LAB — CYTOLOGY - NON PAP

## 2023-06-11 NOTE — Anesthesia Postprocedure Evaluation (Signed)
 Anesthesia Post Note  Patient: [name]  Procedure(s) Performed: Removal of bladder stone ,Right ureteroscopy diagnostic cytology obtained., Bladder biopsy near left ureteral orifice, Transurethral resection of prostate removal of median lobe.,  Prostate biopsy, Urethral dilation BIOPSY, PROSTATE, RECTAL APPROACH, WITH US GUIDANCE     Patient location during evaluation: PACU Anesthesia Type: General Level of consciousness: awake and alert Pain management: pain level controlled Vital Signs Assessment: post-procedure vital signs reviewed and stable Respiratory status: spontaneous breathing, nonlabored ventilation, respiratory function stable and patient connected to nasal cannula oxygen Cardiovascular status: blood pressure returned to baseline and stable Postop Assessment: no apparent nausea or vomiting Anesthetic complications: yes   Encounter Notable Events  Notable Event Outcome Phase Comment  Difficult to intubate - expected  Intraprocedure Filed from anesthesia note documentation.    Last Vitals:  Vitals:   06/06/23 1845 06/06/23 1900  BP: (!) 141/69 (!) 140/92  Pulse: 86 86  Resp: 14 11  Temp:    SpO2: 98% 96%    Last Pain:  Vitals:   06/06/23 1900  TempSrc:   PainSc: 4                  Milam Allbaugh P Gunda Maqueda

## 2023-06-12 DIAGNOSIS — N400 Enlarged prostate without lower urinary tract symptoms: Secondary | ICD-10-CM | POA: Diagnosis not present

## 2023-06-13 ENCOUNTER — Ambulatory Visit: Payer: BC Managed Care – PPO | Admitting: Orthopedic Surgery

## 2023-06-13 ENCOUNTER — Other Ambulatory Visit: Payer: Self-pay | Admitting: Internal Medicine

## 2023-06-13 DIAGNOSIS — K5180 Other ulcerative colitis without complications: Secondary | ICD-10-CM

## 2023-06-14 ENCOUNTER — Telehealth: Payer: Self-pay | Admitting: Physician Assistant

## 2023-06-14 ENCOUNTER — Ambulatory Visit

## 2023-06-14 VITALS — BP 123/73 | HR 105 | Temp 99.0°F | Resp 18 | Ht 68.0 in | Wt 167.8 lb

## 2023-06-14 DIAGNOSIS — K5190 Ulcerative colitis, unspecified, without complications: Secondary | ICD-10-CM | POA: Diagnosis not present

## 2023-06-14 MED ORDER — VEDOLIZUMAB 300 MG IV SOLR
300.0000 mg | Freq: Once | INTRAVENOUS | Status: AC
  Administered 2023-06-14: 300 mg via INTRAVENOUS
  Filled 2023-06-14: qty 5

## 2023-06-14 NOTE — Telephone Encounter (Signed)
 Inbound call from patient stating sulfasalazine has not  been sent to his pharmacy as of yet that as order yesterday 3/27. Patient is requesting a call. Please advise, thank you.

## 2023-06-14 NOTE — Progress Notes (Signed)
 Diagnosis: Ulcerative Colitis  Provider:  Chilton Greathouse MD  Procedure: IV Infusion  IV Type: Peripheral, IV Location: R Forearm  Entyvio (Vedolizumab), Dose: 300 mg  Infusion Start Time: 1422  Infusion Stop Time: 1458  Post Infusion IV Care: Peripheral IV Discontinued  Discharge: Condition: Good, Destination: Home . AVS Provided  Performed by:  Garnette Czech, RN

## 2023-06-14 NOTE — Telephone Encounter (Signed)
 Called and spoke with pharmacy at [facility]. They do have rx for Sulfasalazine and it has been filled. Ready for pick-up. I called and left message for patient informing him of this information.

## 2023-06-17 DIAGNOSIS — R310 Gross hematuria: Secondary | ICD-10-CM | POA: Diagnosis not present

## 2023-06-17 DIAGNOSIS — C61 Malignant neoplasm of prostate: Secondary | ICD-10-CM | POA: Diagnosis not present

## 2023-06-18 ENCOUNTER — Ambulatory Visit: Payer: BC Managed Care – PPO | Admitting: Cardiology

## 2023-06-19 DIAGNOSIS — N390 Urinary tract infection, site not specified: Secondary | ICD-10-CM | POA: Diagnosis not present

## 2023-06-19 DIAGNOSIS — N452 Orchitis: Secondary | ICD-10-CM | POA: Diagnosis not present

## 2023-06-19 DIAGNOSIS — B965 Pseudomonas (aeruginosa) (mallei) (pseudomallei) as the cause of diseases classified elsewhere: Secondary | ICD-10-CM | POA: Diagnosis not present

## 2023-06-27 ENCOUNTER — Other Ambulatory Visit: Payer: Self-pay | Admitting: Orthopedic Surgery

## 2023-06-27 DIAGNOSIS — J4530 Mild persistent asthma, uncomplicated: Secondary | ICD-10-CM

## 2023-07-08 DIAGNOSIS — C61 Malignant neoplasm of prostate: Secondary | ICD-10-CM | POA: Diagnosis not present

## 2023-07-08 DIAGNOSIS — R310 Gross hematuria: Secondary | ICD-10-CM | POA: Diagnosis not present

## 2023-07-09 ENCOUNTER — Other Ambulatory Visit: Payer: Self-pay | Admitting: Urology

## 2023-07-09 DIAGNOSIS — C61 Malignant neoplasm of prostate: Secondary | ICD-10-CM

## 2023-07-10 ENCOUNTER — Encounter: Payer: Self-pay | Admitting: Urology

## 2023-07-16 ENCOUNTER — Telehealth: Payer: Self-pay | Admitting: Cardiology

## 2023-07-16 NOTE — Telephone Encounter (Signed)
 Called patient back about message. Patient stated that Dr. Cathi Cluster told him to stop his plavix . Patient stated he will most likely need to be off it until his surgery. Patient stated his surgery will not be until after June 1st. Patient stated he is just peeing blood. Patient state if you have questions to call Dr. Cathi Cluster, that he cannot give details on why. Will send message to Dr. Filiberto Hug and his nurse.

## 2023-07-16 NOTE — Telephone Encounter (Signed)
 Pt c/o medication issue:  1. Name of Medication:   clopidogrel  (PLAVIX ) 75 MG tablet   2. How are you currently taking this medication (dosage and times per day)?   No taking  3. Are you having a reaction (difficulty breathing--STAT)?   4. What is your medication issue?   Patient called to report he has had blood in his urine and his urologist has stopped him taking this medication.

## 2023-07-17 NOTE — Telephone Encounter (Signed)
 Agree with Plavix  discontinuation. Consider Aspirin  81 mg at least couple time a week, only if okay with Dr. Cathi Cluster.  Thanks MJP

## 2023-07-18 ENCOUNTER — Other Ambulatory Visit (HOSPITAL_COMMUNITY): Payer: Self-pay | Admitting: Urology

## 2023-07-18 DIAGNOSIS — C61 Malignant neoplasm of prostate: Secondary | ICD-10-CM

## 2023-07-18 NOTE — Telephone Encounter (Signed)
 Called patient to let him know he can take Aspirin  81 mg by mouth several times a week, per Dr. Filiberto Hug and Dr. Edson Graces. Patient agreed to plan.

## 2023-07-22 ENCOUNTER — Telehealth: Payer: Self-pay | Admitting: Pharmacy Technician

## 2023-07-22 ENCOUNTER — Other Ambulatory Visit (HOSPITAL_COMMUNITY): Payer: Self-pay

## 2023-07-22 ENCOUNTER — Encounter: Payer: Self-pay | Admitting: Pharmacy Technician

## 2023-07-22 NOTE — Telephone Encounter (Signed)
 Pharmacy Patient Advocate Encounter  Received notification from [facility] that Prior Authorization for icosapent  1gm has been APPROVED from 06/22/23 to 07/21/24. Spoke to pharmacy to process.Copay is $10.00.

## 2023-07-22 NOTE — Telephone Encounter (Signed)
 Pharmacy Patient Advocate Encounter   Received notification from CoverMyMeds that prior authorization for icosapent  Ethyl 1GM  is required/requested.   Insurance verification completed.   The patient is insured through [employer] .   Per test claim: PA required; PA submitted to above mentioned insurance via CoverMyMeds Key/confirmation #/EOC [id number] Status is pending

## 2023-07-24 ENCOUNTER — Telehealth (INDEPENDENT_AMBULATORY_CARE_PROVIDER_SITE_OTHER): Payer: Self-pay | Admitting: Otolaryngology

## 2023-07-24 ENCOUNTER — Ambulatory Visit (INDEPENDENT_AMBULATORY_CARE_PROVIDER_SITE_OTHER): Payer: BC Managed Care – PPO

## 2023-07-24 ENCOUNTER — Encounter (INDEPENDENT_AMBULATORY_CARE_PROVIDER_SITE_OTHER): Payer: Self-pay

## 2023-07-24 VITALS — BP 112/75 | HR 102

## 2023-07-24 DIAGNOSIS — H903 Sensorineural hearing loss, bilateral: Secondary | ICD-10-CM | POA: Diagnosis not present

## 2023-07-24 DIAGNOSIS — H9313 Tinnitus, bilateral: Secondary | ICD-10-CM | POA: Diagnosis not present

## 2023-07-24 NOTE — Telephone Encounter (Signed)
 Left vm to confirm appt date and location for 07/24/2023 with Dr.Teoh

## 2023-07-24 NOTE — Progress Notes (Unsigned)
 Patient ID: [name], male   DOB: 1960/09/05, 63 y.o.   MRN: [id number]  Follow-up: Bilateral hearing loss, tinnitus  HPI: The patient is a 63 year old male who returns today for his follow-up evaluation.  The patient was previously seen for bilateral sensorineural hearing loss and bilateral tinnitus.  At his last visit 1 year ago, he was noted to have bilateral high-frequency sensorineural hearing loss, worse on the left side.  His tinnitus was likely a result of his hearing loss.  The small possibility of a retrocochlear lesion causing his asymmetric hearing loss was discussed.  The patient elected to proceed with conservative observation.  The patient returns today reporting no significant change in his hearing loss or tinnitus.  He currently wears bilateral hearing aids.  He reports improvement in his hearing with the hearing aids.  Currently he denies any otalgia or otorrhea.  Exam: General: Communicates without difficulty, well nourished, no acute distress. Head: Normocephalic, no evidence injury, no tenderness, facial buttresses intact without stepoff. Face/sinus: No tenderness to palpation and percussion. Facial movement is normal and symmetric. Eyes: PERRL, EOMI. No scleral icterus, conjunctivae clear. Neuro: CN II exam reveals vision grossly intact.  No nystagmus at any point of gaze. Ears: Auricles well formed without lesions.  Ear canals are intact without mass or lesion.  No erythema or edema is appreciated.  The TMs are intact without fluid. Nose: External evaluation reveals normal support and skin without lesions.  Dorsum is intact.  Anterior rhinoscopy reveals pink mucosa over anterior aspect of inferior turbinates and intact septum.  No purulence noted. Oral:  Oral cavity and oropharynx are intact, symmetric, without erythema or edema.  Mucosa is moist without lesions. Neck: Full range of motion without pain.  There is no significant lymphadenopathy.  No masses palpable.  Thyroid bed  within normal limits to palpation.  Parotid glands and submandibular glands equal bilaterally without mass.  Trachea is midline. Neuro:  CN 2-12 grossly intact. Gait normal.   Assessment:  1.  Subjectively stable bilateral high-frequency sensorineural hearing loss, worse on the left side. 2.  The patient's ear canals, tympanic membranes, and middle ear spaces are all normal.  3.  His tinnitus is likely a direct result of his hearing loss.   Exam:  1.  The physical exam findings are reviewed with the patient.  2.  Continue the use of his hearing aids. 3.  The strategies to cope with tinnitus are discussed.   4.  The patient will return for re-evaluation in 12 months.  A repeat hearing test will be performed at that time.  No audiologist is available today.

## 2023-07-25 DIAGNOSIS — H903 Sensorineural hearing loss, bilateral: Secondary | ICD-10-CM | POA: Insufficient documentation

## 2023-07-25 DIAGNOSIS — H9313 Tinnitus, bilateral: Secondary | ICD-10-CM | POA: Insufficient documentation

## 2023-08-09 ENCOUNTER — Ambulatory Visit (INDEPENDENT_AMBULATORY_CARE_PROVIDER_SITE_OTHER)

## 2023-08-09 VITALS — BP 99/63 | HR 87 | Temp 98.3°F | Resp 16

## 2023-08-09 DIAGNOSIS — K5190 Ulcerative colitis, unspecified, without complications: Secondary | ICD-10-CM | POA: Diagnosis not present

## 2023-08-09 MED ORDER — VEDOLIZUMAB 300 MG IV SOLR
300.0000 mg | Freq: Once | INTRAVENOUS | Status: AC
  Administered 2023-08-09: 300 mg via INTRAVENOUS
  Filled 2023-08-09: qty 5

## 2023-08-09 NOTE — Progress Notes (Signed)
 Diagnosis: Ulcerative Colitis  Provider:  Mannam, Praveen MD  Procedure: IV Infusion  IV Type: Peripheral, IV Location: L Antecubital  Entyvio  (Vedolizumab ), Dose: 300 mg  Infusion Start Time: 1418  Infusion Stop Time: 1454  Post Infusion IV Care: Peripheral IV Discontinued  Discharge: Condition: Good, Destination: Home . AVS Declined  Performed by:  Lendel Quant, RN

## 2023-08-16 ENCOUNTER — Telehealth: Payer: Self-pay | Admitting: *Deleted

## 2023-08-16 ENCOUNTER — Telehealth: Payer: Self-pay | Admitting: Cardiology

## 2023-08-16 ENCOUNTER — Other Ambulatory Visit: Payer: Self-pay | Admitting: Urology

## 2023-08-16 NOTE — Telephone Encounter (Signed)
   Name: [name]  DOB: 12/10/1960  MRN: [id number]  Primary Cardiologist: Avery Bodo, MD   Preoperative team, please contact this patient and set up a phone call appointment for further preoperative risk assessment. Please obtain consent and complete medication review. Thank you for your help.  I confirm that guidance regarding antiplatelet and oral anticoagulation therapy has been completed and, if necessary, noted below.  Reviewed notes in Epic, patient appears to have held Plavix  recently in the setting of hematuria. He was advised  to only take aspirin  a couple of times a week. Will need to clarify current antiplatelet therapy.  I also confirmed the patient resides in the state of [place] . As per [facility] Medical Board telemedicine laws, the patient must reside in the state in which the provider is licensed.   Jude Norton, NP 08/16/2023, 4:14 PM [facility]

## 2023-08-16 NOTE — Telephone Encounter (Signed)
 S/w the pt and has been scheduled tele preop appt 09/04/23. Med rec and consent are done. Pt tells me in regard to ASA and Plavix  due the bleeding issues he is having when he goes to the bathroom the urologist suggest to hold for a bot then resume 2-3 times a week.     Patient Consent for Virtual Visit        [name] has provided verbal consent on 08/16/2023 for a virtual visit (video or telephone).   CONSENT FOR VIRTUAL VISIT FOR:  [name]  By participating in this virtual visit I agree to the following:  I hereby voluntarily request, consent and authorize [facility] and its employed or contracted physicians, physician assistants, nurse practitioners or other licensed health care professionals (the Practitioner), to provide me with telemedicine health care services (the "Services") as deemed necessary by the treating Practitioner. I acknowledge and consent to receive the Services by the Practitioner via telemedicine. I understand that the telemedicine visit will involve communicating with the Practitioner through live audiovisual communication technology and the disclosure of certain medical information by electronic transmission. I acknowledge that I have been given the opportunity to request an in-person assessment or other available alternative prior to the telemedicine visit and am voluntarily participating in the telemedicine visit.  I understand that I have the right to withhold or withdraw my consent to the use of telemedicine in the course of my care at any time, without affecting my right to future care or treatment, and that the Practitioner or I may terminate the telemedicine visit at any time. I understand that I have the right to inspect all information obtained and/or recorded in the course of the telemedicine visit and may receive copies of available information for a reasonable fee.  I understand that some of the potential risks of receiving the Services  via telemedicine include:  Delay or interruption in medical evaluation due to technological equipment failure or disruption; Information transmitted may not be sufficient (e.g. poor resolution of images) to allow for appropriate medical decision making by the Practitioner; and/or  In rare instances, security protocols could fail, causing a breach of personal health information.  Furthermore, I acknowledge that it is my responsibility to provide information about my medical history, conditions and care that is complete and accurate to the best of my ability. I acknowledge that Practitioner's advice, recommendations, and/or decision may be based on factors not within their control, such as incomplete or inaccurate data provided by me or distortions of diagnostic images or specimens that may result from electronic transmissions. I understand that the practice of medicine is not an exact science and that Practitioner makes no warranties or guarantees regarding treatment outcomes. I acknowledge that a copy of this consent can be made available to me via my patient portal [facility] MyChart), or I can request a printed copy by calling the office of [facility].    I understand that my insurance will be billed for this visit.   I have read or had this consent read to me. I understand the contents of this consent, which adequately explains the benefits and risks of the Services being provided via telemedicine.  I have been provided ample opportunity to ask questions regarding this consent and the Services and have had my questions answered to my satisfaction. I give my informed consent for the services to be provided through the use of telemedicine in my medical care

## 2023-08-16 NOTE — Telephone Encounter (Signed)
 CORRECT SPELLING OF SURGEON'S NAME IS DR. Cathi Cluster.    S/w the pt and has been scheduled tele preop appt 09/04/23. Med rec and consent are done. Pt tells me in regard to ASA and Plavix  due the bleeding issues he is having when he goes to the bathroom the urologist suggest to hold for a bot then resume 2-3 times a week.

## 2023-08-16 NOTE — Telephone Encounter (Signed)
 S/w the pt and has been scheduled tele preop appt 09/04/23. Med rec and consent are done. Pt tells me in regard to ASA and Plavix  due the bleeding issues he is having when he goes to the bathroom the urologist suggest to hold for a bot then resume 2-3 times a week.

## 2023-08-16 NOTE — Telephone Encounter (Signed)
   Pre-operative Risk Assessment    Patient Name: [name]  DOB: Oct 23, 1960 MRN: [id number]   Date of last office visit: 06/01/23 Date of next office visit: n/a   Request for Surgical Clearance    Procedure:  Radical robic prostatectomy with bilaterly limph not disception   Date of Surgery:  Clearance 09/26/23                                Surgeon:  Dr. Caryn Clause Group or Practice Name:  [facility] Phone number:  [phone] Fax number:  [phone]   Type of Clearance Requested:   - Medical  - Pharmacy:  Hold Clopidogrel  (Plavix ) 5 days   Type of Anesthesia:  General    Additional requests/questions:  Does this patient need antibiotics?    Signed, Caryn Clause   08/16/2023, 4:06 PM

## 2023-08-18 ENCOUNTER — Other Ambulatory Visit

## 2023-08-19 DIAGNOSIS — M6281 Muscle weakness (generalized): Secondary | ICD-10-CM | POA: Diagnosis not present

## 2023-08-19 DIAGNOSIS — C61 Malignant neoplasm of prostate: Secondary | ICD-10-CM | POA: Diagnosis not present

## 2023-08-21 ENCOUNTER — Encounter (HOSPITAL_COMMUNITY): Payer: Self-pay | Admitting: Vascular Surgery

## 2023-08-21 NOTE — Progress Notes (Signed)
 Anesthesia Chart Review: [name]  Case: [id number] Date/Time: 08/22/23 1015   Procedure: MRI WITH ANESTHESIA - MRI OF PROSTATE WITH AND WITHOUT CONTRAST   Anesthesia type: General   Diagnosis: Prostate cancer (HCC) [C61]   Pre-op diagnosis: PROSTATE CANCER   [facility] / [facility]   Surgeons: Radiologist, Medication, MD       DISCUSSION: Patient is a 63 year old male scheduled for MRI of the prostate under anesthesia.  Ordering provider is Dr. Aimee Carter. MRI scheduler is in communication with [facility] regarding H&P. Currently, last visit with exam available is by ENT Dr. Alica Carter on 07/24/23. Of note, Mr. [name] s also currently scheduled for prostatectomy on 09/26/23 at [facility].   History includes never smoker, DM2, HLD, CAD (BMS mid LAD for unstable angina 01/08/07; anterior STEMI s/p DES mid LAD that overlapped old BMS 11/23/19), asthma, ankylosing spondylitis, ulcerative colitis, GERD, hypercalcemia, fibromyalgia, TMJ, skin cancer (left eyelid sebaceous carcinoma, s/p excision with eyelid reconstructions 07/27/15)., prostate cancer (TURP with biopsies 06/06/23 + adenocarcinoma).  He was  last seen by cardiologist Dr. Filiberto Hug on  05/31/2023. Per OV note, "No anginal symptoms with good functional capacity. I do not think he needs any further cardiac testing before his scheduled prostate biopsy next week. 3 years out of his MI and PCI, he does not need dual antiplatelet therapy. Continue to hold Aspirin  and Plavix .  After biopsy, -If malignant diagnosis and need for repeat surgeries/procedures, recommend resuming Aspirin  81 mg, hold 3-5 days before repeat procedure -If no malignancy found, recommend resuming Plavix  75 mg daily. -Regardless of malignancy or not, he would not need to be on dual antiplatelet therapy in the future." Per 07/17/23 telephone encounter by Dr. Filiberto Hug, "Agree with Plavix  discontinuation [due to hematuria]. Consider Aspirin  81 mg at least  couple time a week, only if okay with Dr. Cathi Carter."   He had recent ENT evaluation by Harold Caves, MD on 07/24/23 for tinnitus, felt likely a result of his known sensorineural hearing loss.   As of 06/06/23 Cr 0.75, glucose 128, A1c 5.6%. H/H 14.2/42.7, PLT 289K. Updated labs on arrival as indicated.    VS:  Wt Readings from Last 3 Encounters:  06/14/23 76.1 kg  06/06/23 76 kg  05/31/23 76.7 kg   BP Readings from Last 3 Encounters:  08/09/23 99/63  07/24/23 112/75  06/14/23 123/73   Pulse Readings from Last 3 Encounters:  08/09/23 87  07/24/23 (!) 102  06/14/23 (!) 105     PROVIDERS: Harold Bhat, NP is PCP  Harold Ivy, MD is cardiologist Harold Alf, MD is urologist  Harold Perfect, MD is rheumatologist   LABS: See DISCUSSION.   EKG: EKG 05/31/2023: Sinus tachycardia with 1st degree A-V block with Premature atrial complexes Left anterior fascicular block When compared with ECG of 07-Aug-2020 10:39, Premature atrial complexes are now Present Left anterior fascicular block is now Present Incomplete right bundle branch block is no longer Present Confirmed by [name] [phone]) on 05/31/2023 2:36:58 PM   CV: Echo 11/24/2019: 1. Left ventricular ejection fraction, by estimation, is 55 to 60%. The  left ventricle has normal function. The left ventricle demonstrates  regional wall motion abnormalities (see scoring diagram/findings for  description). Left ventricular diastolic  parameters are consistent with Grade I diastolic dysfunction (impaired  relaxation). There is akinesis of the left ventricular, apical septal wall  and inferior wall. There is akinesis of the left ventricular, apical  segment.   2. Right ventricular  systolic function is normal. The right ventricular  size is normal.   3. The mitral valve is normal in structure. No evidence of mitral valve  regurgitation. No evidence of mitral stenosis.   4. The aortic valve is normal in  structure. Aortic valve regurgitation is  trivial. No aortic stenosis is present.   5. The inferior vena cava is normal in size with greater than 50%  respiratory variability, suggesting right atrial pressure of 3 mmHg.    LHC 11/23/2019: 2nd Sept lesion is 90% stenosed.   1.  Critical stenosis of the mid LAD and patient with anterior STEMI, treated successfully with PCI of the mid LAD using a drug-eluting stent (2.5 x 18 mm resolute Onyx) overlapped with the old bare-metal stent which remained patent. 2.  Severe first diagonal stenosis treated successfully with a 3.0 x 15 mm resolute Onyx DES 3.  Diffuse distal vessel coronary artery disease involving the LAD and PDA 4.  Mild nonobstructive left mainstem and left circumflex stenosis   Recommendations: 2D echo for LVEF assessment, aggressive medical therapy for secondary risk reduction, initially favor medical therapy for severe PDA stenosis because the vessel is diffusely diseased with multiple lesions throughout.  If refractory angina would be reasonable to treat the proximal PDA lesion with stenting.  Will review with interventional colleagues.     24-48 Hour Holter monitor 07/08/2017: Normal sinus rhythm with occasional PACs and PVCs. Short runs of SVT, less than 5 beats typically. No sustained arrhtyhmias.   Continue medical therapy.  If symptoms worsen, could increase beta blocker.   Past Medical History:  Diagnosis Date   Anemia    Ankylosing spondylitis (HCC)    Arthritis    Asthma    SINCE AGE 26   Colitis, ulcerative (HCC)    Coronary artery disease    Bare metal LAD stent 10/08   Coronary atherosclerosis of native coronary artery 12/25/2012   Diabetes mellitus without complication [facility])    ED (erectile dysfunction)    Elevated prostate specific antigen (PSA) 12/25/2012   Esophageal reflux 12/25/2012   Extrinsic asthma, unspecified 12/25/2012   Fibromyalgia    GERD (gastroesophageal reflux disease)    History of heart  attack    Hypercalcemia 12/25/2012   Hyperlipidemia    Left sided ulcerative (chronic) colitis (HCC) 12/25/2012   Mixed hyperlipidemia 12/25/2012   Myocardial infarction [facility])    Pneumonia    as a child   Prostate cancer (HCC) 06/06/2023   Pure hypercholesterolemia 12/25/2012   Skin cancer of eyelid    TMJ (dislocation of temporomandibular joint)     Past Surgical History:  Procedure Laterality Date   COLONOSCOPY     CORONARY/GRAFT ACUTE MI REVASCULARIZATION N/A 11/23/2019   Procedure: Coronary/Graft Acute MI Revascularization;  Surgeon: Arnoldo Lapping, MD;  Location: [facility] INVASIVE CV LAB;  Service: Cardiovascular;  Laterality: N/A;   EYE SURGERY Bilateral    EYE SURGERY     heart stent     LEFT HEART CATH AND CORONARY ANGIOGRAPHY N/A 11/23/2019   Procedure: LEFT HEART CATH AND CORONARY ANGIOGRAPHY;  Surgeon: Arnoldo Lapping, MD;  Location: [facility] INVASIVE CV LAB;  Service: Cardiovascular;  Laterality: N/A;   PROSTATE BIOPSY N/A 06/06/2023   Procedure: BIOPSY, PROSTATE, RECTAL APPROACH, WITH US  GUIDANCE;  Surgeon: Thelbert Finner, MD;  Location: [facility] ORS;  Service: Urology;  Laterality: N/A;   skin cancer removed from left eyelid      skull fracture after falling down stairs     TONSILLECTOMY  TRANSURETHRAL RESECTION OF BLADDER TUMOR N/A 06/06/2023   Procedure: Removal of bladder stone   ,Right ureteroscopy diagnostic cytology obtained. , Bladder biopsy near left ureteral orifice , Transurethral resection of prostate removal of median lobe. ,  Prostate biopsy, Urethral dilation;  Surgeon: Thelbert Finner, MD;  Location: [facility] ORS;  Service: Urology;  Laterality: N/A;  BILATERAL RETROGRADE PYELOGRAM, POSSIBLE GEMCITABINE INSTILLATION    MEDICATIONS: No current facility-administered medications for this encounter.    albuterol  (VENTOLIN  HFA) 108 (90 Base) MCG/ACT inhaler   aspirin  81 MG tablet   clopidogrel  (PLAVIX ) 75 MG tablet   cyclobenzaprine  (FLEXERIL ) 5 MG tablet    empagliflozin  (JARDIANCE ) 10 MG TABS tablet   Ferrous Gluconate (IRON 27 PO)   gabapentin  (NEURONTIN ) 100 MG capsule   icosapent  Ethyl (VASCEPA ) 1 g capsule   lisinopril  (ZESTRIL ) 2.5 MG tablet   loratadine  (CLARITIN ) 10 MG tablet   metFORMIN  (GLUCOPHAGE -XR) 500 MG 24 hr tablet   metoprolol  succinate (TOPROL -XL) 50 MG 24 hr tablet   Multiple Vitamins-Minerals (CENTRUM CARDIO) TABS   nitroGLYCERIN  (NITROSTAT ) 0.4 MG SL tablet   pantoprazole  (PROTONIX ) 40 MG tablet   REPATHA  SURECLICK 140 MG/ML SOAJ   sulfaSALAzine  (AZULFIDINE ) 500 MG EC tablet   theophylline  (THEODUR) 300 MG 12 hr tablet   vedolizumab  (ENTYVIO ) 300 MG injection   hyoscyamine  (ANASPAZ ) 0.125 MG TBDP disintergrating tablet   polyethylene glycol (MIRALAX ) 17 g packet   tamsulosin  (FLOMAX ) 0.4 MG CAPS capsule   Not currently taking Plavix , Miralx, Flomax , or hyoscyamine .    Ella Gun, PA-C Surgical Short Stay/Anesthesiology [facility] Phone [phone] [facility] Phone [phone] 08/21/2023 4:35 PM

## 2023-08-21 NOTE — Anesthesia Preprocedure Evaluation (Signed)
 Anesthesia Evaluation    Airway        Dental   Pulmonary           Cardiovascular      Neuro/Psych    GI/Hepatic   Endo/Other  diabetes    Renal/GU      Musculoskeletal   Abdominal   Peds  Hematology   Anesthesia Other Findings   Reproductive/Obstetrics                             Anesthesia Physical Anesthesia Plan  ASA:   Anesthesia Plan:    Post-op Pain Management:    Induction:   PONV Risk Score and Plan:   Airway Management Planned:   Additional Equipment:   Intra-op Plan:   Post-operative Plan:   Informed Consent:   Plan Discussed with:   Anesthesia Plan Comments: (PAT note written 08/21/2023 by Amyah Clawson, PA-C.  )       Anesthesia Quick Evaluation

## 2023-08-22 ENCOUNTER — Encounter (HOSPITAL_COMMUNITY): Admission: RE | Payer: Self-pay | Source: Home / Self Care

## 2023-08-22 ENCOUNTER — Encounter (HOSPITAL_COMMUNITY): Payer: Self-pay

## 2023-08-22 ENCOUNTER — Encounter (HOSPITAL_COMMUNITY): Payer: Self-pay | Admitting: Physician Assistant

## 2023-08-22 ENCOUNTER — Ambulatory Visit (HOSPITAL_COMMUNITY): Admission: RE | Admit: 2023-08-22 | Source: Home / Self Care

## 2023-08-22 ENCOUNTER — Ambulatory Visit (HOSPITAL_COMMUNITY)

## 2023-08-22 SURGERY — MRI WITH ANESTHESIA
Anesthesia: General

## 2023-08-23 ENCOUNTER — Other Ambulatory Visit: Payer: Self-pay

## 2023-08-23 ENCOUNTER — Encounter (HOSPITAL_COMMUNITY): Payer: Self-pay

## 2023-08-23 NOTE — Progress Notes (Signed)
 SDW call  Patient was given pre-op instructions over the phone. Patient verbalized understanding of instructions provided.     PCP - Ulyses Gandy, NP Cardiologist - Dr. Filiberto Hug Pulmonary:    PPM/ICD - denies Device Orders - na Rep Notified - na   Chest x-ray - na EKG -  05/31/2023 Stress Test - ECHO - 11/24/2019 Cardiac Cath - 11/23/2019  Sleep Study/sleep apnea/CPAP: denies  Type II diabetic.  Does not check his blood sugar.  Last A1C 5.6 06/06/2023 Fasting Blood sugar range How often check sugars Jardiance , hold 72 hours prior to surgery Metformin , hold DOS   Blood Thinner Instructions: Plavix , states last dose 08/14/2023 Aspirin  Instructions: states last dose 08/22/2023   ERAS Protcol - Clears until 0500   Anesthesia review: Yes.  Margretta Shi reviewed 08/21/2023.  DM, CAD, MI, Asthma, HLD   Patient denies shortness of breath, fever, cough and chest pain over the phone call  Your procedure is scheduled on Tuesday August 27, 2023  Report to [facility] Main Entrance "A" at  0530  A.M., then check in with the Admitting office.  Call this number if you have problems the morning of surgery:  [phone]   If you have any questions prior to your surgery date call [phone]: Open Monday-Friday 8am-4pm If you experience any cold or flu symptoms such as cough, fever, chills, shortness of breath, etc. between now and your scheduled surgery, please notify us  at the above number    Remember:  Do not eat after midnight the night before your surgery  You may drink clear liquids until  0500   the morning of your surgery.   Clear liquids allowed are: Water , Non-Citrus Juices (without pulp), Carbonated Beverages, Clear Tea, Black Coffee ONLY (NO MILK, CREAM OR POWDERED CREAMER of any kind), and Gatorade   Take these medicines the morning of surgery with A SIP OF WATER :  Claritin , metoprolol , protonix , sulfasalazine , theophylline   As needed: albuterol   As of today, STOP taking any Aleve,  Naproxen, Ibuprofen, Motrin, Advil, Goody's, BC's, all herbal medications, fish oil, and all vitamins.

## 2023-08-26 NOTE — Anesthesia Preprocedure Evaluation (Addendum)
 Anesthesia Evaluation  Patient identified by MRN, date of birth, ID band Patient awake    Reviewed: Allergy & Precautions, NPO status , Patient's Chart, lab work & pertinent test results, reviewed documented beta blocker date and time   Airway Mallampati: II  TM Distance: >3 FB Neck ROM: Limited    Dental  (+) Partial Lower, Partial Upper, Dental Advisory Given, Poor Dentition, Missing   Pulmonary asthma , pneumonia, resolved   Pulmonary exam normal breath sounds clear to auscultation       Cardiovascular hypertension, Pt. on medications and Pt. on home beta blockers + CAD, + Past MI and + Cardiac Stents  Normal cardiovascular exam Rhythm:Regular Rate:Normal  BMS mid LAD for unstable angina 01/08/07; anterior STEMI s/p DES mid LAD that overlapped old BMS 11/23/19  EKG 05/31/2023: Sinus tachycardia with 1st degree A-V block with Premature atrial complexes Left anterior fascicular block When compared with ECG of 07-Aug-2020 10:39, Premature atrial complexes are now Present Left anterior fascicular block is now Present Incomplete right bundle branch block is no longer Present  Echo 11/24/19  1. Left ventricular ejection fraction, by estimation, is 55 to 60%. The  left ventricle has normal function. The left ventricle demonstrates  regional wall motion abnormalities (see scoring diagram/findings for  description). Left ventricular diastolic  parameters are consistent with Grade I diastolic dysfunction (impaired  relaxation). There is akinesis of the left ventricular, apical septal wall  and inferior wall. There is akinesis of the left ventricular, apical  segment.   2. Right ventricular systolic function is normal. The right ventricular  size is normal.   3. The mitral valve is normal in structure. No evidence of mitral valve  regurgitation. No evidence of mitral stenosis.   4. The aortic valve is normal in structure. Aortic valve  regurgitation is  trivial. No aortic stenosis is present.   5. The inferior vena cava is normal in size with greater than 50%  respiratory variability, suggesting right atrial pressure of 3 mmHg.   Cardiac Cath 11/23/19 1.  Critical stenosis of the mid LAD and patient with anterior STEMI, treated successfully with PCI of the mid LAD using a drug-eluting stent (2.5 x 18 mm resolute Onyx) overlapped with the old bare-metal stent which remained patent. 2.  Severe first diagonal stenosis treated successfully with a 3.0 x 15 mm resolute Onyx DES 3.  Diffuse distal vessel coronary artery disease involving the LAD and PDA 4.  Mild nonobstructive left mainstem and left circumflex stenosis      Neuro/Psych        Claustrophobia Neuromuscular disease    GI/Hepatic Neg liver ROS, PUD,GERD  Medicated,,UC- controlled with sulfasalazine +entyvio    Endo/Other  diabetes, Well Controlled, Type 2, Oral Hypoglycemic Agents  Hyperlipidemia  Renal/GU negative Renal ROS   Hx/o Prostate Ca S/P TURP    Musculoskeletal  (+) Arthritis , Osteoarthritis,  Fibromyalgia -Ankylosing spondylitis   Abdominal Normal abdominal exam  (+)   Peds  Hematology  (+) Blood dyscrasia, anemia Plavix  therapy- last dose 7 days ago   Anesthesia Other Findings   Reproductive/Obstetrics                             Anesthesia Physical Anesthesia Plan  ASA: 3  Anesthesia Plan: General   Post-op Pain Management: Minimal or no pain anticipated   Induction: Intravenous  PONV Risk Score and Plan: 3 and Treatment may vary due to age or medical condition,  Ondansetron , TIVA and Propofol  infusion  Airway Management Planned: LMA  Additional Equipment: None  Intra-op Plan:   Post-operative Plan: Extubation in OR  Informed Consent: I have reviewed the patients History and Physical, chart, labs and discussed the procedure including the risks, benefits and alternatives for the proposed anesthesia  with the patient or authorized representative who has indicated his/her understanding and acceptance.     Dental advisory given  Plan Discussed with: Anesthesiologist and CRNA  Anesthesia Plan Comments: (PAT note written by Ella Gun, PA-C. H&P scanned under Media tab and dated 08/22/23.   )       Anesthesia Quick Evaluation

## 2023-08-27 ENCOUNTER — Encounter (HOSPITAL_COMMUNITY)
Admission: RE | Disposition: A | Discharge status: Home / Self Care | Payer: Self-pay | Source: Home / Self Care | Attending: Urology

## 2023-08-27 ENCOUNTER — Ambulatory Visit (HOSPITAL_COMMUNITY)
Admission: RE | Admit: 2023-08-27 | Discharge: 2023-08-27 | Disposition: A | Discharge status: Home / Self Care | Source: Ambulatory Visit | Attending: Urology | Admitting: Urology

## 2023-08-27 ENCOUNTER — Ambulatory Visit (HOSPITAL_COMMUNITY)
Admission: RE | Admit: 2023-08-27 | Discharge: 2023-08-27 | Disposition: A | Discharge status: Home / Self Care | Attending: Urology | Admitting: Urology

## 2023-08-27 ENCOUNTER — Encounter (HOSPITAL_COMMUNITY): Payer: Self-pay | Admitting: Urology

## 2023-08-27 ENCOUNTER — Ambulatory Visit (HOSPITAL_COMMUNITY): Payer: Self-pay | Admitting: Vascular Surgery

## 2023-08-27 DIAGNOSIS — R310 Gross hematuria: Secondary | ICD-10-CM | POA: Diagnosis not present

## 2023-08-27 DIAGNOSIS — C61 Malignant neoplasm of prostate: Secondary | ICD-10-CM | POA: Diagnosis not present

## 2023-08-27 DIAGNOSIS — I10 Essential (primary) hypertension: Secondary | ICD-10-CM | POA: Diagnosis not present

## 2023-08-27 DIAGNOSIS — N323 Diverticulum of bladder: Secondary | ICD-10-CM | POA: Insufficient documentation

## 2023-08-27 DIAGNOSIS — Z8546 Personal history of malignant neoplasm of prostate: Secondary | ICD-10-CM | POA: Diagnosis not present

## 2023-08-27 DIAGNOSIS — N400 Enlarged prostate without lower urinary tract symptoms: Secondary | ICD-10-CM | POA: Diagnosis not present

## 2023-08-27 DIAGNOSIS — I2510 Atherosclerotic heart disease of native coronary artery without angina pectoris: Secondary | ICD-10-CM | POA: Diagnosis not present

## 2023-08-27 HISTORY — PX: RADIOLOGY WITH ANESTHESIA: SHX6223

## 2023-08-27 LAB — BASIC METABOLIC PANEL WITH GFR
Anion gap: 9 (ref 5–15)
BUN: 24 mg/dL — ABNORMAL HIGH (ref 8–23)
CO2: 23 mmol/L (ref 22–32)
Calcium: 9.5 mg/dL (ref 8.9–10.3)
Chloride: 105 mmol/L (ref 98–111)
Creatinine, Ser: 0.92 mg/dL (ref 0.61–1.24)
GFR, Estimated: >60 mL/min (ref >60)
Glucose, Bld: 151 mg/dL — ABNORMAL HIGH (ref 70–99)
Potassium: 3.7 mmol/L (ref 3.5–5.1)
Sodium: 137 mmol/L (ref 135–145)

## 2023-08-27 LAB — CBC
HCT: 42.5 % (ref 39.0–52.0)
Hemoglobin: 14 g/dL (ref 13.0–17.0)
MCH: 29 pg (ref 26.0–34.0)
MCHC: 32.9 g/dL (ref 30.0–36.0)
MCV: 88 fL (ref 80.0–100.0)
Platelets: 388 10*3/uL (ref 150–400)
RBC: 4.83 MIL/uL (ref 4.22–5.81)
RDW: 13.6 % (ref 11.5–15.5)
WBC: 10.8 10*3/uL — ABNORMAL HIGH (ref 4.0–10.5)
nRBC: 0 % (ref 0.0–0.2)

## 2023-08-27 SURGERY — MRI WITH ANESTHESIA
Anesthesia: General

## 2023-08-27 MED ORDER — ONDANSETRON HCL 4 MG/2ML IJ SOLN: 4.0000 mg | Freq: Once | INTRAMUSCULAR | Status: DC | PRN

## 2023-08-27 MED ORDER — ORAL CARE MOUTH RINSE: 15.0000 mL | Freq: Once | OROMUCOSAL | Status: AC

## 2023-08-27 MED ORDER — OXYCODONE HCL 5 MG/5ML PO SOLN: 5.0000 mg | Freq: Once | ORAL | Status: DC | PRN

## 2023-08-27 MED ORDER — OXYCODONE HCL 5 MG PO TABS: 5.0000 mg | ORAL_TABLET | Freq: Once | ORAL | Status: DC | PRN

## 2023-08-27 MED ORDER — CHLORHEXIDINE GLUCONATE 0.12 % MT SOLN
15.0000 mL | Freq: Once | OROMUCOSAL | Status: AC
  Administered 2023-08-27: 15 mL via OROMUCOSAL

## 2023-08-27 MED ORDER — LIDOCAINE 2% (20 MG/ML) 5 ML SYRINGE
INTRAMUSCULAR | Status: DC | PRN
  Administered 2023-08-27: 60 mg via INTRAVENOUS

## 2023-08-27 MED ORDER — ONDANSETRON HCL 4 MG/2ML IJ SOLN
INTRAMUSCULAR | Status: DC | PRN
  Administered 2023-08-27: 4 mg via INTRAVENOUS

## 2023-08-27 MED ORDER — PROPOFOL 10 MG/ML IV BOLUS
INTRAVENOUS | Status: DC | PRN
  Administered 2023-08-27: 150 mg via INTRAVENOUS

## 2023-08-27 MED ORDER — METOPROLOL SUCCINATE ER 50 MG PO TB24
50.0000 mg | ORAL_TABLET | Freq: Once | ORAL | Status: AC
  Administered 2023-08-27: 50 mg via ORAL
  Filled 2023-08-27: qty 1

## 2023-08-27 MED ORDER — GADOBUTROL 1 MMOL/ML IV SOLN
7.5000 mL | Freq: Once | INTRAVENOUS | Status: AC | PRN
  Administered 2023-08-27: 7.5 mL via INTRAVENOUS

## 2023-08-27 MED ORDER — LACTATED RINGERS IV SOLN: INTRAVENOUS | Status: DC

## 2023-08-27 MED ORDER — FENTANYL CITRATE (PF) 100 MCG/2ML IJ SOLN: 25.0000 ug | INTRAMUSCULAR | Status: DC | PRN

## 2023-08-27 NOTE — Anesthesia Procedure Notes (Signed)
 Procedure Name: LMA Insertion Date/Time: 08/27/2023 8:50 AM  Performed by: Hebert Littler, CRNAPre-anesthesia Checklist: Emergency Drugs available, Suction available, Patient being monitored and Patient identified Patient Re-evaluated:Patient Re-evaluated prior to induction Oxygen Delivery Method: Circle system utilized Preoxygenation: Pre-oxygenation with 100% oxygen Induction Type: IV induction LMA: LMA inserted LMA Size: 5.0 Placement Confirmation: positive ETCO2, CO2 detector and breath sounds checked- equal and bilateral Tube secured with: Tape

## 2023-08-27 NOTE — Anesthesia Postprocedure Evaluation (Signed)
 Anesthesia Post Note  Patient: [name]  Procedure(s) Performed: MRI WITH ANESTHESIA     Patient location during evaluation: PACU Anesthesia Type: General Level of consciousness: awake and alert and oriented Pain management: pain level controlled Vital Signs Assessment: post-procedure vital signs reviewed and stable Respiratory status: spontaneous breathing, nonlabored ventilation and respiratory function stable Cardiovascular status: blood pressure returned to baseline and stable Postop Assessment: no apparent nausea or vomiting Anesthetic complications: no   No notable events documented.  Last Vitals:  Vitals:   08/27/23 0930 08/27/23 0945  BP: (!) 135/95 (!) 135/94  Pulse: (!) 104 94  Resp: 19 15  Temp:  36.5 C  SpO2: 97% 98%    Last Pain:  Vitals:   08/27/23 0945  TempSrc:   PainSc: 0-No pain                 Ricca Melgarejo A.

## 2023-08-27 NOTE — Transfer of Care (Signed)
 Immediate Anesthesia Transfer of Care Note  Patient: [name]  Procedure(s) Performed: MRI WITH ANESTHESIA  Patient Location: PACU  Anesthesia Type:General  Level of Consciousness: awake, alert , oriented, and patient cooperative  Airway & Oxygen Therapy: Patient Spontanous Breathing and Patient connected to face mask oxygen  Post-op Assessment: Report given to RN, Post -op Vital signs reviewed and stable, Patient moving all extremities, Patient moving all extremities X 4, and Patient able to stick tongue midline  Post vital signs: Reviewed and stable  Last Vitals:  Vitals Value Taken Time  BP 148/101 08/27/23 0930  Temp 36.5 C 08/27/23 0925  Pulse 101 08/27/23 0931  Resp 13 08/27/23 0931  SpO2 96 % 08/27/23 0931  Vitals shown include unfiled device data.  Last Pain:  Vitals:   08/27/23 0925  TempSrc:   PainSc: 0-No pain         Complications: No notable events documented.

## 2023-08-28 ENCOUNTER — Encounter (HOSPITAL_COMMUNITY): Payer: Self-pay | Admitting: Radiology

## 2023-08-28 LAB — GLUCOSE, CAPILLARY: Glucose-Capillary: 148 mg/dL — ABNORMAL HIGH (ref 70–99)

## 2023-08-29 DIAGNOSIS — N200 Calculus of kidney: Secondary | ICD-10-CM | POA: Diagnosis not present

## 2023-08-29 DIAGNOSIS — R310 Gross hematuria: Secondary | ICD-10-CM | POA: Diagnosis not present

## 2023-09-02 DIAGNOSIS — N393 Stress incontinence (female) (male): Secondary | ICD-10-CM | POA: Diagnosis not present

## 2023-09-02 DIAGNOSIS — M62838 Other muscle spasm: Secondary | ICD-10-CM | POA: Diagnosis not present

## 2023-09-02 DIAGNOSIS — M6281 Muscle weakness (generalized): Secondary | ICD-10-CM | POA: Diagnosis not present

## 2023-09-04 ENCOUNTER — Ambulatory Visit: Attending: Cardiology | Admitting: Student

## 2023-09-04 DIAGNOSIS — Z01810 Encounter for preprocedural cardiovascular examination: Secondary | ICD-10-CM | POA: Diagnosis not present

## 2023-09-04 NOTE — Progress Notes (Signed)
 Virtual Visit via Telephone Note   Because of [name]'s co-morbid illnesses, he is at least at moderate risk for complications without adequate follow up.  This format is felt to be most appropriate for this patient at this time.  The patient did not have access to video technology/had technical difficulties with video requiring transitioning to audio format only (telephone).  All issues noted in this document were discussed and addressed.  No physical exam could be performed with this format.  Please refer to the patient's chart for his consent to telehealth for [facility].  Evaluation Performed:  Preoperative cardiovascular risk assessment _____________   Date:  09/04/2023   Patient ID:  [name], DOB February 13, 1961, MRN [id number] Patient Location:  Home Provider location:   Office  Primary Care Provider:  Arnetha Bhat, NP Primary Cardiologist:  Cody Das, MD  Chief Complaint / Patient Profile   63 y.o. y/o male with a h/o CAD s/p BMS to LAD October 2008 and PCI with DES overlapping previous BMS to mid LAD September 2021, hyperlipidemia, asthma, GERD, T2DM who is pending radical robotic prostatectomy by Dr. Marlou Sims and presents today for telephonic preoperative cardiovascular risk assessment.  History of Present Illness    [name] is a 63 y.o. male who presents via audio/video conferencing for a telehealth visit today.  Pt was last seen in cardiology clinic on 05/31/2023 by Dr. Filiberto Hug.  At that time [name] was stable from a cardiac standpoint.  The patient is now pending procedure as outlined above. Since his last visit, he has no cardiac complaints. Patient denies shortness of breath, dyspnea on exertion, lower extremity edema, orthopnea or PND. No chest pain, pressure, or tightness. No palpitations. Prior to March, he was going to the gym three days a week. Since being out of his gym routine he is walking his dog for exercise about  1/2 mile a day.   Past Medical History    Past Medical History:  Diagnosis Date   Anemia    Ankylosing spondylitis (HCC)    Arthritis    Asthma    SINCE AGE 62   Colitis, ulcerative (HCC)    Coronary artery disease    Bare metal LAD stent 10/08   Coronary atherosclerosis of native coronary artery 12/25/2012   Diabetes mellitus without complication [facility])    ED (erectile dysfunction)    Elevated prostate specific antigen (PSA) 12/25/2012   Esophageal reflux 12/25/2012   Extrinsic asthma, unspecified 12/25/2012   Fibromyalgia    GERD (gastroesophageal reflux disease)    History of heart attack    Hypercalcemia 12/25/2012   Hyperlipidemia    Left sided ulcerative (chronic) colitis (HCC) 12/25/2012   Mixed hyperlipidemia 12/25/2012   Myocardial infarction [facility])    Pneumonia    as a child   Prostate cancer (HCC) 06/06/2023   Pure hypercholesterolemia 12/25/2012   Skin cancer of eyelid    TMJ (dislocation of temporomandibular joint)    Past Surgical History:  Procedure Laterality Date   COLONOSCOPY     CORONARY/GRAFT ACUTE MI REVASCULARIZATION N/A 11/23/2019   Procedure: Coronary/Graft Acute MI Revascularization;  Surgeon: Arnoldo Lapping, MD;  Location: [facility] INVASIVE CV LAB;  Service: Cardiovascular;  Laterality: N/A;   EYE SURGERY Bilateral    EYE SURGERY     heart stent     LEFT HEART CATH AND CORONARY ANGIOGRAPHY N/A 11/23/2019   Procedure: LEFT HEART CATH AND CORONARY ANGIOGRAPHY;  Surgeon: Arnoldo Lapping,  MD;  Location: [facility] INVASIVE CV LAB;  Service: Cardiovascular;  Laterality: N/A;   PROSTATE BIOPSY N/A 06/06/2023   Procedure: BIOPSY, PROSTATE, RECTAL APPROACH, WITH US  GUIDANCE;  Surgeon: Thelbert Finner, MD;  Location: [facility] ORS;  Service: Urology;  Laterality: N/A;   RADIOLOGY WITH ANESTHESIA N/A 08/27/2023   Procedure: MRI WITH ANESTHESIA;  Surgeon: Radiologist, Medication, MD;  Location: [facility] OR;  Service: Radiology;  Laterality: N/A;  MRI OF PROSTATE WITH AND WITHOUT  CONTRAST   skin cancer removed from left eyelid      skull fracture after falling down stairs     TONSILLECTOMY     TRANSURETHRAL RESECTION OF BLADDER TUMOR N/A 06/06/2023   Procedure: Removal of bladder stone   ,Right ureteroscopy diagnostic cytology obtained. , Bladder biopsy near left ureteral orifice , Transurethral resection of prostate removal of median lobe. ,  Prostate biopsy, Urethral dilation;  Surgeon: Thelbert Finner, MD;  Location: [facility] ORS;  Service: Urology;  Laterality: N/A;  BILATERAL RETROGRADE PYELOGRAM, POSSIBLE GEMCITABINE INSTILLATION    Allergies  Allergies  Allergen Reactions   Rosuvastatin      Other Reaction(s): d/c'd due to elevated LFTs    Home Medications    Prior to Admission medications   Medication Sig Start Date End Date Taking? Authorizing Provider  albuterol  (VENTOLIN  HFA) 108 (90 Base) MCG/ACT inhaler INHALE 2 PUFFS INTO THE LUNGS EVERY 6 HOURS AS NEEDED FOR WHEEZING 08/23/22   Fargo, Amy E, NP  aspirin  81 MG tablet Take 81 mg by mouth 2 (two) times a week.    [provider]  clopidogrel  (PLAVIX ) 75 MG tablet TAKE 1 TABLET(75 MG) BY MOUTH DAILY 03/18/23   Fargo, Amy E, NP  cyclobenzaprine  (FLEXERIL ) 5 MG tablet Take 1 tablet (5 mg total) by mouth at bedtime. 09/06/22   Fargo, Amy E, NP  empagliflozin  (JARDIANCE ) 10 MG TABS tablet Take 1 tablet (10 mg total) by mouth daily before breakfast. 03/08/23   Fargo, Amy E, NP  Ferrous Gluconate (IRON 27 PO) Take 27 mg by mouth daily.    [provider]  gabapentin  (NEURONTIN ) 100 MG capsule Take 200 mg by mouth at bedtime. 11/28/16   [provider]  hyoscyamine  (ANASPAZ ) 0.125 MG TBDP disintergrating tablet Place 1 tablet (0.125 mg total) under the tongue every 6 (six) hours as needed for up to 20 doses. Patient not taking: Reported on 08/19/2023 06/06/23   Thelbert Finner, MD  icosapent  Ethyl (VASCEPA ) 1 g capsule TAKE 2 CAPSULE BY MOUTH TWICE DAILY 10/30/22   Lucendia Rusk, MD  lisinopril  (ZESTRIL ) 2.5 MG tablet Take 1 tablet (2.5 mg total) by mouth daily. 03/08/23   Fargo, Amy E, NP  loratadine  (CLARITIN ) 10 MG tablet Take 10 mg by mouth daily.    [provider]  metFORMIN  (GLUCOPHAGE -XR) 500 MG 24 hr tablet Take 2 tablets (1,000 mg total) by mouth daily with breakfast AND 1 tablet (500 mg total) daily with supper. 03/08/23   Fargo, Amy E, NP  metoprolol  succinate (TOPROL -XL) 50 MG 24 hr tablet TAKE 1 TABLET(50 MG) BY MOUTH DAILY WITH OR IMMEDIATELY FOLLOWING A MEAL 03/18/23   Fargo, Amy E, NP  Multiple Vitamins-Minerals (CENTRUM CARDIO) TABS Take 1 tablet by mouth 2 (two) times daily.    [provider]  nitroGLYCERIN  (NITROSTAT ) 0.4 MG SL tablet Place 1 tablet (0.4 mg total) under the tongue every 5 (five) minutes as needed for chest pain. 03/03/21   Lucendia Rusk, MD  pantoprazole  (PROTONIX )  40 MG tablet TAKE 1 TABLET(40 MG) BY MOUTH DAILY 03/18/23   Fargo, Amy E, NP  polyethylene glycol (MIRALAX ) 17 g packet Take 17 g by mouth daily. Patient not taking: Reported on 08/19/2023 06/06/23   Thelbert Finner, MD  REPATHA  SURECLICK 140 MG/ML SOAJ ADMINISTER 1 ML UNDER THE SKIN EVERY 14 DAYS 02/22/23   Hugh Madura, MD  sulfaSALAzine  (AZULFIDINE ) 500 MG EC tablet TAKE 2 TABLETS BY MOUTH TWICE DAILY 06/13/23   Dorsey, Ying C, MD  tamsulosin  (FLOMAX ) 0.4 MG CAPS capsule Take 1 capsule (0.4 mg total) by mouth daily after supper. Patient not taking: Reported on 08/19/2023 06/06/23   Thelbert Finner, MD  theophylline  (THEODUR) 300 MG 12 hr tablet TAKE 1 TABLET BY MOUTH TWICE DAILY 06/27/23   Fargo, Amy E, NP  vedolizumab  (ENTYVIO ) 300 MG injection Inject into the vein every 8 (eight) weeks. 04/19/22   [provider]    Physical Exam    Vital Signs:  [name] does not have vital signs available for review today.  Given telephonic nature of communication, physical exam is limited. AAOx3. NAD. Normal affect.  Speech and  respirations are unlabored.   Assessment & Plan    Primary Cardiologist: Cody Das, MD  Preoperative cardiovascular risk assessment.  Radical robotic prostatectomy by Dr. Marlou Sims on 09/26/2023.  Chart reviewed as part of pre-operative protocol coverage. According to the RCRI, patient has a 0.9-6% risk of MACE. Patient reports activity equivalent to 4.0 METS (walking doing 1/2 mile a day).   Given past medical history and time since last visit, based on ACC/AHA guidelines, [name] would be at acceptable risk for the planned procedure without further cardiovascular testing.   Patient was advised that if he develops new symptoms prior to surgery to contact our office to arrange a follow-up appointment.  he verbalized understanding.  Per office visit note from Dr. Filiberto Hug in March 2025: If malignant diagnosis and need for repeat surgeries/procedures, recommend resuming Aspirin  81 mg, hold 3-5 days before repeat procedure. If no malignancy found, recommend resuming Plavix  75 mg daily. Regardless of malignancy or not, he would not need to be on dual antiplatelet therapy in the future.  Patient reports he stopped Plavix  secondary to increased hematuria. He then was taking aspirin  2 times a week but continued to have hematuria so he stopped it altogether 2 weeks ago.    I will route this recommendation to the requesting party via Epic fax function.  Please call with questions.  Time:   Today, I have spent 5 minutes with the patient with telehealth technology discussing medical history, symptoms, and management plan.     Morey Ar, NP  09/04/2023, 8:40 AM

## 2023-09-10 ENCOUNTER — Other Ambulatory Visit (HOSPITAL_COMMUNITY): Payer: Self-pay

## 2023-09-10 ENCOUNTER — Other Ambulatory Visit: Payer: Self-pay | Admitting: Orthopedic Surgery

## 2023-09-10 DIAGNOSIS — M6281 Muscle weakness (generalized): Secondary | ICD-10-CM | POA: Diagnosis not present

## 2023-09-10 DIAGNOSIS — E1169 Type 2 diabetes mellitus with other specified complication: Secondary | ICD-10-CM

## 2023-09-12 ENCOUNTER — Encounter: Payer: Self-pay | Admitting: Orthopedic Surgery

## 2023-09-12 ENCOUNTER — Ambulatory Visit: Payer: BC Managed Care – PPO | Admitting: Orthopedic Surgery

## 2023-09-12 VITALS — BP 108/70 | HR 95 | Temp 98.4°F | Ht 68.0 in | Wt 162.0 lb

## 2023-09-12 DIAGNOSIS — E1142 Type 2 diabetes mellitus with diabetic polyneuropathy: Secondary | ICD-10-CM

## 2023-09-12 DIAGNOSIS — I2510 Atherosclerotic heart disease of native coronary artery without angina pectoris: Secondary | ICD-10-CM

## 2023-09-12 DIAGNOSIS — C61 Malignant neoplasm of prostate: Secondary | ICD-10-CM | POA: Diagnosis not present

## 2023-09-12 DIAGNOSIS — R634 Abnormal weight loss: Secondary | ICD-10-CM | POA: Diagnosis not present

## 2023-09-12 DIAGNOSIS — M450 Ankylosing spondylitis of multiple sites in spine: Secondary | ICD-10-CM

## 2023-09-12 DIAGNOSIS — K5190 Ulcerative colitis, unspecified, without complications: Secondary | ICD-10-CM

## 2023-09-12 DIAGNOSIS — J4530 Mild persistent asthma, uncomplicated: Secondary | ICD-10-CM

## 2023-09-12 LAB — HEMOGLOBIN A1C
Hgb A1c MFr Bld: 6.2 % — ABNORMAL HIGH (ref <5.7)
Mean Plasma Glucose: 131 mg/dL
eAG (mmol/L): 7.3 mmol/L

## 2023-09-12 MED ORDER — ICOSAPENT ETHYL 1 G PO CAPS: 2.0000 g | ORAL_CAPSULE | Freq: Two times a day (BID) | ORAL | 3 refills | Status: DC

## 2023-09-12 NOTE — Patient Instructions (Signed)
 Good luck with surgery  If you need anything please let me know  Check A1c ( diabetes level) today> will call with results 06/27  Discontinue Jardiance   We will plan to hold Plavix  and asa until recovery from prostate surgery

## 2023-09-12 NOTE — Progress Notes (Signed)
 Careteam: Patient Care Team: Gil Greig BRAVO, NP as PCP - General (Adult Health Nurse Practitioner) Elmira Newman PARAS, MD as PCP - Cardiology (Cardiology)  Seen by: Greig Gil, AGNP-C  PLACE OF SERVICE:  [facility]  Advanced Directive information    Allergies  Allergen Reactions   Rosuvastatin      Other Reaction(s): d/c'd due to elevated LFTs    Chief Complaint  Patient presents with   Follow-up    6 month follow up Discuss the need for Ophthalmology exam, Foot exam and shingles     HPI: Patient is a 63 y.o. male seen today for medical management of chronic conditions.   Discussed the use of AI scribe software for clinical note transcription with the patient, who gave verbal consent to proceed.  History of Present Illness    Intermittent hematuria and voiding issues x 6 months. Followed by urology. He has been diagnosed with stage prostate cancer, with a recent CT scan showing no metastasis. He has recently stopped taking Plavix  and baby aspirin  due to hematuria. He is scheduled for prostatectomy on July 10th. Remains on Flomax .   He experiences dysuria, characterized by pain, a burning sensation, and difficulty controlling the urinary stream, which sometimes results in spraying. He is unable to stop midstream and experiences a burning sensation at the start of urination about 90% of the time.  He has diabetes, with a good A1c three months ago. He recently stopped taking Jardiance  due to concerns about its effects on urination post-surgery. He is currently taking metformin , two in the morning and one in the evening. He has lost seven pounds since his last visit.  He has hearing issues with no improvement and last saw an audiologist in March or April. He plans to address this after his surgery. He has not had his eyes checked in two years but reports no significant changes in vision, using reading glasses as needed.     No recent asthma exacerbations. Remains on  theophylline  and albuterol .   Followed by GI for UC> no flares. Remains on Entyvio  infusions, sulfasalazine .   Ankylosing spondylitis stable with gabapentin  and flexeril .    Review of Systems:  Review of Systems  Constitutional:  Positive for weight loss.  HENT:  Positive for hearing loss.   Respiratory: Negative.    Cardiovascular: Negative.   Gastrointestinal: Negative.   Genitourinary:  Positive for frequency and hematuria.  Musculoskeletal: Negative.   Skin: Negative.   Neurological: Negative.   Psychiatric/Behavioral:  Negative for depression. The patient is not nervous/anxious.     Past Medical History:  Diagnosis Date   Anemia    Ankylosing spondylitis (HCC)    Arthritis    Asthma    SINCE AGE 26   Colitis, ulcerative (HCC)    Coronary artery disease    Bare metal LAD stent 10/08   Coronary atherosclerosis of native coronary artery 12/25/2012   Diabetes mellitus without complication [facility])    ED (erectile dysfunction)    Elevated prostate specific antigen (PSA) 12/25/2012   Esophageal reflux 12/25/2012   Extrinsic asthma, unspecified 12/25/2012   Fibromyalgia    GERD (gastroesophageal reflux disease)    History of heart attack    Hypercalcemia 12/25/2012   Hyperlipidemia    Left sided ulcerative (chronic) colitis (HCC) 12/25/2012   Mixed hyperlipidemia 12/25/2012   Myocardial infarction [facility])    Pneumonia    as a child   Prostate cancer (HCC) 06/06/2023   Pure hypercholesterolemia 12/25/2012   Skin  cancer of eyelid    TMJ (dislocation of temporomandibular joint)    Past Surgical History:  Procedure Laterality Date   COLONOSCOPY     CORONARY/GRAFT ACUTE MI REVASCULARIZATION N/A 11/23/2019   Procedure: Coronary/Graft Acute MI Revascularization;  Surgeon: Wonda Sharper, MD;  Location: [facility] INVASIVE CV LAB;  Service: Cardiovascular;  Laterality: N/A;   EYE SURGERY Bilateral    EYE SURGERY     heart stent     LEFT HEART CATH AND CORONARY ANGIOGRAPHY N/A  11/23/2019   Procedure: LEFT HEART CATH AND CORONARY ANGIOGRAPHY;  Surgeon: Wonda Sharper, MD;  Location: [facility] INVASIVE CV LAB;  Service: Cardiovascular;  Laterality: N/A;   PROSTATE BIOPSY N/A 06/06/2023   Procedure: BIOPSY, PROSTATE, RECTAL APPROACH, WITH US  GUIDANCE;  Surgeon: Shane Steffan BROCKS, MD;  Location: [facility] ORS;  Service: Urology;  Laterality: N/A;   RADIOLOGY WITH ANESTHESIA N/A 08/27/2023   Procedure: MRI WITH ANESTHESIA;  Surgeon: Radiologist, Medication, MD;  Location: [facility] OR;  Service: Radiology;  Laterality: N/A;  MRI OF PROSTATE WITH AND WITHOUT CONTRAST   skin cancer removed from left eyelid      skull fracture after falling down stairs     TONSILLECTOMY     TRANSURETHRAL RESECTION OF BLADDER TUMOR N/A 06/06/2023   Procedure: Removal of bladder stone   ,Right ureteroscopy diagnostic cytology obtained. , Bladder biopsy near left ureteral orifice , Transurethral resection of prostate removal of median lobe. ,  Prostate biopsy, Urethral dilation;  Surgeon: Shane Steffan BROCKS, MD;  Location: [facility] ORS;  Service: Urology;  Laterality: N/A;  BILATERAL RETROGRADE PYELOGRAM, POSSIBLE GEMCITABINE INSTILLATION   Social History:   reports that he has never smoked. He has quit using smokeless tobacco.  His smokeless tobacco use included snuff. He reports current alcohol use. He reports that he does not use drugs.  Family History  Problem Relation Age of Onset   Hypertension Mother    Lung cancer Father    Lung cancer Sister    GER disease Sister    Brain cancer Sister    Diabetes Maternal Grandmother    Emphysema Maternal Grandfather    COPD Maternal Grandfather    Pancreatic cancer Neg Hx    Stomach cancer Neg Hx    Colon cancer Neg Hx    Esophageal cancer Neg Hx    Colon polyps Neg Hx    Heart disease Neg Hx     Medications: Patient's Medications  New Prescriptions   No medications on file  Previous Medications   ALBUTEROL  (VENTOLIN  HFA) 108 (90 BASE) MCG/ACT INHALER     INHALE 2 PUFFS INTO THE LUNGS EVERY 6 HOURS AS NEEDED FOR WHEEZING   CYCLOBENZAPRINE  (FLEXERIL ) 5 MG TABLET    Take 1 tablet (5 mg total) by mouth at bedtime.   FERROUS GLUCONATE (IRON 27 PO)    Take 27 mg by mouth daily.   GABAPENTIN  (NEURONTIN ) 100 MG CAPSULE    Take 200 mg by mouth at bedtime.   HYOSCYAMINE  (ANASPAZ ) 0.125 MG TBDP DISINTERGRATING TABLET    Place 1 tablet (0.125 mg total) under the tongue every 6 (six) hours as needed for up to 20 doses.   LISINOPRIL  (ZESTRIL ) 2.5 MG TABLET    Take 1 tablet (2.5 mg total) by mouth daily.   LORATADINE  (CLARITIN ) 10 MG TABLET    Take 10 mg by mouth daily.   METFORMIN  (GLUCOPHAGE -XR) 500 MG 24 HR TABLET    Take 2 tablets (1,000 mg total) by mouth daily with breakfast AND  1 tablet (500 mg total) daily with supper.   METOPROLOL  SUCCINATE (TOPROL -XL) 50 MG 24 HR TABLET    TAKE 1 TABLET(50 MG) BY MOUTH DAILY WITH OR IMMEDIATELY FOLLOWING A MEAL   MULTIPLE VITAMINS-MINERALS (CENTRUM CARDIO) TABS    Take 1 tablet by mouth 2 (two) times daily.   NITROGLYCERIN  (NITROSTAT ) 0.4 MG SL TABLET    Place 1 tablet (0.4 mg total) under the tongue every 5 (five) minutes as needed for chest pain.   PANTOPRAZOLE  (PROTONIX ) 40 MG TABLET    TAKE 1 TABLET(40 MG) BY MOUTH DAILY   REPATHA  SURECLICK 140 MG/ML SOAJ    ADMINISTER 1 ML UNDER THE SKIN EVERY 14 DAYS   SULFASALAZINE  (AZULFIDINE ) 500 MG EC TABLET    TAKE 2 TABLETS BY MOUTH TWICE DAILY   TAMSULOSIN  (FLOMAX ) 0.4 MG CAPS CAPSULE    Take 1 capsule (0.4 mg total) by mouth daily after supper.   THEOPHYLLINE  (THEODUR) 300 MG 12 HR TABLET    TAKE 1 TABLET BY MOUTH TWICE DAILY   VEDOLIZUMAB  (ENTYVIO ) 300 MG INJECTION    Inject into the vein every 8 (eight) weeks.  Modified Medications   Modified Medication Previous Medication   ICOSAPENT  ETHYL (VASCEPA ) 1 G CAPSULE icosapent  Ethyl (VASCEPA ) 1 g capsule      Take 2 capsules (2 g total) by mouth 2 (two) times daily.    TAKE 2 CAPSULE BY MOUTH TWICE DAILY  Discontinued  Medications   ASPIRIN  81 MG TABLET    Take 81 mg by mouth 2 (two) times a week.   CLOPIDOGREL  (PLAVIX ) 75 MG TABLET    TAKE 1 TABLET(75 MG) BY MOUTH DAILY   JARDIANCE  10 MG TABS TABLET    TAKE 1 TABLET(10 MG) BY MOUTH DAILY BEFORE BREAKFAST   POLYETHYLENE GLYCOL (MIRALAX ) 17 G PACKET    Take 17 g by mouth daily.    Physical Exam:  Vitals:   09/12/23 1045  BP: 108/70  Pulse: 95  Temp: 98.4 F (36.9 C)  SpO2: 98%  Weight: 162 lb (73.5 kg)  Height: 5' 8 (1.727 m)   Body mass index is 24.63 kg/m. Wt Readings from Last 3 Encounters:  09/12/23 162 lb (73.5 kg)  08/27/23 155 lb (70.3 kg)  06/14/23 167 lb 12.8 oz (76.1 kg)    Physical Exam Vitals reviewed.  Constitutional:      General: He is not in acute distress. HENT:     Head: Normocephalic.   Eyes:     General:        Right eye: No discharge.        Left eye: No discharge.    Cardiovascular:     Rate and Rhythm: Normal rate and regular rhythm.     Pulses: Normal pulses.     Heart sounds: Normal heart sounds.  Pulmonary:     Effort: Pulmonary effort is normal.     Breath sounds: Normal breath sounds.  Abdominal:     General: There is no distension.     Palpations: Abdomen is soft.     Tenderness: There is no abdominal tenderness.   Musculoskeletal:     Cervical back: Neck supple.     Right lower leg: No edema.     Left lower leg: No edema.   Skin:    General: Skin is warm.     Capillary Refill: Capillary refill takes less than 2 seconds.   Neurological:     General: No focal deficit present.  Mental Status: He is alert.   Psychiatric:        Mood and Affect: Mood normal.     Labs reviewed: Basic Metabolic Panel: Recent Labs    03/07/23 1145 06/06/23 1230 08/27/23 0554  NA 138 136 137  K 4.6 3.9 3.7  CL 103 105 105  CO2 28 20* 23  GLUCOSE 179* 128* 151*  BUN 19 20 24*  CREATININE 0.92 0.75 0.92  CALCIUM  9.7 9.7 9.5   Liver Function Tests: No results for input(s): AST, ALT,  ALKPHOS, BILITOT, PROT, ALBUMIN in the last 8760 hours. No results for input(s): LIPASE, AMYLASE in the last 8760 hours. No results for input(s): AMMONIA in the last 8760 hours. CBC: Recent Labs    08/27/23 0554  WBC 10.8*  HGB 14.0  HCT 42.5  MCV 88.0  PLT 388   Lipid Panel: Recent Labs    03/07/23 1145  CHOL 105  HDL 40  LDLCALC 47  TRIG 92  CHOLHDL 2.6   TSH: No results for input(s): TSH in the last 8760 hours. A1C: Lab Results  Component Value Date   HGBA1C 5.6 06/06/2023     Assessment/Plan 1. Prostate cancer (HCC) (Primary) - intermittent hematuria x 6 months - followed by urology - 07/10 scheduled prostatectomy  - recent hgb 14.0 08/27/2023 - cont Flomax   2. Type 2 diabetes mellitus with diabetic polyneuropathy, without long-term current use of insulin  (HCC) - A1c 6.2> was 5.6 - lost 20 lbs within 6 months - off Jardiance  due to weight loss, concerns for urinary complications due to MOA of medicine  - reduce metformin  to 1 tablet po BID - foot exam done today - discuss yearly eye exam next routine visit - Hemoglobin A1c - metFORMIN  (GLUCOPHAGE -XR) 500 MG 24 hr tablet; Take 1 tablet (500 mg total) by mouth 2 (two) times daily with a meal.  Dispense: 180 tablet; Refill: 2  3. Atherosclerosis of native coronary artery of native heart without angina pectoris - followed by cardiology - LDL 47 02/2023 - on Repatha  - stopped asa and Plavix  due to hematuria - plan to restart after prostate surgery  4. Weight loss - weight 186 lbs (12/06)> now 162 lbs  - associated with recent prostate cancer  5. Mild persistent asthma without complication - no exacerbations - cont theophylline  and albuterol   6. Ulcerative colitis without complications, unspecified location (HCC) - followed by GI - cont Entyvio  infusions, sulfasalazine   7. Ankylosing spondylitis of multiple sites in spine (HCC) - cont gabapentin  and flexeril   Total time: 36  minutes. Greater than 50% of total time spent doing patient education regarding health maintenance, hematuria, prostate cancer, T2DM, UC and weight loss including symptom/medication management.     Next appt: 01/16/2024  Greig Gil BODILY  [facility] [phone]

## 2023-09-13 ENCOUNTER — Ambulatory Visit: Payer: Self-pay | Admitting: Orthopedic Surgery

## 2023-09-13 MED ORDER — METFORMIN HCL ER 500 MG PO TB24: 500.0000 mg | ORAL_TABLET | Freq: Two times a day (BID) | ORAL | 2 refills | Status: DC

## 2023-09-15 ENCOUNTER — Other Ambulatory Visit (HOSPITAL_COMMUNITY): Payer: Self-pay | Admitting: *Deleted

## 2023-09-15 NOTE — Patient Instructions (Signed)
 SURGICAL WAITING ROOM VISITATION Patients having surgery or a procedure may have no more than 2 support people in the waiting area - these visitors may rotate in the visitor waiting room.   If the patient needs to stay at the hospital during part of their recovery, the visitor guidelines for inpatient rooms apply.  PRE-OP VISITATION  Pre-op nurse will coordinate an appropriate time for 1 support person to accompany the patient in pre-op.  This support person may not rotate.  This visitor will be contacted when the time is appropriate for the visitor to come back in the pre-op area.  Please refer to the [facility] website for the visitor guidelines for Inpatients (after your surgery is over and you are in a regular room).  You are not required to quarantine at this time prior to your surgery. However, you must do this: Hand Hygiene often Do NOT share personal items Notify your provider if you are in close contact with someone who has COVID or you develop fever 100.4 or greater, new onset of sneezing, cough, sore throat, shortness of breath or body aches.  If you test positive for Covid or have been in contact with anyone that has tested positive in the last 10 days please notify you surgeon.    Your procedure is scheduled on:  Thursday  September 26, 2023  Report to [facility] Main Entrance: [address] entrance where the [employer] is available.   Report to admitting at: 07:15 AM  Call this number if you have any questions or problems the morning of surgery [phone]  DO NOT EAT OR DRINK ANYTHING AFTER MIDNIGHT THE NIGHT PRIOR TO YOUR SURGERY / PROCEDURE.   FOLLOW  ANY ADDITIONAL PRE OP INSTRUCTIONS YOU RECEIVED FROM YOUR SURGEON'S OFFICE!!!  MAGNESIUM CITRATE:  Obtain one (1)  bottle (10 oz) of Magnesium Citrate at your pharmacy. Drink entire bottle at 12:00 noon the day before your surgery/ procedure.  FLEET ENEMA: Obtain one(1) Fleet Enema (sodium phosphate  7-19 gm / 118 ml  enema) and use (according to the directions on the box) the night prior to your surgery.   If you have any questions, please contact your Surgeon's office for additional information.     Oral Hygiene is also important to reduce your risk of infection.        Remember - BRUSH YOUR TEETH THE MORNING OF SURGERY WITH YOUR REGULAR TOOTHPASTE  Do NOT smoke after Midnight the night before surgery.  STOP TAKING all Vitamins, Herbs and supplements 1 week before your surgery.   METFORMIN - Day BEFORE surgery, take as usual. DO NOT TAKE METFORMIN  on the Day of your surgery.  Take ONLY these medicines the morning of surgery with A SIP OF WATER : Pantoprazole , metoprolol , loratadine . You may use your Albuterol  inhaler if needed. Please bring this inhaler with you on the day of surgery.                    You may not have any metal on your body including jewelry, and body piercing  Do not wear lotions, powders, cologne, or deodorant  Men may shave face and neck.  Contacts, Hearing Aids, dentures or bridgework may not be worn into surgery. DENTURES WILL BE REMOVED PRIOR TO SURGERY PLEASE DO NOT APPLY Poly grip OR ADHESIVES!!!  You may bring a small overnight bag with you on the day of surgery, only pack items that are not valuable. [facility] IS NOT RESPONSIBLE   FOR VALUABLES  THAT ARE LOST OR STOLEN.   Do not bring your home medications to the hospital. The Pharmacy will dispense medications listed on your medication list to you during your admission in the Hospital.  Please read over the following fact sheets you were given: IF YOU HAVE QUESTIONS ABOUT YOUR PRE-OP INSTRUCTIONS, PLEASE CALL [phone].   [place] - Preparing for Surgery Before surgery, you can play an important role.  Because skin is not sterile, your skin needs to be as free of germs as possible.  You can reduce the number of germs on your skin by washing with CHG (chlorahexidine gluconate) soap before surgery.  CHG is an  antiseptic cleaner which kills germs and bonds with the skin to continue killing germs even after washing. Please DO NOT use if you have an allergy to CHG or antibacterial soaps.  If your skin becomes reddened/irritated stop using the CHG and inform your nurse when you arrive at Short Stay. Do not shave (including legs and underarms) for at least 48 hours prior to the first CHG shower.  You may shave your face/neck.  Please follow these instructions carefully:  1.  Shower with CHG Soap the night before surgery and the  morning of surgery.  2.  If you choose to wash your hair, wash your hair first as usual with your normal  shampoo.  3.  After you shampoo, rinse your hair and body thoroughly to remove the shampoo.                             4.  Use CHG as you would any other liquid soap.  You can apply chg directly to the skin and wash.  Gently with a scrungie or clean washcloth.  5.  Apply the CHG Soap to your body ONLY FROM THE NECK DOWN.   Do not use on face/ open                           Wound or open sores. Avoid contact with eyes, ears mouth and genitals (private parts).                       Wash face,  Genitals (private parts) with your normal soap.             6.  Wash thoroughly, paying special attention to the area where your  surgery  will be performed.  7.  Thoroughly rinse your body with warm water  from the neck down.  8.  DO NOT shower/wash with your normal soap after using and rinsing off the CHG Soap.            9.  Pat yourself dry with a clean towel.            10.  Wear clean pajamas.            11.  Place clean sheets on your bed the night of your first shower and do not  sleep with pets.  ON THE DAY OF SURGERY : Do not apply any lotions/deodorants the morning of surgery.  Please wear clean clothes to the hospital/surgery center.    FAILURE TO FOLLOW THESE INSTRUCTIONS MAY RESULT IN THE CANCELLATION OF YOUR SURGERY  PATIENT  SIGNATURE_________________________________  NURSE SIGNATURE__________________________________  ________________________________________________________________________

## 2023-09-15 NOTE — Progress Notes (Signed)
 COVID Vaccine received:  []  No [x]  Yes Date of any COVID positive Test in last 90 days:  PCP - Greig Cluster, NP  Cardiologist - Newman Lawrence, MD, Barnie Hila, NP  cardiac clearance in 09-04-23  Epic note Rheumatology- Lynwood Ramsay, MD   Chest x-ray - 08-07-2020  2v  Epic EKG -  05-31-2023  Epic Stress Test -  ECHO - 11-24-2019  Epic Cardiac Cath - 2008 LHC- BMS x 1, 12-03-2019 LHC- DES x 1 overlap of BMS CT Coronary Calcium  score:   Bowel Prep - []  No  [x]   Yes _mag citrate / Fleet_____  Pacemaker / ICD device [x]  No []  Yes   Spinal Cord Stimulator:[x]  No []  Yes       History of Sleep Apnea? [x]  No []  Yes   CPAP used?- [x]  No []  Yes    Does the patient monitor blood sugar?   []  N/A   []  No []  Yes  Patient has: []  NO Hx DM   []  Pre-DM   [x]  DM1  []   DM2 Last A1c was: 6.2  on 09-12-2023     Does patient have a [employer] or Dexcom? []  No []  Yes   Fasting Blood Sugar Ranges-  Checks Blood Sugar _____ times a day  Blood Thinner / Instructions:  none Aspirin  Instructions:  ASA 81 mg??  Hold 3-5 days  ERAS Protocol Ordered: [x]  No  []  Yes Patient is to be NPO after: [place] Prior  Dental hx: []  Dentures:  []  N/A      []  Bridge or Partial:                   []  Loose or Damaged teeth:   Comments:   Activity level: Able to walk up 2 flights of stairs without becoming significantly short of breath or having chest pain?  []  No   []    Yes   Anesthesia review:CAD- LHC w/ BMS x 1 (2008), STEMI- LHC- DES x 1 overlap BMS (11-23-2019) HTN, GERD, S/p eyelid reconstruction d/t skin cancer, ankylosing spondylitis, GERD, HOH-  ?   Patient denies shortness of breath, fever, cough and chest pain at PAT appointment.  Patient verbalized understanding and agreement to the Pre-Surgical Instructions that were given to them at this PAT appointment. Patient was also educated of the need to review these PAT instructions again prior to his surgery.I reviewed the appropriate phone numbers to call if  they have any and questions or concerns.

## 2023-09-17 ENCOUNTER — Other Ambulatory Visit: Payer: Self-pay

## 2023-09-17 ENCOUNTER — Encounter (HOSPITAL_COMMUNITY)
Admission: RE | Admit: 2023-09-17 | Discharge: 2023-09-17 | Disposition: A | Discharge status: Home / Self Care | Source: Ambulatory Visit | Attending: Urology | Admitting: Urology

## 2023-09-17 ENCOUNTER — Encounter (HOSPITAL_COMMUNITY): Payer: Self-pay

## 2023-09-17 DIAGNOSIS — C61 Malignant neoplasm of prostate: Secondary | ICD-10-CM | POA: Diagnosis not present

## 2023-09-17 DIAGNOSIS — K219 Gastro-esophageal reflux disease without esophagitis: Secondary | ICD-10-CM | POA: Diagnosis not present

## 2023-09-17 DIAGNOSIS — Z79899 Other long term (current) drug therapy: Secondary | ICD-10-CM | POA: Diagnosis not present

## 2023-09-17 DIAGNOSIS — Z01812 Encounter for preprocedural laboratory examination: Secondary | ICD-10-CM | POA: Insufficient documentation

## 2023-09-17 DIAGNOSIS — E118 Type 2 diabetes mellitus with unspecified complications: Secondary | ICD-10-CM | POA: Diagnosis not present

## 2023-09-17 DIAGNOSIS — I2510 Atherosclerotic heart disease of native coronary artery without angina pectoris: Secondary | ICD-10-CM | POA: Insufficient documentation

## 2023-09-17 DIAGNOSIS — Z7984 Long term (current) use of oral hypoglycemic drugs: Secondary | ICD-10-CM | POA: Insufficient documentation

## 2023-09-17 DIAGNOSIS — Z955 Presence of coronary angioplasty implant and graft: Secondary | ICD-10-CM | POA: Diagnosis not present

## 2023-09-17 DIAGNOSIS — Z01818 Encounter for other preprocedural examination: Secondary | ICD-10-CM

## 2023-09-17 DIAGNOSIS — M26609 Unspecified temporomandibular joint disorder, unspecified side: Secondary | ICD-10-CM | POA: Diagnosis not present

## 2023-09-17 DIAGNOSIS — I10 Essential (primary) hypertension: Secondary | ICD-10-CM | POA: Diagnosis not present

## 2023-09-17 HISTORY — DX: Essential (primary) hypertension: I10

## 2023-09-17 HISTORY — DX: Depression, unspecified: F32.A

## 2023-09-17 LAB — COMPREHENSIVE METABOLIC PANEL WITH GFR
ALT: 20 U/L (ref 0–44)
AST: 18 U/L (ref 15–41)
Albumin: 4.2 g/dL (ref 3.5–5.0)
Alkaline Phosphatase: 87 U/L (ref 38–126)
Anion gap: 10 (ref 5–15)
BUN: 22 mg/dL (ref 8–23)
CO2: 24 mmol/L (ref 22–32)
Calcium: 10.1 mg/dL (ref 8.9–10.3)
Chloride: 103 mmol/L (ref 98–111)
Creatinine, Ser: 0.89 mg/dL (ref 0.61–1.24)
GFR, Estimated: >60 mL/min (ref >60)
Glucose, Bld: 111 mg/dL — ABNORMAL HIGH (ref 70–99)
Potassium: 3.9 mmol/L (ref 3.5–5.1)
Sodium: 137 mmol/L (ref 135–145)
Total Bilirubin: 0.7 mg/dL (ref 0.0–1.2)
Total Protein: 7.2 g/dL (ref 6.5–8.1)

## 2023-09-17 LAB — CBC
HCT: 42.7 % (ref 39.0–52.0)
Hemoglobin: 13.8 g/dL (ref 13.0–17.0)
MCH: 29.1 pg (ref 26.0–34.0)
MCHC: 32.3 g/dL (ref 30.0–36.0)
MCV: 90.1 fL (ref 80.0–100.0)
Platelets: 330 10*3/uL (ref 150–400)
RBC: 4.74 MIL/uL (ref 4.22–5.81)
RDW: 14 % (ref 11.5–15.5)
WBC: 9.4 10*3/uL (ref 4.0–10.5)
nRBC: 0 % (ref 0.0–0.2)

## 2023-09-17 LAB — GLUCOSE, CAPILLARY: Glucose-Capillary: 107 mg/dL — ABNORMAL HIGH (ref 70–99)

## 2023-09-17 NOTE — Patient Instructions (Signed)
 SURGICAL WAITING ROOM VISITATION Patients having surgery or a procedure may have no more than 2 support people in the waiting area - these visitors may rotate in the visitor waiting room.   If the patient needs to stay at the hospital during part of their recovery, the visitor guidelines for inpatient rooms apply.   PRE-OP VISITATION  Pre-op nurse will coordinate an appropriate time for 1 support person to accompany the patient in pre-op.  This support person may not rotate.  This visitor will be contacted when the time is appropriate for the visitor to come back in the pre-op area.   Please refer to the [facility] website for the visitor guidelines for Inpatients (after your surgery is over and you are in a regular room).   You are not required to quarantine at this time prior to your surgery. However, you must do this: Hand Hygiene often Do NOT share personal items Notify your provider if you are in close contact with someone who has COVID or you develop fever 100.4 or greater, new onset of sneezing, cough, sore throat, shortness of breath or body aches.  If you test positive for Covid or have been in contact with anyone that has tested positive in the last 10 days please notify you surgeon.     Your procedure is scheduled on:  Thursday  September 26, 2023   Report to [facility] Main Entrance: [place] entrance where the [employer] is available.    Report to admitting at: 07:15 AM   Call this number if you have any questions or problems the morning of surgery [phone]   DO NOT EAT OR DRINK ANYTHING AFTER MIDNIGHT THE NIGHT PRIOR TO YOUR SURGERY / PROCEDURE.    FOLLOW  ANY ADDITIONAL PRE OP INSTRUCTIONS YOU RECEIVED FROM YOUR SURGEON'S OFFICE!!!   MAGNESIUM CITRATE:  Obtain one (1)  bottle (10 oz) of Magnesium Citrate at your pharmacy. Drink entire bottle at 12:00 noon the day before your surgery/ procedure.  FLEET ENEMA: Obtain one(1) Fleet Enema (sodium phosphate  7-19 gm  / 118 ml enema) and use (according to the directions on the box) the night prior to your surgery.    If you have any questions, please contact your Surgeon's office for additional information.       Oral Hygiene is also important to reduce your risk of infection.        Remember - BRUSH YOUR TEETH THE MORNING OF SURGERY WITH YOUR REGULAR TOOTHPASTE   Do NOT smoke after Midnight the night before surgery.   STOP TAKING all Vitamins, Herbs and supplements 1 week before your surgery.    METFORMIN - Day BEFORE surgery, take as usual. DO NOT TAKE METFORMIN  on the Day of your surgery.   Take ONLY these medicines the morning of surgery with A SIP OF WATER : Pantoprazole , metoprolol , loratadine . You may use your Albuterol  inhaler if needed. Please bring this inhaler with you on the day of surgery.                     You may not have any metal on your body including jewelry, and body piercing   Do not wear lotions, powders, cologne, or deodorant   Men may shave face and neck.   Contacts, Hearing Aids, dentures or bridgework may not be worn into surgery. DENTURES WILL BE REMOVED PRIOR TO SURGERY PLEASE DO NOT APPLY Poly grip OR ADHESIVES!!!   You may bring a small overnight bag with  you on the day of surgery, only pack items that are not valuable. [facility] IS NOT RESPONSIBLE   FOR VALUABLES THAT ARE LOST OR STOLEN.    Do not bring your home medications to the hospital. The Pharmacy will dispense medications listed on your medication list to you during your admission in the Hospital.   Please read over the following fact sheets you were given: IF YOU HAVE QUESTIONS ABOUT YOUR PRE-OP INSTRUCTIONS, PLEASE CALL [phone].     [facility] - Preparing for Surgery Before surgery, you can play an important role.  Because skin is not sterile, your skin needs to be as free of germs as possible.  You can reduce the number of germs on your skin by washing with CHG (chlorahexidine gluconate) soap  before surgery.  CHG is an antiseptic cleaner which kills germs and bonds with the skin to continue killing germs even after washing. Please DO NOT use if you have an allergy to CHG or antibacterial soaps.  If your skin becomes reddened/irritated stop using the CHG and inform your nurse when you arrive at Short Stay. Do not shave (including legs and underarms) for at least 48 hours prior to the first CHG shower.  You may shave your face/neck.   Please follow these instructions carefully:             1.  Shower with CHG Soap the night before surgery and the  morning of surgery.             2.  If you choose to wash your hair, wash your hair first as usual with your normal  shampoo.             3.  After you shampoo, rinse your hair and body thoroughly to remove the shampoo.                                           4.  Use CHG as you would any other liquid soap.  You can apply chg directly to the skin and wash.  Gently with a scrungie or clean washcloth.             5.  Apply the CHG Soap to your body ONLY FROM THE NECK DOWN.   Do not use on face/ open                           Wound or open sores. Avoid contact with eyes, ears mouth and genitals (private parts).                       Wash face,  Genitals (private parts) with your normal soap.             6.  Wash thoroughly, paying special attention to the area where your surgery  will be performed.             7.  Thoroughly rinse your body with warm water  from the neck down.             8.  DO NOT shower/wash with your normal soap after using and rinsing off the CHG Soap.            9.  Pat yourself dry with a clean towel.  10.  Wear clean pajamas.            11.  Place clean sheets on your bed the night of your first shower and do not  sleep with pets.   ON THE DAY OF SURGERY : Do not apply any lotions/deodorants the morning of surgery.  Please wear clean clothes to the hospital/surgery center.       FAILURE TO FOLLOW THESE  INSTRUCTIONS MAY RESULT IN THE CANCELLATION OF YOUR SURGERY   PATIENT SIGNATURE_________________________________   NURSE SIGNATURE__________________________________   ________________________________________________________________________

## 2023-09-17 NOTE — Progress Notes (Addendum)
 COVID Vaccine received:  []  No [x]  Yes Date of any COVID positive Test in last 70 days:None   PCP - Greig Cluster, NP  Cardiologist - Newman Lawrence, MD, Barnie Hila, NP  cardiac clearance in 09-04-23  Epic note Rheumatology- Lynwood Ramsay, MD    Chest x-ray - 08-07-2020  2v  Epic EKG -  05-31-2023  Epic Stress Test -  ECHO - 11-24-2019  Epic Cardiac Cath - 2008 LHC- BMS x 1, 12-03-2019 LHC- DES x 1 overlap of BMS CT Coronary Calcium  score:    Bowel Prep - []  No  [x]   Yes _mag citrate / Fleet_____   Pacemaker / ICD device [x]  No []  Yes   Spinal Cord Stimulator:[x]  No []  Yes       History of Sleep Apnea? [x]  No []  Yes   CPAP used?- [x]  No []  Yes     Does the patient monitor blood sugar?   []  N/A   [x]  No []  Yes  Patient has: []  NO Hx DM   []  Pre-DM   []  DM1  [x]   DM2 Last A1c was: 6.2  on 09-12-2023     Does patient have a [employer] or Dexcom? []  No []  Yes   Fasting Blood Sugar Ranges-  Checks Blood Sugar __0_ times a day Metformin - 500 mg bid,  hold DOS   Blood Thinner / Instructions:  Plavix - already on hold Aspirin  Instructions:  ASA 81 mg Hold 3-5 days   ERAS Protocol Ordered: [x]  No  []  Yes Patient is to be NPO after: MN Prior   Dental hx: [x]  Dentures: full set of dentures []  N/A      []  Bridge or Partial:                   []  Loose or Damaged teeth:    Activity level: Able to walk up 2 flights of stairs without becoming significantly short of breath or having chest pain?  []  No   [x]    Yes    Anesthesia review:CAD- LHC w/ BMS x 1 (2008), STEMI- LHC- DES x 1 overlap BMS (11-23-2019) HTN, Hx of craniectomy for bone fragment removal (1983), GERD, S/p eyelid reconstruction d/t skin cancer, ankylosing spondylitis, GERD, HOH-  doesn't wear HAs   Patient denies shortness of breath, fever, cough and chest pain at PAT appointment.   Patient verbalized understanding and agreement to the Pre-Surgical Instructions that were given to them at this PAT appointment. Patient was  also educated of the need to review these PAT instructions again prior to his surgery.I reviewed the appropriate phone numbers to call if they have any and questions or concerns.

## 2023-09-18 NOTE — Anesthesia Preprocedure Evaluation (Addendum)
 Anesthesia Evaluation  Patient identified by MRN, date of birth, ID band Patient awake    Reviewed: Allergy & Precautions, NPO status , Patient's Chart, lab work & pertinent test results, reviewed documented beta blocker date and time   Airway Mallampati: II  TM Distance: >3 FB Neck ROM: Limited    Dental  (+) Partial Lower, Partial Upper, Dental Advisory Given, Poor Dentition, Missing   Pulmonary asthma , pneumonia, resolved   Pulmonary exam normal breath sounds clear to auscultation       Cardiovascular hypertension, Pt. on medications and Pt. on home beta blockers + CAD, + Past MI and + Cardiac Stents  Normal cardiovascular exam Rhythm:Regular Rate:Normal  BMS mid LAD for unstable angina 01/08/07; anterior STEMI s/p DES mid LAD that overlapped old BMS 11/23/19  EKG 05/31/2023: Sinus tachycardia with 1st degree A-V block with Premature atrial complexes Left anterior fascicular block When compared with ECG of 07-Aug-2020 10:39, Premature atrial complexes are now Present Left anterior fascicular block is now Present Incomplete right bundle branch block is no longer Present  Echo 11/24/19  1. Left ventricular ejection fraction, by estimation, is 55 to 60%. The  left ventricle has normal function. The left ventricle demonstrates  regional wall motion abnormalities (see scoring diagram/findings for  description). Left ventricular diastolic  parameters are consistent with Grade I diastolic dysfunction (impaired  relaxation). There is akinesis of the left ventricular, apical septal wall  and inferior wall. There is akinesis of the left ventricular, apical  segment.   2. Right ventricular systolic function is normal. The right ventricular  size is normal.   3. The mitral valve is normal in structure. No evidence of mitral valve  regurgitation. No evidence of mitral stenosis.   4. The aortic valve is normal in structure. Aortic valve  regurgitation is  trivial. No aortic stenosis is present.   5. The inferior vena cava is normal in size with greater than 50%  respiratory variability, suggesting right atrial pressure of 3 mmHg.   Cardiac Cath 11/23/19 1.  Critical stenosis of the mid LAD and patient with anterior STEMI, treated successfully with PCI of the mid LAD using a drug-eluting stent (2.5 x 18 mm resolute Onyx) overlapped with the old bare-metal stent which remained patent. 2.  Severe first diagonal stenosis treated successfully with a 3.0 x 15 mm resolute Onyx DES 3.  Diffuse distal vessel coronary artery disease involving the LAD and PDA 4.  Mild nonobstructive left mainstem and left circumflex stenosis      Neuro/Psych  PSYCHIATRIC DISORDERS  Depression    Claustrophobia Neuromuscular disease    GI/Hepatic Neg liver ROS, PUD,GERD  Medicated,,UC- controlled with sulfasalazine +entyvio    Endo/Other  diabetes, Well Controlled, Type 2, Oral Hypoglycemic Agents  Hyperlipidemia  Renal/GU negative Renal ROS   Hx/o Prostate Ca S/P TURP    Musculoskeletal  (+) Arthritis , Osteoarthritis,  Fibromyalgia -Ankylosing spondylitis   Abdominal Normal abdominal exam  (+)   Peds  Hematology  (+) Blood dyscrasia, anemia   Anesthesia Other Findings   Reproductive/Obstetrics                              Anesthesia Physical Anesthesia Plan  ASA: 3  Anesthesia Plan: General   Post-op Pain Management: Ofirmev  IV (intra-op)*   Induction: Intravenous  PONV Risk Score and Plan: 3 and Ondansetron , Dexamethasone  and Midazolam   Airway Management Planned: Oral ETT and Video Laryngoscope Planned  Additional  Equipment: None  Intra-op Plan:   Post-operative Plan: Extubation in OR  Informed Consent: I have reviewed the patients History and Physical, chart, labs and discussed the procedure including the risks, benefits and alternatives for the proposed anesthesia with the patient or  authorized representative who has indicated his/her understanding and acceptance.     Dental advisory given  Plan Discussed with: Anesthesiologist and CRNA  Anesthesia Plan Comments: (See PAT note 09/17/2023  63 y.o. never smoker with h/o HTN, TMJ, GERD, asthma, ankylosing spondylitis, CAD s/p BMS to LAD October 2008 and PCI with DES overlapping previous BMS to mid LAD September 2021, prostate cancer scheduled for above procedure 09/26/2023 with Dr. Steffan Pea.    Per anesthesia note 06/06/2023, Difficulty Due To: Difficulty was anticipated and Difficult Airway- due to reduced neck mobility Glidescope used successfully.  )        Anesthesia Quick Evaluation

## 2023-09-18 NOTE — Progress Notes (Addendum)
 Anesthesia Chart Review   Case: [id number] Date/Time: 09/26/23 0915   Procedures:      PROSTATECTOMY, RADICAL, ROBOT-ASSISTED, LAPAROSCOPIC     LYMPHADENECTOMY, PELVIS, ROBOT-ASSISTED (Bilateral)   Anesthesia type: General   Diagnosis: Prostate cancer (HCC) [C61]   Pre-op diagnosis: PROSTATE CANCER   Location[facility] ROOM 03 / [facility]   Surgeons: Shane Steffan BROCKS, MD       DISCUSSION:63 y.o. never smoker with h/o HTN, TMJ, GERD, asthma, ankylosing spondylitis, CAD s/p BMS to LAD October 2008 and PCI with DES overlapping previous BMS to mid LAD September 2021, prostate cancer scheduled for above procedure 09/26/2023 with Dr. Steffan Shane.   Per anesthesia note 06/06/2023, Difficulty Due To: Difficulty was anticipated and Difficult Airway- due to reduced neck mobility Glidescope used successfully.   Per cardiology preoperative evaluation 09/04/2023, Chart reviewed as part of pre-operative protocol coverage. According to the RCRI, patient has a 0.9-6% risk of MACE. Patient reports activity equivalent to 4.0 METS (walking doing 1/2 mile a day).    Given past medical history and time since last visit, based on ACC/AHA guidelines, [name] would be at acceptable risk for the planned procedure without further cardiovascular testing.    Patient was advised that if he develops new symptoms prior to surgery to contact our office to arrange a follow-up appointment.  he verbalized understanding.   Per office visit note from Dr. Elmira in March 2025: If malignant diagnosis and need for repeat surgeries/procedures, recommend resuming Aspirin  81 mg, hold 3-5 days before repeat procedure. If no malignancy found, recommend resuming Plavix  75 mg daily. Regardless of malignancy or not, he would not need to be on dual antiplatelet therapy in the future.  Patient reports he stopped Plavix  secondary to increased hematuria. He then was taking aspirin  2 times a week but continued to have hematuria so  he stopped it altogether 2 weeks ago.    VS: BP 100/63 Comment: left arm sitting  Pulse 98   Temp 36.8 C (Oral)   Resp 16   Ht 5' 8 (1.727 m)   Wt 73 kg   SpO2 98%   BMI 24.48 kg/m   PROVIDERS: Gil Greig BRAVO, NP is PCP   Primary Cardiologist:  Newman JINNY Elmira, MD   LABS: Labs reviewed: Acceptable for surgery. (all labs ordered are listed, but only abnormal results are displayed)  Labs Reviewed  COMPREHENSIVE METABOLIC PANEL WITH GFR - Abnormal; Notable for the following components:      Result Value   Glucose, Bld 111 (*)    All other components within normal limits  GLUCOSE, CAPILLARY - Abnormal; Notable for the following components:   Glucose-Capillary 107 (*)    All other components within normal limits  CBC  TYPE AND SCREEN     IMAGES:   EKG:   CV: Echo 11/24/2019  1. Left ventricular ejection fraction, by estimation, is 55 to 60%. The  left ventricle has normal function. The left ventricle demonstrates  regional wall motion abnormalities (see scoring diagram/findings for  description). Left ventricular diastolic  parameters are consistent with Grade I diastolic dysfunction (impaired  relaxation). There is akinesis of the left ventricular, apical septal wall  and inferior wall. There is akinesis of the left ventricular, apical  segment.   2. Right ventricular systolic function is normal. The right ventricular  size is normal.   3. The mitral valve is normal in structure. No evidence of mitral valve  regurgitation. No evidence of mitral stenosis.  4. The aortic valve is normal in structure. Aortic valve regurgitation is  trivial. No aortic stenosis is present.   5. The inferior vena cava is normal in size with greater than 50%  respiratory variability, suggesting right atrial pressure of 3 mmHg.  Past Medical History:  Diagnosis Date   Anemia    Ankylosing spondylitis (HCC)    Arthritis    Asthma    SINCE AGE 63   Colitis, ulcerative (HCC)     Coronary artery disease    Bare metal LAD stent 10/08   Coronary atherosclerosis of native coronary artery 12/25/2012   Depression    Diabetes mellitus without complication [facility])    ED (erectile dysfunction)    Elevated prostate specific antigen (PSA) 12/25/2012   Esophageal reflux 12/25/2012   Extrinsic asthma, unspecified 12/25/2012   Fibromyalgia    GERD (gastroesophageal reflux disease)    History of heart attack    Hypercalcemia 12/25/2012   Hyperlipidemia    Hypertension    Left sided ulcerative (chronic) colitis (HCC) 12/25/2012   Mixed hyperlipidemia 12/25/2012   Myocardial infarction [facility])    Pneumonia    as a child   Prostate cancer (HCC) 06/06/2023   Pure hypercholesterolemia 12/25/2012   Skin cancer of eyelid    TMJ (dislocation of temporomandibular joint)     Past Surgical History:  Procedure Laterality Date   CATARACT EXTRACTION, BILATERAL     COLONOSCOPY     CORONARY/GRAFT ACUTE MI REVASCULARIZATION N/A 11/23/2019   Procedure: Coronary/Graft Acute MI Revascularization;  Surgeon: Wonda Sharper, MD;  Location: [facility] INVASIVE CV LAB;  Service: Cardiovascular;  Laterality: N/A;   EYE SURGERY Left    Inital lesion bx of eyelid   EYE SURGERY Left    Left eyelid, Moh's excision for skin cancer   LEFT HEART CATH AND CORONARY ANGIOGRAPHY N/A 11/23/2019   Procedure: LEFT HEART CATH AND CORONARY ANGIOGRAPHY;  Surgeon: Wonda Sharper, MD;  Location: [facility] INVASIVE CV LAB;  Service: Cardiovascular;  Laterality: N/A;   PROSTATE BIOPSY N/A 06/06/2023   Procedure: BIOPSY, PROSTATE, RECTAL APPROACH, WITH US  GUIDANCE;  Surgeon: Shane Steffan BROCKS, MD;  Location: [facility];  Service: Urology;  Laterality: N/A;   RADIOLOGY WITH ANESTHESIA N/A 08/27/2023   Procedure: MRI WITH ANESTHESIA;  Surgeon: Radiologist, Medication, MD;  Location: [facility] OR;  Service: Radiology;  Laterality: N/A;  MRI OF PROSTATE WITH AND WITHOUT CONTRAST   skull fracture after falling down stairs  1983   had  craniectomy to remove a piece of bone.   TONSILLECTOMY     TRANSURETHRAL RESECTION OF BLADDER TUMOR N/A 06/06/2023   Procedure: Removal of bladder stone   ,Right ureteroscopy diagnostic cytology obtained. , Bladder biopsy near left ureteral orifice , Transurethral resection of prostate removal of median lobe. ,  Prostate biopsy, Urethral dilation;  Surgeon: Shane Steffan BROCKS, MD;  Location: [facility];  Service: Urology;  Laterality: N/A;  BILATERAL RETROGRADE PYELOGRAM, POSSIBLE GEMCITABINE INSTILLATION    MEDICATIONS:  albuterol  (VENTOLIN  HFA) 108 (90 Base) MCG/ACT inhaler   cyclobenzaprine  (FLEXERIL ) 5 MG tablet   Ferrous Gluconate (IRON 27 PO)   gabapentin  (NEURONTIN ) 100 MG capsule   hyoscyamine  (ANASPAZ ) 0.125 MG TBDP disintergrating tablet   icosapent  Ethyl (VASCEPA ) 1 g capsule   lisinopril  (ZESTRIL ) 2.5 MG tablet   loratadine  (CLARITIN ) 10 MG tablet   metFORMIN  (GLUCOPHAGE -XR) 500 MG 24 hr tablet   metoprolol  succinate (TOPROL -XL) 50 MG 24 hr tablet   Multiple Vitamins-Minerals (CENTRUM CARDIO) TABS   nitroGLYCERIN  (NITROSTAT )  0.4 MG SL tablet   pantoprazole  (PROTONIX ) 40 MG tablet   REPATHA  SURECLICK 140 MG/ML SOAJ   sulfaSALAzine  (AZULFIDINE ) 500 MG EC tablet   tamsulosin  (FLOMAX ) 0.4 MG CAPS capsule   theophylline  (THEODUR) 300 MG 12 hr tablet   vedolizumab  (ENTYVIO ) 300 MG injection   No current facility-administered medications for this encounter.    Harlene Hoots Ward, PA-C [facility] Pre-Surgical Testing [phone]

## 2023-09-22 NOTE — H&P (Signed)
 63 year old male with history of elevated PSA. Biopsy in 2025 showed GG2 prostate cancer     PMH: GERD, HLD, TMJ, CAD, MI, on notroglyucerin and plavix  MI, 2021 and 2008. DM2.  PSH: no abdonminal surgery.   GH:  05/27/23: GH is getting worse, pain with urination, occasionally seems more frequent, pt is seeing some clots in the urine. Pt had GH 3 months. 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 PVR 331. Cysot today unable to see tumor poor visitbility. Offered to place catheter and irrigate pateint did not want to have it placed.  06/17/23: Bladder bx negative, patient having some pain in his testicles. Some low back pain., Prostate size 40.  07/08/23: needs CT   PCa:  05/27/23: no FH of PCa, no GYN cancer or breast cancer, no recent abx. Pt has never stopped Plavix . PSA in 2022 10.2 significantly increased WIll get PSA today. Strong FH hx of cancers  06/17/23: PCa L apex, mid medial and med lateral GG2 highest is 30%, SHIM 2 severe ED, AUASS 13, QOL 3. Prostate size 40.  07/08/23: here to discuss PCa  09/26/23: patient presents today for Radical prostatectomy        ALLERGIES: No Allergies    MEDICATIONS: Aspirin   Bactrim DS 800-160 MG Tablet 1 tablet PO As Directed Take 1 pill on morning of OR biopsy then 1 pill that night after biopsy  Hyoscyamine  Sulfate 0.125 MG Tablet Disintegrating 1 tablet PO Q 12 H PRN bladder spasm  Allergy  Centrum Cardio Oral Tablet Oral  Clopidogrel  Bisulfate 75 MG Tablet 1 tablet PO Daily  Fish Oil CAPS Oral  Iron  Prednisone   SulfaSALAzine  TABS Oral  Theophylline  ER TB24 Oral     GU PSH: Cystoscopy - 05/27/2023       PSH Notes: Tonsillectomy, Skull Repair, Cath Stent Placement, Eye Surgery for removal of a skin cancer with Mohs approach in 1/16 and 5/17   Prostate biopsy in 2012.   NON-GU PSH: Eye Surgery (Unspecified) Remove Tonsils - 2011 Visit Complexity (formerly GPC1X) - 06/19/2023, 06/17/2023, 06/12/2023     GU PMH: Orchitis - 06/19/2023 Gross  hematuria - 06/17/2023, - 06/12/2023, - 05/27/2023 Prostate Cancer - 06/17/2023, - 06/12/2023 Bladder Stone - 06/12/2023 BPH w/o LUTS - 06/12/2023, Benign prostatic hypertrophy without lower urinary tract symptoms, - 2014 Elevated PSA (Stable) - 05/27/2023, (Worsening), His PSA is up to 7.54 but it has been over 7 in the past. I am going to get a repeat PSA with a week of abstinence and if that is down or stable have him return in 6 months with a PSA., - 2019 (Worsening), His PSA is up to 5.92., - 2017 (Worsening), His PSA is up about 1.6 points above the level prior to his last prostate biopsy., - 2017, Elevated prostate specific antigen (PSA), - 2014      PMH Notes:  1898-03-19 00:00:00 - Note: Normal Routine History And Physical Adult  2010-01-09 11:49:25 - Note: Coronary Artery Disease  2010-01-09 11:22:23 - Note: Arthritis   NON-GU PMH: Ankylosing spondylitis of unspecified sites in spine, Ankylosing Spondylitis - 2014 Asthma, Asthma - 2014 Fibromyalgia, Fibromyalgia - 2014 Personal history of other diseases of the digestive system, History of ulcerative colitis - 2014, History of esophageal reflux, - 2014 Personal history of other endocrine, nutritional and metabolic disease, History of hypercholesterolemia - 2014 Arthritis Asthma Basal cell carcinoma of skin, unspecified GERD Ulcerative colitis, unspecified with unspecified complications    FAMILY HISTORY: Death In The Family Father - Runs  In Family Family Health Status Number - Runs In Family Lung Cancer - Father   SOCIAL HISTORY: Marital Status: Married Preferred Language: English; Ethnicity: Not Hispanic Or Latino; Race: White Current Smoking Status: Patient has never smoked.  Drinks 2 drinks per day.  Does not drink caffeine.     Notes: Never A Smoker, Occupation:, Marital History - Currently Married, Caffeine Use, Alcohol Use, Tobacco Use   REVIEW OF SYSTEMS:    GU Review Male:   Patient denies frequent urination, hard to  postpone urination, burning/ pain with urination, get up at night to urinate, leakage of urine, stream starts and stops, trouble starting your stream, have to strain to urinate , erection problems, and penile pain.  Gastrointestinal (Upper):   Patient denies nausea, vomiting, and indigestion/ heartburn.  Gastrointestinal (Lower):   Patient denies diarrhea and constipation.  Constitutional:   Patient denies fever, night sweats, weight loss, and fatigue.  Skin:   Patient denies skin rash/ lesion and itching.  Eyes:   Patient denies blurred vision and double vision.  Ears/ Nose/ Throat:   Patient denies sore throat and sinus problems.  Hematologic/Lymphatic:   Patient denies swollen glands and easy bruising.  Cardiovascular:   Patient denies leg swelling and chest pains.  Respiratory:   Patient denies cough and shortness of breath.  Endocrine:   Patient denies excessive thirst.  Musculoskeletal:   Patient denies back pain and joint pain.  Neurological:   Patient denies headaches and dizziness.  Psychologic:   Patient denies depression and anxiety.   VITAL SIGNS: None   MULTI-SYSTEM PHYSICAL EXAMINATION:    Constitutional: Well-nourished. No physical deformities. Normally developed. Good grooming.  Respiratory: No labored breathing, no use of accessory muscles.   Cardiovascular: Normal temperature, normal extremity pulses, no swelling, no varicosities.  Gastrointestinal: No scars or other abnormlaities to abdomen      Complexity of Data:  Source Of History:  Patient  Records Review:   Previous Patient Records  Urine Test Review:   Urinalysis   05/27/23 11/14/17 07/29/17 01/11/16 10/17/15 01/03/11 07/03/10 01/09/10  PSA  Total PSA 9.93 ng/mL 5.33 ng/mL 7.54 ng/dl 4.05  4.67  6.20  5.82  5.42   Free PSA  0.87 ng/mL  0.55  0.61   0.3  1.2   % Free PSA  16 % PSA 16.4 % 9  11   7  22      PROCEDURES:          Visit Complexity - G2211          Urinalysis w/Scope Dipstick Dipstick  Cont'd Micro  Color: Yellow Bilirubin: Neg mg/dL WBC/hpf: 0 - 5/hpf  Appearance: Slightly Cloudy Ketones: Neg mg/dL RBC/hpf: 3 - 89/yeq  Specific Gravity: 1.025 Blood: 3+ ery/uL Bacteria: Rare (0-9/hpf)  pH: 5.5 Protein: Trace mg/dL Cystals: NS (Not Seen)  Glucose: 2+ mg/dL Urobilinogen: 0.2 mg/dL Casts: NS (Not Seen)    Nitrites: Neg Trichomonas: Not Present    Leukocyte Esterase: 1+ leu/uL Mucous: Not Present      Epithelial Cells: NS (Not Seen)      Yeast: NS (Not Seen)      Sperm: Not Present    ASSESSMENT:      ICD-10 Details  1 GU:   Prostate Cancer - C61    PLAN:            Medications New Meds: Valium  10 MG Tablet 1 tablet PO As Directed Please take 1 hour prior to MRI  #1  0 Refill(s)  Pharmacy Name:  [id number]  Address:  [address]   [place], [place] [id number]  Phone:  [phone]  Fax:  [phone]    Refill Meds: Hyoscyamine  Sulfate 0.125 MG Tablet Disintegrating 1 tablet PO Q 12 H PRN bladder spasm  #14  0 Refill(s)            Orders Labs Urine Culture, BMP, CBC with Diff          Schedule X-Rays: 2 Weeks - C.T. Hematuria With and Without I.V. Contrast    2 Weeks - MRI Prostate GSORAD With and Without I.V. Contrast          Document Letter(s):  Created for Patient: Clinical Summary         Notes:   Prostate cancer: Favorable intermediate risk prostate cancer. He returns after discussion of radiation versus surgery after review of side effects for both radiation and surgery understanding that treatment is equivalent patient would prefer to pursue radical prostatectomy.   We discussed risk benefits alternatives to robotic assisted radical prostatectomy including bleeding infection damage chronic structures including the nerves as well as urinary incontinence. Explained the patient that since he already has severe ED unlikely to get erections after the procedure he was consented for blood and is agreeing to receive this if  necessary. We also discussed this the risk of needing catheter postop as well as a drain. Consent was obtained.   Metastatic workup negative.   Ucx shows 3 gram negative rods sent keflex prior to surgery.   Pt has stopped plavix .   Plan to proceed with robotic assisted laparoscopic prostatectomy today

## 2023-09-26 ENCOUNTER — Encounter (HOSPITAL_COMMUNITY): Payer: Self-pay | Admitting: Urology

## 2023-09-26 ENCOUNTER — Other Ambulatory Visit: Payer: Self-pay

## 2023-09-26 ENCOUNTER — Ambulatory Visit (HOSPITAL_COMMUNITY): Payer: Self-pay | Admitting: Physician Assistant

## 2023-09-26 ENCOUNTER — Encounter (HOSPITAL_COMMUNITY)
Admission: RE | Disposition: A | Discharge status: Home Health | Payer: Self-pay | Source: Home / Self Care | Attending: Urology

## 2023-09-26 ENCOUNTER — Ambulatory Visit (HOSPITAL_COMMUNITY)

## 2023-09-26 ENCOUNTER — Inpatient Hospital Stay (HOSPITAL_COMMUNITY)
Admission: RE | Admit: 2023-09-26 | Discharge: 2023-09-30 | DRG: 707 | Disposition: A | Discharge status: Home Health | Attending: Urology | Admitting: Urology

## 2023-09-26 DIAGNOSIS — Z79899 Other long term (current) drug therapy: Secondary | ICD-10-CM

## 2023-09-26 DIAGNOSIS — N390 Urinary tract infection, site not specified: Secondary | ICD-10-CM | POA: Diagnosis present

## 2023-09-26 DIAGNOSIS — Z7409 Other reduced mobility: Secondary | ICD-10-CM | POA: Diagnosis not present

## 2023-09-26 DIAGNOSIS — Z955 Presence of coronary angioplasty implant and graft: Secondary | ICD-10-CM

## 2023-09-26 DIAGNOSIS — J45909 Unspecified asthma, uncomplicated: Secondary | ICD-10-CM | POA: Diagnosis present

## 2023-09-26 DIAGNOSIS — G9781 Other intraoperative complications of nervous system: Secondary | ICD-10-CM | POA: Diagnosis not present

## 2023-09-26 DIAGNOSIS — K651 Peritoneal abscess: Secondary | ICD-10-CM | POA: Diagnosis not present

## 2023-09-26 DIAGNOSIS — I252 Old myocardial infarction: Secondary | ICD-10-CM | POA: Diagnosis not present

## 2023-09-26 DIAGNOSIS — Z7984 Long term (current) use of oral hypoglycemic drugs: Secondary | ICD-10-CM

## 2023-09-26 DIAGNOSIS — R000 Tachycardia, unspecified: Secondary | ICD-10-CM | POA: Diagnosis not present

## 2023-09-26 DIAGNOSIS — E118 Type 2 diabetes mellitus with unspecified complications: Secondary | ICD-10-CM

## 2023-09-26 DIAGNOSIS — Z8701 Personal history of pneumonia (recurrent): Secondary | ICD-10-CM

## 2023-09-26 DIAGNOSIS — M459 Ankylosing spondylitis of unspecified sites in spine: Secondary | ICD-10-CM | POA: Diagnosis present

## 2023-09-26 DIAGNOSIS — E119 Type 2 diabetes mellitus without complications: Secondary | ICD-10-CM | POA: Diagnosis present

## 2023-09-26 DIAGNOSIS — E7800 Pure hypercholesterolemia, unspecified: Secondary | ICD-10-CM | POA: Diagnosis present

## 2023-09-26 DIAGNOSIS — N3289 Other specified disorders of bladder: Secondary | ICD-10-CM | POA: Diagnosis not present

## 2023-09-26 DIAGNOSIS — Z85828 Personal history of other malignant neoplasm of skin: Secondary | ICD-10-CM | POA: Diagnosis not present

## 2023-09-26 DIAGNOSIS — I2510 Atherosclerotic heart disease of native coronary artery without angina pectoris: Secondary | ICD-10-CM | POA: Diagnosis not present

## 2023-09-26 DIAGNOSIS — E785 Hyperlipidemia, unspecified: Secondary | ICD-10-CM | POA: Diagnosis not present

## 2023-09-26 DIAGNOSIS — F32A Depression, unspecified: Secondary | ICD-10-CM | POA: Diagnosis present

## 2023-09-26 DIAGNOSIS — I10 Essential (primary) hypertension: Secondary | ICD-10-CM | POA: Diagnosis not present

## 2023-09-26 DIAGNOSIS — R109 Unspecified abdominal pain: Secondary | ICD-10-CM | POA: Diagnosis not present

## 2023-09-26 DIAGNOSIS — R319 Hematuria, unspecified: Secondary | ICD-10-CM | POA: Diagnosis not present

## 2023-09-26 DIAGNOSIS — K219 Gastro-esophageal reflux disease without esophagitis: Secondary | ICD-10-CM | POA: Diagnosis not present

## 2023-09-26 DIAGNOSIS — M545 Low back pain, unspecified: Secondary | ICD-10-CM | POA: Diagnosis not present

## 2023-09-26 DIAGNOSIS — F40240 Claustrophobia: Secondary | ICD-10-CM | POA: Diagnosis present

## 2023-09-26 DIAGNOSIS — M797 Fibromyalgia: Secondary | ICD-10-CM | POA: Diagnosis present

## 2023-09-26 DIAGNOSIS — Z01818 Encounter for other preprocedural examination: Principal | ICD-10-CM

## 2023-09-26 DIAGNOSIS — Y836 Removal of other organ (partial) (total) as the cause of abnormal reaction of the patient, or of later complication, without mention of misadventure at the time of the procedure: Secondary | ICD-10-CM | POA: Diagnosis not present

## 2023-09-26 DIAGNOSIS — Z801 Family history of malignant neoplasm of trachea, bronchus and lung: Secondary | ICD-10-CM

## 2023-09-26 DIAGNOSIS — C61 Malignant neoplasm of prostate: Principal | ICD-10-CM | POA: Diagnosis present

## 2023-09-26 DIAGNOSIS — J9811 Atelectasis: Secondary | ICD-10-CM | POA: Diagnosis not present

## 2023-09-26 HISTORY — PX: ROBOT ASSISTED LAPAROSCOPIC RADICAL PROSTATECTOMY: SHX5141

## 2023-09-26 LAB — BASIC METABOLIC PANEL WITH GFR
Anion gap: 12 (ref 5–15)
Anion gap: 12 (ref 5–15)
BUN: 15 mg/dL (ref 8–23)
BUN: 12 mg/dL (ref 8–23)
CO2: 21 mmol/L — ABNORMAL LOW (ref 22–32)
CO2: 19 mmol/L — ABNORMAL LOW (ref 22–32)
Calcium: 9.9 mg/dL (ref 8.9–10.3)
Calcium: 9.1 mg/dL (ref 8.9–10.3)
Chloride: 105 mmol/L (ref 98–111)
Chloride: 104 mmol/L (ref 98–111)
Creatinine, Ser: 0.81 mg/dL (ref 0.61–1.24)
Creatinine, Ser: 0.81 mg/dL (ref 0.61–1.24)
GFR, Estimated: >60 mL/min (ref >60)
GFR, Estimated: >60 mL/min (ref >60)
Glucose, Bld: 155 mg/dL — ABNORMAL HIGH (ref 70–99)
Glucose, Bld: 194 mg/dL — ABNORMAL HIGH (ref 70–99)
Potassium: 3.6 mmol/L (ref 3.5–5.1)
Potassium: 4.1 mmol/L (ref 3.5–5.1)
Sodium: 138 mmol/L (ref 135–145)
Sodium: 135 mmol/L (ref 135–145)

## 2023-09-26 LAB — GLUCOSE, CAPILLARY
Glucose-Capillary: 137 mg/dL — ABNORMAL HIGH (ref 70–99)
Glucose-Capillary: 172 mg/dL — ABNORMAL HIGH (ref 70–99)
Glucose-Capillary: 164 mg/dL — ABNORMAL HIGH (ref 70–99)
Glucose-Capillary: 153 mg/dL — ABNORMAL HIGH (ref 70–99)

## 2023-09-26 LAB — CBC
HCT: 40.7 % (ref 39.0–52.0)
Hemoglobin: 13 g/dL (ref 13.0–17.0)
MCH: 29.2 pg (ref 26.0–34.0)
MCHC: 31.9 g/dL (ref 30.0–36.0)
MCV: 91.5 fL (ref 80.0–100.0)
Platelets: 360 K/uL (ref 150–400)
RBC: 4.45 MIL/uL (ref 4.22–5.81)
RDW: 13.7 % (ref 11.5–15.5)
WBC: 20 K/uL — ABNORMAL HIGH (ref 4.0–10.5)
nRBC: 0 % (ref 0.0–0.2)

## 2023-09-26 LAB — ABO/RH: ABO/RH(D): A NEG

## 2023-09-26 LAB — TYPE AND SCREEN
ABO/RH(D): A NEG
Antibody Screen: NEGATIVE

## 2023-09-26 SURGERY — PROSTATECTOMY, RADICAL, ROBOT-ASSISTED, LAPAROSCOPIC
Anesthesia: General

## 2023-09-26 MED ORDER — ONDANSETRON HCL 4 MG/2ML IJ SOLN
INTRAMUSCULAR | Status: AC
Start: 2023-09-26 — End: 2023-09-26
  Filled 2023-09-26: qty 2

## 2023-09-26 MED ORDER — ORAL CARE MOUTH RINSE: 15.0000 mL | Freq: Once | OROMUCOSAL | Status: AC

## 2023-09-26 MED ORDER — ROCURONIUM BROMIDE 10 MG/ML (PF) SYRINGE
PREFILLED_SYRINGE | INTRAVENOUS | Status: AC
  Filled 2023-09-26: qty 10

## 2023-09-26 MED ORDER — FENTANYL CITRATE (PF) 100 MCG/2ML IJ SOLN
INTRAMUSCULAR | Status: DC | PRN
  Administered 2023-09-26 (×4): 50 ug via INTRAVENOUS

## 2023-09-26 MED ORDER — BUPIVACAINE HCL (PF) 0.25 % IJ SOLN
INTRAMUSCULAR | Status: AC
  Filled 2023-09-26: qty 30

## 2023-09-26 MED ORDER — HYDROMORPHONE HCL 1 MG/ML IJ SOLN
0.5000 mg | INTRAMUSCULAR | Status: DC | PRN
  Administered 2023-09-26 – 2023-09-28 (×6): 1 mg via INTRAVENOUS
  Administered 2023-09-28: 0.5 mg via INTRAVENOUS
  Filled 2023-09-26 (×7): qty 1

## 2023-09-26 MED ORDER — ALBUTEROL SULFATE (2.5 MG/3ML) 0.083% IN NEBU
2.5000 mg | INHALATION_SOLUTION | RESPIRATORY_TRACT | Status: DC | PRN
  Administered 2023-09-27 – 2023-09-28 (×2): 2.5 mg via RESPIRATORY_TRACT
  Filled 2023-09-26 (×2): qty 3

## 2023-09-26 MED ORDER — INSULIN ASPART 100 UNIT/ML IJ SOLN: 0.0000 [IU] | INTRAMUSCULAR | Status: DC | PRN

## 2023-09-26 MED ORDER — PANTOPRAZOLE SODIUM 40 MG PO TBEC
40.0000 mg | DELAYED_RELEASE_TABLET | Freq: Every day | ORAL | Status: DC
  Administered 2023-09-27 – 2023-09-30 (×4): 40 mg via ORAL
  Filled 2023-09-26 (×4): qty 1

## 2023-09-26 MED ORDER — LACTATED RINGERS IV SOLN: INTRAVENOUS | Status: DC

## 2023-09-26 MED ORDER — SODIUM CHLORIDE (PF) 0.9 % IJ SOLN
INTRAMUSCULAR | Status: AC
  Filled 2023-09-26: qty 10

## 2023-09-26 MED ORDER — MIDAZOLAM HCL 5 MG/5ML IJ SOLN
INTRAMUSCULAR | Status: DC | PRN
  Administered 2023-09-26: 2 mg via INTRAVENOUS

## 2023-09-26 MED ORDER — PHENYLEPHRINE HCL-NACL 20-0.9 MG/250ML-% IV SOLN
INTRAVENOUS | Status: DC | PRN
  Administered 2023-09-26: 20 ug/min via INTRAVENOUS
  Administered 2023-09-26: 10 ug/min via INTRAVENOUS

## 2023-09-26 MED ORDER — BUPIVACAINE HCL (PF) 0.25 % IJ SOLN
INTRAMUSCULAR | Status: DC | PRN
  Administered 2023-09-26: 30 mL

## 2023-09-26 MED ORDER — CEFAZOLIN SODIUM-DEXTROSE 2-4 GM/100ML-% IV SOLN
2.0000 g | INTRAVENOUS | Status: AC
  Administered 2023-09-26 (×2): 2 g via INTRAVENOUS
  Filled 2023-09-26: qty 100

## 2023-09-26 MED ORDER — CEFAZOLIN SODIUM-DEXTROSE 1-4 GM/50ML-% IV SOLN
1.0000 g | Freq: Three times a day (TID) | INTRAVENOUS | Status: AC
  Administered 2023-09-26 – 2023-09-27 (×4): 1 g via INTRAVENOUS
  Filled 2023-09-26 (×4): qty 50

## 2023-09-26 MED ORDER — ONDANSETRON HCL 4 MG/2ML IJ SOLN: 4.0000 mg | INTRAMUSCULAR | Status: DC | PRN

## 2023-09-26 MED ORDER — HEPARIN SODIUM (PORCINE) 5000 UNIT/ML IJ SOLN: 5000.0000 [IU] | Freq: Once | INTRAMUSCULAR | Status: DC

## 2023-09-26 MED ORDER — STERILE WATER FOR IRRIGATION IR SOLN
Status: DC | PRN
  Administered 2023-09-26: 1000 mL

## 2023-09-26 MED ORDER — ACETAMINOPHEN 10 MG/ML IV SOLN: 1000.0000 mg | Freq: Once | INTRAVENOUS | Status: DC | PRN

## 2023-09-26 MED ORDER — HYOSCYAMINE SULFATE 0.125 MG PO TBDP
0.1250 mg | ORAL_TABLET | Freq: Four times a day (QID) | ORAL | Status: DC | PRN
  Administered 2023-09-27 – 2023-09-28 (×3): 0.125 mg via SUBLINGUAL
  Filled 2023-09-26 (×5): qty 1

## 2023-09-26 MED ORDER — DEXAMETHASONE SODIUM PHOSPHATE 10 MG/ML IJ SOLN
INTRAMUSCULAR | Status: AC
  Filled 2023-09-26: qty 1

## 2023-09-26 MED ORDER — LIDOCAINE HCL (PF) 2 % IJ SOLN
INTRAMUSCULAR | Status: AC
  Filled 2023-09-26: qty 5

## 2023-09-26 MED ORDER — ACETAMINOPHEN 500 MG PO TABS
1000.0000 mg | ORAL_TABLET | Freq: Four times a day (QID) | ORAL | Status: AC
  Administered 2023-09-26 – 2023-09-27 (×4): 1000 mg via ORAL
  Filled 2023-09-26 (×4): qty 2

## 2023-09-26 MED ORDER — SODIUM CHLORIDE 0.9 % IV SOLN: INTRAVENOUS | Status: DC

## 2023-09-26 MED ORDER — DEXMEDETOMIDINE HCL IN NACL 80 MCG/20ML IV SOLN
INTRAVENOUS | Status: AC
Start: 2023-09-26 — End: 2023-09-26
  Filled 2023-09-26: qty 20

## 2023-09-26 MED ORDER — ONDANSETRON HCL 4 MG/2ML IJ SOLN
INTRAMUSCULAR | Status: DC | PRN
  Administered 2023-09-26: 4 mg via INTRAVENOUS

## 2023-09-26 MED ORDER — INSULIN ASPART 100 UNIT/ML IJ SOLN
2.0000 [IU] | Freq: Three times a day (TID) | INTRAMUSCULAR | Status: DC
  Administered 2023-09-27 – 2023-09-29 (×9): 2 [IU] via SUBCUTANEOUS

## 2023-09-26 MED ORDER — NITROGLYCERIN 0.4 MG SL SUBL: 0.4000 mg | SUBLINGUAL_TABLET | SUBLINGUAL | Status: DC | PRN

## 2023-09-26 MED ORDER — ROCURONIUM BROMIDE 100 MG/10ML IV SOLN
INTRAVENOUS | Status: DC | PRN
  Administered 2023-09-26 (×2): 20 mg via INTRAVENOUS
  Administered 2023-09-26: 10 mg via INTRAVENOUS
  Administered 2023-09-26: 60 mg via INTRAVENOUS
  Administered 2023-09-26 (×3): 20 mg via INTRAVENOUS

## 2023-09-26 MED ORDER — ALBUMIN HUMAN 5 % IV SOLN: INTRAVENOUS | Status: DC | PRN

## 2023-09-26 MED ORDER — HYDROMORPHONE HCL 2 MG/ML IJ SOLN
INTRAMUSCULAR | Status: AC
  Filled 2023-09-26: qty 1

## 2023-09-26 MED ORDER — OXYCODONE HCL 5 MG/5ML PO SOLN: 5.0000 mg | Freq: Once | ORAL | Status: DC | PRN

## 2023-09-26 MED ORDER — PROPOFOL 1000 MG/100ML IV EMUL
INTRAVENOUS | Status: AC
  Filled 2023-09-26: qty 100

## 2023-09-26 MED ORDER — KETAMINE HCL 10 MG/ML IJ SOLN
INTRAMUSCULAR | Status: DC | PRN
  Administered 2023-09-26: 10 mg via INTRAVENOUS
  Administered 2023-09-26: 30 mg via INTRAVENOUS

## 2023-09-26 MED ORDER — CEFAZOLIN SODIUM-DEXTROSE 2-4 GM/100ML-% IV SOLN
INTRAVENOUS | Status: AC
  Filled 2023-09-26: qty 100

## 2023-09-26 MED ORDER — HYDROMORPHONE HCL 1 MG/ML IJ SOLN
INTRAMUSCULAR | Status: DC | PRN
  Administered 2023-09-26: .2 mg via INTRAVENOUS
  Administered 2023-09-26: .4 mg via INTRAVENOUS
  Administered 2023-09-26: .6 mg via INTRAVENOUS
  Administered 2023-09-26 (×2): .4 mg via INTRAVENOUS

## 2023-09-26 MED ORDER — PROPOFOL 500 MG/50ML IV EMUL
INTRAVENOUS | Status: DC | PRN
  Administered 2023-09-26: 85 ug/kg/min via INTRAVENOUS

## 2023-09-26 MED ORDER — BUPIVACAINE LIPOSOME 1.3 % IJ SUSP
INTRAMUSCULAR | Status: DC | PRN
  Administered 2023-09-26: 20 mL

## 2023-09-26 MED ORDER — LIDOCAINE HCL (PF) 2 % IJ SOLN
INTRAMUSCULAR | Status: DC | PRN
  Administered 2023-09-26: 100 mg via INTRADERMAL

## 2023-09-26 MED ORDER — INSULIN ASPART 100 UNIT/ML IJ SOLN: 0.0000 [IU] | Freq: Every day | INTRAMUSCULAR | Status: DC

## 2023-09-26 MED ORDER — PROPOFOL 10 MG/ML IV BOLUS
INTRAVENOUS | Status: AC
  Filled 2023-09-26: qty 20

## 2023-09-26 MED ORDER — SODIUM CHLORIDE (PF) 0.9 % IJ SOLN
INTRAMUSCULAR | Status: AC
  Filled 2023-09-26: qty 20

## 2023-09-26 MED ORDER — SULFASALAZINE 500 MG PO TBEC
1000.0000 mg | DELAYED_RELEASE_TABLET | Freq: Two times a day (BID) | ORAL | Status: DC
  Administered 2023-09-26 – 2023-09-30 (×8): 1000 mg via ORAL
  Filled 2023-09-26 (×9): qty 2

## 2023-09-26 MED ORDER — CHLORHEXIDINE GLUCONATE 0.12 % MT SOLN
15.0000 mL | Freq: Once | OROMUCOSAL | Status: AC
  Administered 2023-09-26: 15 mL via OROMUCOSAL

## 2023-09-26 MED ORDER — FENTANYL CITRATE PF 50 MCG/ML IJ SOSY: 25.0000 ug | PREFILLED_SYRINGE | INTRAMUSCULAR | Status: DC | PRN

## 2023-09-26 MED ORDER — SENNOSIDES-DOCUSATE SODIUM 8.6-50 MG PO TABS
2.0000 | ORAL_TABLET | Freq: Every day | ORAL | Status: DC
  Administered 2023-09-26: 2 via ORAL
  Filled 2023-09-26: qty 2

## 2023-09-26 MED ORDER — THEOPHYLLINE ER 300 MG PO TB12
300.0000 mg | ORAL_TABLET | Freq: Two times a day (BID) | ORAL | Status: DC
  Administered 2023-09-26 – 2023-09-30 (×8): 300 mg via ORAL
  Filled 2023-09-26 (×9): qty 1

## 2023-09-26 MED ORDER — LACTATED RINGERS IV SOLN: INTRAVENOUS | Status: DC | PRN

## 2023-09-26 MED ORDER — ALBUMIN HUMAN 5 % IV SOLN
INTRAVENOUS | Status: AC
  Filled 2023-09-26: qty 250

## 2023-09-26 MED ORDER — FLEET ENEMA RE ENEM: 1.0000 | ENEMA | Freq: Once | RECTAL | Status: DC

## 2023-09-26 MED ORDER — INSULIN ASPART 100 UNIT/ML IJ SOLN
0.0000 [IU] | Freq: Three times a day (TID) | INTRAMUSCULAR | Status: DC
  Administered 2023-09-26: 2 [IU] via SUBCUTANEOUS
  Administered 2023-09-27 (×2): 1 [IU] via SUBCUTANEOUS
  Administered 2023-09-27 – 2023-09-29 (×5): 2 [IU] via SUBCUTANEOUS
  Administered 2023-09-29 (×2): 1 [IU] via SUBCUTANEOUS
  Administered 2023-09-30: 2 [IU] via SUBCUTANEOUS
  Administered 2023-09-30: 1 [IU] via SUBCUTANEOUS

## 2023-09-26 MED ORDER — DIPHENHYDRAMINE HCL 50 MG/ML IJ SOLN: 12.5000 mg | Freq: Four times a day (QID) | INTRAMUSCULAR | Status: DC | PRN

## 2023-09-26 MED ORDER — CHLORHEXIDINE GLUCONATE CLOTH 2 % EX PADS
6.0000 | MEDICATED_PAD | Freq: Every day | CUTANEOUS | Status: DC
  Administered 2023-09-26 – 2023-09-29 (×4): 6 via TOPICAL

## 2023-09-26 MED ORDER — DIPHENHYDRAMINE HCL 12.5 MG/5ML PO ELIX: 12.5000 mg | ORAL_SOLUTION | Freq: Four times a day (QID) | ORAL | Status: DC | PRN

## 2023-09-26 MED ORDER — METOPROLOL SUCCINATE ER 50 MG PO TB24
50.0000 mg | ORAL_TABLET | Freq: Every day | ORAL | Status: DC
  Administered 2023-09-27 – 2023-09-30 (×4): 50 mg via ORAL
  Filled 2023-09-26 (×4): qty 1

## 2023-09-26 MED ORDER — PROPOFOL 10 MG/ML IV BOLUS
INTRAVENOUS | Status: DC | PRN
  Administered 2023-09-26: 120 mg via INTRAVENOUS

## 2023-09-26 MED ORDER — FENTANYL CITRATE (PF) 100 MCG/2ML IJ SOLN
INTRAMUSCULAR | Status: AC
Start: 2023-09-26 — End: 2023-09-26
  Filled 2023-09-26: qty 2

## 2023-09-26 MED ORDER — PHENYLEPHRINE 80 MCG/ML (10ML) SYRINGE FOR IV PUSH (FOR BLOOD PRESSURE SUPPORT)
PREFILLED_SYRINGE | INTRAVENOUS | Status: DC | PRN
  Administered 2023-09-26: 160 ug via INTRAVENOUS

## 2023-09-26 MED ORDER — DOCUSATE SODIUM 100 MG PO CAPS
100.0000 mg | ORAL_CAPSULE | Freq: Two times a day (BID) | ORAL | Status: DC
  Administered 2023-09-26 – 2023-09-30 (×8): 100 mg via ORAL
  Filled 2023-09-26 (×8): qty 1

## 2023-09-26 MED ORDER — HEPARIN SODIUM (PORCINE) 5000 UNIT/ML IJ SOLN
5000.0000 [IU] | Freq: Once | INTRAMUSCULAR | Status: AC
  Administered 2023-09-26: 5000 [IU] via SUBCUTANEOUS

## 2023-09-26 MED ORDER — OXYCODONE HCL 5 MG PO TABS: 5.0000 mg | ORAL_TABLET | Freq: Once | ORAL | Status: DC | PRN

## 2023-09-26 MED ORDER — SUGAMMADEX SODIUM 200 MG/2ML IV SOLN
INTRAVENOUS | Status: DC | PRN
Start: 2023-09-26 — End: 2023-09-26
  Administered 2023-09-26: 200 mg via INTRAVENOUS

## 2023-09-26 MED ORDER — ALBUTEROL SULFATE HFA 108 (90 BASE) MCG/ACT IN AERS: 2.0000 | INHALATION_SPRAY | RESPIRATORY_TRACT | Status: DC | PRN

## 2023-09-26 MED ORDER — BUPIVACAINE LIPOSOME 1.3 % IJ SUSP
INTRAMUSCULAR | Status: AC
  Filled 2023-09-26: qty 20

## 2023-09-26 MED ORDER — HEMOSTATIC AGENTS (NO CHARGE) OPTIME
TOPICAL | Status: DC | PRN
  Administered 2023-09-26: 1 via TOPICAL

## 2023-09-26 MED ORDER — ICOSAPENT ETHYL 1 G PO CAPS
2.0000 g | ORAL_CAPSULE | Freq: Two times a day (BID) | ORAL | Status: DC
Start: 2023-09-26 — End: 2023-10-01
  Administered 2023-09-26 – 2023-09-30 (×8): 2 g via ORAL
  Filled 2023-09-26 (×9): qty 2

## 2023-09-26 MED ORDER — DEXMEDETOMIDINE HCL IN NACL 80 MCG/20ML IV SOLN
INTRAVENOUS | Status: DC | PRN
  Administered 2023-09-26 (×4): 4 ug via INTRAVENOUS
  Administered 2023-09-26: 8 ug via INTRAVENOUS
  Administered 2023-09-26 (×2): 4 ug via INTRAVENOUS

## 2023-09-26 MED ORDER — GABAPENTIN 100 MG PO CAPS
200.0000 mg | ORAL_CAPSULE | Freq: Every day | ORAL | Status: DC
  Administered 2023-09-26 – 2023-09-29 (×4): 200 mg via ORAL
  Filled 2023-09-26 (×4): qty 2

## 2023-09-26 MED ORDER — DROPERIDOL 2.5 MG/ML IJ SOLN: 0.6250 mg | Freq: Once | INTRAMUSCULAR | Status: DC | PRN

## 2023-09-26 MED ORDER — LORATADINE 10 MG PO TABS
10.0000 mg | ORAL_TABLET | Freq: Every day | ORAL | Status: DC
  Administered 2023-09-28 – 2023-09-30 (×3): 10 mg via ORAL
  Filled 2023-09-26 (×4): qty 1

## 2023-09-26 MED ORDER — LACTATED RINGERS IR SOLN
Status: DC | PRN
  Administered 2023-09-26: 1000 mL

## 2023-09-26 MED ORDER — DEXAMETHASONE SODIUM PHOSPHATE 10 MG/ML IJ SOLN
INTRAMUSCULAR | Status: DC | PRN
  Administered 2023-09-26: 5 mg via INTRAVENOUS

## 2023-09-26 MED ORDER — PHENYLEPHRINE 80 MCG/ML (10ML) SYRINGE FOR IV PUSH (FOR BLOOD PRESSURE SUPPORT)
PREFILLED_SYRINGE | INTRAVENOUS | Status: AC
  Filled 2023-09-26: qty 10

## 2023-09-26 MED ORDER — FENTANYL CITRATE (PF) 100 MCG/2ML IJ SOLN
INTRAMUSCULAR | Status: AC
  Filled 2023-09-26: qty 2

## 2023-09-26 MED ORDER — OXYCODONE HCL 5 MG PO TABS
5.0000 mg | ORAL_TABLET | Freq: Four times a day (QID) | ORAL | Status: DC | PRN
  Administered 2023-09-27 – 2023-09-30 (×12): 5 mg via ORAL
  Filled 2023-09-26 (×13): qty 1

## 2023-09-26 MED ORDER — HEPARIN SODIUM (PORCINE) 5000 UNIT/ML IJ SOLN
5000.0000 [IU] | Freq: Once | INTRAMUSCULAR | Status: DC
  Filled 2023-09-26: qty 1

## 2023-09-26 MED ORDER — KETAMINE HCL 50 MG/5ML IJ SOSY
PREFILLED_SYRINGE | INTRAMUSCULAR | Status: AC
  Filled 2023-09-26: qty 5

## 2023-09-26 MED ORDER — POLYETHYLENE GLYCOL 3350 17 G PO PACK
17.0000 g | PACK | Freq: Every day | ORAL | Status: DC
  Administered 2023-09-27: 17 g via ORAL
  Filled 2023-09-26: qty 1

## 2023-09-26 MED ORDER — MAGNESIUM CITRATE PO SOLN: 1.0000 | Freq: Once | ORAL | Status: DC

## 2023-09-26 MED ORDER — MIDAZOLAM HCL 2 MG/2ML IJ SOLN
INTRAMUSCULAR | Status: AC
  Filled 2023-09-26: qty 2

## 2023-09-26 SURGICAL SUPPLY — 61 items
APPLICATOR COTTON TIP 6 STRL (MISCELLANEOUS) ×3 IMPLANT
APPLICATOR SURGIFLO ENDO (HEMOSTASIS) ×3 IMPLANT
BAG COUNTER SPONGE SURGICOUNT (BAG) IMPLANT
CATH FOLEY 2WAY SLVR 5CC 18FR (CATHETERS) ×3 IMPLANT
CATH ROBINSON RED A/P 16FR (CATHETERS) ×3 IMPLANT
CATH TIEMANN FOLEY 18FR 5CC (CATHETERS) ×3 IMPLANT
CHLORAPREP W/TINT 26 (MISCELLANEOUS) ×3 IMPLANT
CLIP LIGATING HEM O LOK PURPLE (MISCELLANEOUS) ×6 IMPLANT
COVER SURGICAL LIGHT HANDLE (MISCELLANEOUS) ×3 IMPLANT
COVER TIP SHEARS 8 DVNC (MISCELLANEOUS) ×3 IMPLANT
CUTTER ECHEON FLEX ENDO 45 340 (ENDOMECHANICALS) ×3 IMPLANT
DERMABOND ADVANCED .7 DNX12 (GAUZE/BANDAGES/DRESSINGS) ×6 IMPLANT
DRAPE ARM DVNC X/XI (DISPOSABLE) ×12 IMPLANT
DRAPE COLUMN DVNC XI (DISPOSABLE) ×3 IMPLANT
DRAPE SURG IRRIG POUCH 19X23 (DRAPES) ×3 IMPLANT
DRIVER NDL LRG 8 DVNC XI (INSTRUMENTS) ×6 IMPLANT
DRIVER NDLE LRG 8 DVNC XI (INSTRUMENTS) ×4 IMPLANT
DRSG TEGADERM 4X4.75 (GAUZE/BANDAGES/DRESSINGS) ×3 IMPLANT
ELECT PENCIL ROCKER SW 15FT (MISCELLANEOUS) ×3 IMPLANT
ELECT REM PT RETURN 15FT ADLT (MISCELLANEOUS) ×3 IMPLANT
FORCEPS BPLR LNG DVNC XI (INSTRUMENTS) ×3 IMPLANT
FORCEPS PROGRASP DVNC XI (FORCEP) ×3 IMPLANT
GAUZE 4X4 16PLY ~~LOC~~+RFID DBL (SPONGE) IMPLANT
GAUZE SPONGE 2X2 8PLY STRL LF (GAUZE/BANDAGES/DRESSINGS) IMPLANT
GAUZE SPONGE 4X4 12PLY STRL (GAUZE/BANDAGES/DRESSINGS) ×3 IMPLANT
GLOVE BIO SURGEON STRL SZ 6.5 (GLOVE) ×3 IMPLANT
GLOVE BIO SURGEON STRL SZ8 (GLOVE) ×6 IMPLANT
GLOVE BIOGEL PI IND STRL 8.5 (GLOVE) ×6 IMPLANT
GOWN STRL REUS W/ TWL XL LVL3 (GOWN DISPOSABLE) ×6 IMPLANT
GOWN STRL SURGICAL XL XLNG (GOWN DISPOSABLE) ×3 IMPLANT
HEMOSTAT SURGICEL 2X4 FIBR (HEMOSTASIS) ×3 IMPLANT
HOLDER FOLEY CATH W/STRAP (MISCELLANEOUS) ×3 IMPLANT
IRRIGATION SUCT STRKRFLW 2 WTP (MISCELLANEOUS) ×3 IMPLANT
IV LACTATED RINGERS 1000ML (IV SOLUTION) IMPLANT
KIT TURNOVER KIT A (KITS) ×3 IMPLANT
MARKER SKIN DUAL TIP RULER LAB (MISCELLANEOUS) IMPLANT
NDL INSUFFLATION 14GA 120MM (NEEDLE) ×3 IMPLANT
NEEDLE INSUFFLATION 14GA 120MM (NEEDLE) ×2 IMPLANT
PACK ROBOT UROLOGY CUSTOM (CUSTOM PROCEDURE TRAY) ×3 IMPLANT
PAD POSITIONING PINK XL (MISCELLANEOUS) ×3 IMPLANT
PLUG CATH AND CAP STRL 200 (CATHETERS) IMPLANT
PORT ACCESS TROCAR AIRSEAL 12 (TROCAR) ×3 IMPLANT
RELOAD STAPLE 45 4.1 GRN THCK (STAPLE) ×3 IMPLANT
SCISSORS MNPLR CVD DVNC XI (INSTRUMENTS) ×3 IMPLANT
SEAL UNIV 5-12 XI (MISCELLANEOUS) ×12 IMPLANT
SET TRI-LUMEN FLTR TB AIRSEAL (TUBING) ×3 IMPLANT
SOL PREP POV-IOD 4OZ 10% (MISCELLANEOUS) ×3 IMPLANT
SOLUTION ELECTROSURG ANTI STCK (MISCELLANEOUS) ×3 IMPLANT
SPIKE FLUID TRANSFER (MISCELLANEOUS) ×3 IMPLANT
STAPLE RELOAD 45 GRN (STAPLE) ×2 IMPLANT
SURGIFLO W/THROMBIN 8M KIT (HEMOSTASIS) ×3 IMPLANT
SUT ETHILON 3 0 PS 1 (SUTURE) ×3 IMPLANT
SUT MNCRL AB 4-0 PS2 18 (SUTURE) ×6 IMPLANT
SUT PDS AB 0 CT1 36 (SUTURE) ×6 IMPLANT
SUT VIC AB 0 CT1 27XBRD ANTBC (SUTURE) ×3 IMPLANT
SUT VIC AB 2-0 SH 27X BRD (SUTURE) ×6 IMPLANT
SUT VIC AB 3-0 SH 27XBRD (SUTURE) IMPLANT
SUT VICRYL 0 UR6 27IN ABS (SUTURE) IMPLANT
SUT VLOC 3-0 9IN GRN (SUTURE) ×3 IMPLANT
SUTURE VLOC BRB 180 ABS3/0GR12 (SUTURE) ×6 IMPLANT
WATER STERILE IRR 1000ML POUR (IV SOLUTION) ×3 IMPLANT

## 2023-09-26 NOTE — Plan of Care (Signed)
  Problem: Pain Management: Goal: General experience of comfort will improve Outcome: Progressing   Problem: Clinical Measurements: Goal: Will remain free from infection Outcome: Progressing   Problem: Clinical Measurements: Goal: Diagnostic test results will improve Outcome: Progressing   Problem: Clinical Measurements: Goal: Respiratory complications will improve Outcome: Progressing   Problem: Clinical Measurements: Goal: Cardiovascular complication will be avoided Outcome: Progressing

## 2023-09-26 NOTE — Anesthesia Procedure Notes (Signed)
 Procedure Name: Intubation Date/Time: 09/26/2023 10:15 AM  Performed by: Landy Chip HERO, CRNAPre-anesthesia Checklist: Patient identified, Emergency Drugs available, Suction available and Patient being monitored Patient Re-evaluated:Patient Re-evaluated prior to induction Oxygen Delivery Method: Circle System Utilized Preoxygenation: Pre-oxygenation with 100% oxygen Induction Type: IV induction Ventilation: Mask ventilation without difficulty Laryngoscope Size: Glidescope and 4 Grade View: Grade II Tube type: Oral Tube size: 7.5 mm Number of attempts: 1 Airway Equipment and Method: Rigid stylet Placement Confirmation: ETT inserted through vocal cords under direct vision, positive ETCO2 and breath sounds checked- equal and bilateral Secured at: 23 cm Tube secured with: Tape Dental Injury: Teeth and Oropharynx as per pre-operative assessment

## 2023-09-26 NOTE — Transfer of Care (Signed)
 Immediate Anesthesia Transfer of Care Note  Patient: [name]  Procedure(s) Performed: PROSTATECTOMY, RADICAL, ROBOT-ASSISTED, LAPAROSCOPIC LYMPHADENECTOMY, PELVIS, ROBOT-ASSISTED (Bilateral)  Patient Location: PACU  Anesthesia Type:General  Level of Consciousness: drowsy  Airway & Oxygen Therapy: Patient Spontanous Breathing and Patient connected to face mask oxygen  Post-op Assessment: Report given to RN and Post -op Vital signs reviewed and stable  Post vital signs: Reviewed and stable  Last Vitals:  Vitals Value Taken Time  BP 144/87 09/26/23 15:30  Temp    Pulse 88 09/26/23 15:30  Resp 12 09/26/23 15:32  SpO2 100 % 09/26/23 15:30  Vitals shown include unfiled device data.  Last Pain:  Vitals:   09/26/23 0741  TempSrc:   PainSc: 0-No pain      Patients Stated Pain Goal: 5 (09/26/23 0741)  Complications: No notable events documented.

## 2023-09-26 NOTE — Anesthesia Postprocedure Evaluation (Signed)
 Anesthesia Post Note  Patient: [name]  Procedure(s) Performed: PROSTATECTOMY, RADICAL, ROBOT-ASSISTED, LAPAROSCOPIC LYMPHADENECTOMY, PELVIS, ROBOT-ASSISTED (Bilateral)     Patient location during evaluation: PACU Anesthesia Type: General Level of consciousness: awake and alert Pain management: pain level controlled Vital Signs Assessment: post-procedure vital signs reviewed and stable Respiratory status: spontaneous breathing, nonlabored ventilation, respiratory function stable and patient connected to nasal cannula oxygen Cardiovascular status: blood pressure returned to baseline and stable Postop Assessment: no apparent nausea or vomiting Anesthetic complications: no   No notable events documented.  Last Vitals:  Vitals:   09/26/23 1700 09/26/23 1730  BP: (!) 144/93   Pulse: 93 96  Resp: 13 16  Temp:    SpO2: 95% 96%    Last Pain:  Vitals:   09/26/23 1645  TempSrc:   PainSc: 0-No pain                 Thom JONELLE Peoples

## 2023-09-26 NOTE — Op Note (Signed)
 Operative Note  Preoperative diagnosis:  1.  Localized prostate cancer  Postoperative diagnosis: 1.  Localized prostate cancer  Procedure(s): 1.  Robotic assisted laparoscopic radical prostatectomy (non nerve sparing) 2.  Robotic assisted laparoscopic bilateral pelvic lymph node dissection  Surgeon: Jackey Pea  Assistants:   Gretel Ferrara.   An assistant was required for this surgical procedure.  The duties of the assistant included but were not limited to suctioning, passing suture, camera manipulation, retraction.  This procedure would not be able to be performed without an Geophysicist/field seismologist.   Resident:  Anesthesia:  General  Complications:  Left Obturator nerve cautery injury   EBL:  250cc  Specimens: 1.  Prostate with seminal vesicles 2.  Periprostatic fat 3. Bilateral pelvic lymph nodes  Drains/Catheters: 1.  18 French Foley catheter 2. 12fr JP drain.   Intraoperative findings:   Large median lobe of prostate   No nerve spare.   Excellent hemostasis.  Water -tight anastomosis without leak.  Indication:  OSWELL SAY is a 63 y.o. malewho initially presented with an elevated PSA.  Prostate biopsy showed Gleason 3+4 prostate cancer involving the Lefr apex, left mid medial and left mid lateral side of the prostate.  Treatment options were discussed with him at length and he chose robotic assisted laparoscopic radical prostatectomy.  He has a SHIIM of 2 and the plan is for non nerve sparing.  Bilateral pelvic lymph node dissection was planed due to his risk stratification.  Description of procedure: The indications, alternatives, benefits, and risks were discussed with the patient and informed symptoms obtained.  The patient was brought to the operating room table, positioned supine and secured to the bed with a safety strap.  All pressure points were carefully padded and pneumatic compression devices were placed on lower extremities.  After the administration of  intravenous antibiotics, subcutaneous heparin  preoperatively  and general endotracheal anesthesia, the patient was repositioned in the dorsal lithotomy position using well-padded Allen stirrups.  The arms were carefully tucked at the patient's side and secured with padding.  The chest was secured in place with foam padding and cloth tape and the table was positioned in approximately 30 degree Trendelenburg.  The patient's abdomen, genitalia, and upper thighs were prepped and draped in the standard sterile manner.  A time was completed, verifying the correct patient, surgical procedure and positioning prior to beginning the procedure.  An 78 French urethral catheter was inserted to drain the bladder.  Incision was made 1cm above the umbilicus. Dissection was taken down to the fascia.  Pneumoperitoneum was introduced by placing a Veress needle into the abdomen superior to the umbilicus and insufflated with CO2 to a pressure of 10 mmHg. An 5 mm visiport was placed 1 cm of the umbilicus..  The 0 degree camera was then passed under direct visualization.  The abdominal cavity was examined for any sign of injury, adhesions, and identification of anatomic landmarks.  The remainder of the trochars were placed which included 2 separate 8 millimeter robotic trochars which were placed 9 cm laterally and inferiorly to the initially placed camera trocar.  A 12 mm trocar was placed 8 mm lateral to the right robotic trocar.  A separate 8 mm robotic trocar was placed 8 cm lateral to the previously placed left robotic trocar on the left side.  A 5 mm trocar was placed to the right and well above the umbilicus which approximately 10 and 12 cm away from the right-sided trochars.  The robot was then docked.  I placed monopolar scissors in the right hand, a fenestrated bipolar in the left hand and a prograsp in the fourth arm.  The sigmoid colon was then released from its lateral attachments setting to be retracted cranially.  Once  this dissection was completed.  Due to the narrowness of the cul-de-sac decision was made to do an anterior approach for the seminal vesicles and vas deferens.   The urachus and median umbilical ligament was divided and we developed the space of Retzius down to the pubic bone.  I divided the parietal peritoneum laterally up to the vas deferens on each side.  Using the prograsp forcep to provide cranial traction on the urachus, the prostate was then defatted above the prostatic vesicle junction, and the superficial dorsal venous complex was coagulated with bipolar and divided.  We submitted the periprostatic fat for pathological specimen.  The endopelvic fascia was sharply opened bilaterally and the levator muscle fibers were swept posterior laterally allowing for visualization of the deep dorsal venous complex and apex of the prostate.  The puboprostatic ligaments were sharply divided and care was taken to preserve the dorsal venous complex.  The dorsal venous complex was then stapled using a 45 mm endovascular stapler.   0 degree lens was kept in place. I identified the bladder neck by pulling a Foley catheter.  The fourth arm was applying cranial traction on the urachus.  I divided the anterior bladder neck musculature until I found the anterior bladder neck mucosa which was then incised. I identified the Foley catheter within, the balloon was deflated, we pulled the Foley catheter out into the operating field.  The assistant then used a grasper to apply traction to the Foley catheter and the surgical tech then placed a Jordan on the catheter near the penis for optimal retraction.  I then divided the lateral bladder neck mucosa in the posterior bladder neck mucosa.  There was a large median lobe that needed to be enucleated.  We were able to dissect the median lobe off the mucosa and able to preserve the bladder neck without injuring it or backwall it.  I was well away from the bilateral ureteral orifices.   I divided the posterior bladder neck musculature until I discovered the longitudinal fibers and kept dissecting until I found the vas deferens.      I divided the Denonvilliers fascia beneath the prostate and developed the prostate off the rectum.  Of note this was very stuck in some of the was required to be developed sharply.  .  I then addressed the pedicles, first starting with the right and then moving towards the left.  On the left side  no nerve spare was completed.  I then did a partial nerve spare by dividing the lateral pelvic fascia off of the prostatic capsule laterally.  I then isolated the pedicles of the prostate and used the bipolar cautery to burn the pedicles and then divided the pedicles with cold scissors.  I continued to divide the neurovascular bundles off of the prostate out to the apex of the prostate.  At this point the prostate was essentially freed up except for the urethra.   .  The anterior urethra was then sharply divided with cold scissors.  The Foley catheter was then pulled back and we divided the posterior urethral wall. Patent venous sinuses were then oversewn with a 4-0 Vicryl suture.  The specimen was then placed in a Endo Catch bag and then the bag was placed  in the upper abdomen out of the way.  I then irrigated the pelvis.  We performed a rectal test by instilling air into the rectal Foley.  The test was negative.  There is no concern for rectal injury.  I then over sewed a few bleeders alongside the pedicles with a 4-0 Vicryl suture on an RB1 needle.  I then performed a bilateral pelvic lymph node dissection by incising the fascia overlying the right external iliac vein, dissecting distally.  I went just distal to the node of Cloquet were replaced clips and then divided the lymphatics.  The lateral aspect of the dissection was the pelvic sidewall, inferior was the obturator nerve and proximal of the hypogastric vessels.  I placed clips at the proximal aspect and then  divided the lymphatics.  The specimen was removed with the scope grasper and sent to pathology.  Grossly, there were no enlarged lymph nodes.  This was performed on both the right and left pelvic lymph nodes.  During the left-sided pelvic lymph node dissection there was a cautery injury to the left obturator nerve.  With good hemostasis confirmed, I then did the posterior reconstruction with a Rocco stitch.  I used a 3-0 Vloc suture on an RB1 needle.  I passed the sutures through the retrotrigonal fascia beneath the bladder on the right side and then to the rectourethralis 4 throws were done on each side, I ran this from right to left.  And then took the other end of the suture passing just proximal to the posterior bladder neck in the midline into the posterior serrated sphincter and around this from right to left using 3 throws and reapproximated the sutures.  I then completed the urethrovesical anastomosis using a 3-0 Vloc suture double-armed on an RB1 needle.  I passed both ends of the suture from the outside in through the bladder neck at the 6 o'clock position.  I passed both through the urethral stump from the inside out and the corresponding position.  I reapproximated the bladder neck to the urethra.  I then ran the left suture on the left side anastomosis to the 9 o'clock position.  And then went back to the right-sided suture around that up the right side of the 12 o'clock position.  I then continued the left suture to the 12 o'clock position. I identified the ureteral orifices and ensured that these were not incorporated with the sutures.  I then placed a new 18 French Foley catheter into the bladder and filled it with 10 cc of sterile water .  I then secured the knot and then passed the suture behind the pubic bone for anterior suspension.  The bladder was irrigated with 200 cc of water .  There was no leak.  Hemostasis was excellent.  A 19 [place] JP drain was placed through the previously placed  fourth arm.  We did place a Carter-Thomason 0 vicryl suture through the 12 mm assistant port. The robot was undocked and all the trochars were removed under direct vision.    I enlarged the umbilical trocar site large enough to remove the prostate and closed the fascia with 0 PDS sutures in figure-of-eight interrupted fashion.  All the port sites were irrigated.  Exparel  was injected to the trocar sites.  The skin was closed with 4-0 Monocryl in a running subcuticular fashion.  Skin glue was applied.  At this point, the patient was extubated and awakened in the operating room and taken to recovery room in stable condition.  There were no immediate complications.  All counts were correct.  Plan: Admit for observation overnight.  Clear liquids tonight.  Regular for breakfast tomorrow.  Anticipate discharge home tomorrow.  Steffan Pea, MD [facility]

## 2023-09-27 ENCOUNTER — Encounter (HOSPITAL_COMMUNITY): Payer: Self-pay | Admitting: Urology

## 2023-09-27 LAB — CBC
HCT: 34.4 % — ABNORMAL LOW (ref 39.0–52.0)
Hemoglobin: 10.8 g/dL — ABNORMAL LOW (ref 13.0–17.0)
MCH: 28.8 pg (ref 26.0–34.0)
MCHC: 31.4 g/dL (ref 30.0–36.0)
MCV: 91.7 fL (ref 80.0–100.0)
Platelets: 333 K/uL (ref 150–400)
RBC: 3.75 MIL/uL — ABNORMAL LOW (ref 4.22–5.81)
RDW: 13.8 % (ref 11.5–15.5)
WBC: 10.9 K/uL — ABNORMAL HIGH (ref 4.0–10.5)
nRBC: 0 % (ref 0.0–0.2)

## 2023-09-27 LAB — BASIC METABOLIC PANEL WITH GFR
Anion gap: 10 (ref 5–15)
BUN: 14 mg/dL (ref 8–23)
CO2: 24 mmol/L (ref 22–32)
Calcium: 9.2 mg/dL (ref 8.9–10.3)
Chloride: 102 mmol/L (ref 98–111)
Creatinine, Ser: 0.84 mg/dL (ref 0.61–1.24)
GFR, Estimated: >60 mL/min (ref >60)
Glucose, Bld: 168 mg/dL — ABNORMAL HIGH (ref 70–99)
Potassium: 4 mmol/L (ref 3.5–5.1)
Sodium: 136 mmol/L (ref 135–145)

## 2023-09-27 LAB — GLUCOSE, CAPILLARY
Glucose-Capillary: 135 mg/dL — ABNORMAL HIGH (ref 70–99)
Glucose-Capillary: 151 mg/dL — ABNORMAL HIGH (ref 70–99)
Glucose-Capillary: 142 mg/dL — ABNORMAL HIGH (ref 70–99)
Glucose-Capillary: 150 mg/dL — ABNORMAL HIGH (ref 70–99)

## 2023-09-27 LAB — HEMOGLOBIN AND HEMATOCRIT, BLOOD
HCT: 34.9 % — ABNORMAL LOW (ref 39.0–52.0)
Hemoglobin: 11.1 g/dL — ABNORMAL LOW (ref 13.0–17.0)

## 2023-09-27 LAB — CREATININE, FLUID (PLEURAL, PERITONEAL, JP DRAINAGE): Creat, Fluid: 0.7 mg/dL

## 2023-09-27 MED ORDER — SENNOSIDES-DOCUSATE SODIUM 8.6-50 MG PO TABS
2.0000 | ORAL_TABLET | Freq: Two times a day (BID) | ORAL | Status: DC
  Administered 2023-09-27 – 2023-09-30 (×6): 2 via ORAL
  Filled 2023-09-27 (×6): qty 2

## 2023-09-27 MED ORDER — HEPARIN SODIUM (PORCINE) 5000 UNIT/ML IJ SOLN
5000.0000 [IU] | Freq: Three times a day (TID) | INTRAMUSCULAR | Status: DC
  Administered 2023-09-27 – 2023-09-30 (×9): 5000 [IU] via SUBCUTANEOUS
  Filled 2023-09-27 (×9): qty 1

## 2023-09-27 MED ORDER — POLYETHYLENE GLYCOL 3350 17 G PO PACK
17.0000 g | PACK | Freq: Two times a day (BID) | ORAL | Status: DC
  Administered 2023-09-27 – 2023-09-28 (×3): 17 g via ORAL
  Filled 2023-09-27 (×6): qty 1

## 2023-09-27 MED ORDER — METHOCARBAMOL 500 MG PO TABS
1000.0000 mg | ORAL_TABLET | Freq: Four times a day (QID) | ORAL | Status: DC
  Administered 2023-09-27 – 2023-09-30 (×15): 1000 mg via ORAL
  Filled 2023-09-27 (×15): qty 2

## 2023-09-27 NOTE — Progress Notes (Signed)
 While rounding with patient, patient verbalized that he felt something wet on his leg as if he were urinating. While assessing foley catheter, writer visualized urine on patient's scrotum and on the bed linen. Assessed foley catheter. Noted 10.5 cc of fluid was in foley catheter balloon. Reinserted fluid in foley catheter. While assisting patient to dangle on side of bed, writer noted increased amount of bloody urine exiting around the foley catheter. Corporate investment banker and charge nurse of events. Surgeon came bedside to assess.

## 2023-09-27 NOTE — TOC Initial Note (Signed)
 Transition of Care [facility]) - Initial/Assessment Note    Patient Details  Name: [name] MRN: [id number] Date of Birth: 1960-10-22  Transition of Care [facility]) CM/SW Contact:    Bascom Service, RN Phone Number: 09/27/2023, 4:17 PM  Clinical Narrative: d/c plan home. No preference. HHPT-[employer] rep Lynette;home rw-[employer] Jermaine to deliver to rm prior d/c.                   Expected Discharge Plan: Home w Home Health Services Barriers to Discharge: Continued Medical Work up   Patient Goals and CMS Choice Patient states their goals for this hospitalization and ongoing recovery are:: Home CMS Medicare.gov Compare Post Acute Care list provided to:: Patient Represenative (must comment) Choice offered to / list presented to : Spouse [place] ownership interest in [facility].provided to:: Spouse    Expected Discharge Plan and Services   Discharge Planning Services: CM Consult Post Acute Care Choice: Home Health                     DME Agency: [employer] Date DME Agency Contacted: 09/27/23 Time DME Agency Contacted: [phone] Representative spoke with at DME Agency: [place]            Prior Living Arrangements/Services   Lives with:: Spouse                   Activities of Daily Living   ADL Screening (condition at time of admission) Independently performs ADLs?: Yes (appropriate for developmental age) Is the patient deaf or have difficulty hearing?: Yes Does the patient have difficulty seeing, even when wearing glasses/contacts?: Yes Does the patient have difficulty concentrating, remembering, or making decisions?: No  Permission Sought/Granted                  Emotional Assessment              Admission diagnosis:  Prostate cancer [facility]) [C61] Patient Active Problem List   Diagnosis Date Noted   Prostate cancer (HCC) 09/26/2023   Sensorineural hearing loss, bilateral 07/25/2023   Tinnitus of both ears 07/25/2023   Pre-op  evaluation 05/31/2023   Ulcerative colitis without complications (HCC) 03/02/2022   Type 2 diabetes mellitus with complication, without long-term current use of insulin  (HCC) 11/24/2019   STEMI (ST elevation myocardial infarction) (HCC) 11/23/2019   STEMI involving left anterior descending coronary artery (HCC) 11/23/2019   Coronary artery disease    Hyperlipidemia LDL goal <70    Pure hypercholesterolemia 12/25/2012   Coronary atherosclerosis of native coronary artery 12/25/2012   Iron deficiency anemia, unspecified 12/25/2012   Anemia, unspecified 12/25/2012   Allergic rhinitis 12/25/2012   GERD (gastroesophageal reflux disease) 12/25/2012   Left sided ulcerative (chronic) colitis (HCC) 12/25/2012   Pain in joint, pelvic region and thigh 12/25/2012   Other and unspecified hyperlipidemia 12/25/2012   Other specified conditions influencing health status(V49.89) 12/25/2012   Elevated prostate specific antigen (PSA) 12/25/2012   Extrinsic asthma 12/25/2012   Hypercalcemia 12/25/2012   Mixed hyperlipidemia 12/25/2012   Ankylosing spondylitis (HCC) 12/25/2012   PCP:  Gil Greig BRAVO, NP Pharmacy:   [id number] [place], [place] - [address] AT [facility] OF [facility] & [address] [place] [address] Phone: [phone] Fax: [phone]     Social Drivers of Health (SDOH) Social History: SDOH Screenings   Food Insecurity: No Food Insecurity (09/26/2023)  Housing: Low Risk  (  09/26/2023)  Transportation Needs: No Transportation Needs (09/26/2023)  Utilities: Not At Risk (09/26/2023)  Depression (PHQ2-9): Low Risk  (09/12/2023)  Tobacco Use: Medium Risk (09/26/2023)   SDOH Interventions:     Readmission Risk Interventions     No data to display

## 2023-09-27 NOTE — Progress Notes (Signed)
 1 Day Post-Op Subjective: Doing well had some bladder spasms and abdominal pain overnight. Not passing gas, tolerated CLD. Ambulated to bathroom 3x.   Objective: Vital signs in last 24 hours: Temp:  [97 F (36.1 C)-98.2 F (36.8 C)] 98 F (36.7 C) (07/11 0547) Pulse Rate:  [84-110] 84 (07/11 0547) Resp:  [10-18] 14 (07/11 0547) BP: (120-144)/(76-98) 129/76 (07/11 0547) SpO2:  [95 %-100 %] 99 % (07/11 0547) Weight:  [73 kg-75.6 kg] 75.6 kg (07/10 1802)  Intake/Output from previous day: 07/10 0701 - 07/11 0700 In: 2762.9 [I.V.:2262.9; IV Piggyback:500] Out: 1030 [Urine:500; Drains:230; Blood:250] Intake/Output this shift: Total I/O In: 712.9 [I.V.:662.9; IV Piggyback:50] Out: 360 [Urine:300; Drains:60]  Physical Exam:  General: Alert and oriented CV: RRR Lungs: Clear Abdomen: Soft, ND, ATTP; inc c/d/I, drain with serosanguinous output  GU: urine clear in tubing.  Ext: L leg with Adductional weakness but does have some function   Lab Results: Recent Labs    09/26/23 1815 09/27/23 0431  HGB 13.0 10.8*  HCT 40.7 34.4*   BMET Recent Labs    09/26/23 1608 09/27/23 0431  NA 135 136  K 4.1 4.0  CL 104 102  CO2 19* 24  GLUCOSE 194* 168*  BUN 12 14  CREATININE 0.81 0.84  CALCIUM  9.1 9.2     Studies/Results: No results found.  Assessment/Plan: 35 M w/ GG2 PCa w/ RALP 7/10.   # RALP  - advance to reg diet - continue catheter - fluid creatinine from drain ordered - pain management  - bowel management - repeat H&H in PM if stable start heparin .   # L obturator nerve cautery injury - bed rest except for bathroom  - PT consulted for evaluation    LOS: 0 days   Jackey Pea MD 09/27/2023, 7:00 AM [facility]

## 2023-09-27 NOTE — TOC Initial Note (Signed)
 Transition of Care [facility]) - Initial/Assessment Note    Patient Details  Name: [name] MRN: [id number] Date of Birth: 02-10-1961  Transition [facility] Care [facility]) CM/SW Contact:    Bascom Service, RN Phone Number: 09/27/2023, 11:46 AM  Clinical Narrative: d/c plan home.                    Barriers to Discharge: Continued Medical Work up   Patient Goals and CMS Choice Patient states their goals for this hospitalization and ongoing recovery are:: Home CMS Medicare.gov Compare Post Acute Care list provided to:: Patient Choice offered to / list presented to : Patient [place] ownership interest in [facility].provided to:: Patient    Expected Discharge Plan and Services                                              Prior Living Arrangements/Services                       Activities of Daily Living   ADL Screening (condition at time of admission) Independently performs ADLs?: Yes (appropriate for developmental age) Is the patient deaf or have difficulty hearing?: Yes Does the patient have difficulty seeing, even when wearing glasses/contacts?: Yes Does the patient have difficulty concentrating, remembering, or making decisions?: No  Permission Sought/Granted                  Emotional Assessment              Admission diagnosis:  Prostate cancer [facility]) [C61] Patient Active Problem List   Diagnosis Date Noted   Prostate cancer (HCC) 09/26/2023   Sensorineural hearing loss, bilateral 07/25/2023   Tinnitus of both ears 07/25/2023   Pre-op evaluation 05/31/2023   Ulcerative colitis without complications (HCC) 03/02/2022   Type 2 diabetes mellitus with complication, without long-term current use of insulin  (HCC) 11/24/2019   STEMI (ST elevation myocardial infarction) (HCC) 11/23/2019   STEMI involving left anterior descending coronary artery (HCC) 11/23/2019   Coronary artery disease    Hyperlipidemia LDL goal <70    Pure  hypercholesterolemia 12/25/2012   Coronary atherosclerosis of native coronary artery 12/25/2012   Iron deficiency anemia, unspecified 12/25/2012   Anemia, unspecified 12/25/2012   Allergic rhinitis 12/25/2012   GERD (gastroesophageal reflux disease) 12/25/2012   Left sided ulcerative (chronic) colitis (HCC) 12/25/2012   Pain in joint, pelvic region and thigh 12/25/2012   Other and unspecified hyperlipidemia 12/25/2012   Other specified conditions influencing health status(V49.89) 12/25/2012   Elevated prostate specific antigen (PSA) 12/25/2012   Extrinsic asthma 12/25/2012   Hypercalcemia 12/25/2012   Mixed hyperlipidemia 12/25/2012   Ankylosing spondylitis (HCC) 12/25/2012   PCP:  Gil Greig BRAVO, NP Pharmacy:   [facility] #[id number] [place], [place] - [address] AT [facility] OF [facility] & [address] [place] [address] Phone: [phone] Fax: [phone]     Social Drivers of Health (SDOH) Social History: SDOH Screenings   Food Insecurity: No Food Insecurity (09/26/2023)  Housing: Low Risk  (09/26/2023)  Transportation Needs: No Transportation Needs (09/26/2023)  Utilities: Not At Risk (09/26/2023)  Depression (PHQ2-9): Low Risk  (09/12/2023)  Tobacco Use: Medium Risk (09/26/2023)   SDOH Interventions:     Readmission Risk Interventions     No data  to display

## 2023-09-27 NOTE — Progress Notes (Signed)
 Urology called to bedside as patient was leaking urine around the catheter concerned that the patient had catheter pulled.  I interrogated the catheter personally was in the correct place catheter was irrigated clear low yellow returned likely small clot in the catheter tubing.  Abdomen soft.  Urine clear yellow with some sediment.  Patient received obturator nerve cautery injury will continue to receive physical therapy until stable to go home.  Patient will remain in house over the weekend.

## 2023-09-27 NOTE — Plan of Care (Signed)
  Problem: Pain Management: Goal: General experience of comfort will improve Outcome: Progressing   Problem: Urinary Elimination: Goal: Ability to achieve and maintain urine output will improve Outcome: Progressing    Problem: Clinical Measurements: Goal: Respiratory complications will improve Outcome: Progressing   Problem: Clinical Measurements: Goal: Cardiovascular complication will be avoided Outcome: Progressing

## 2023-09-27 NOTE — Evaluation (Signed)
 Physical Therapy Evaluation Patient Details Name: [name] MRN: [id number] DOB: December 28, 1960 Today's Date: 09/27/2023  History of Present Illness  63 yo male presents to therapy following hospital admission on 09/26/2023 for scheduled prostatectomy. Procedure complicated due to L obturator nerve cauter injury impacting L hip adduction motor coordination, control and strength. Pt PMH includes but is not limited to: anemia, Ankylosing spondylitis, arthritis, asthma, CAD, DM II, fibromyalgia, MI, GERD, HLD, colitis, prostate ca, polyneuropathy.  Clinical Impression    Pt admitted with above diagnosis.  Pt currently with functional limitations due to the deficits listed below (see PT Problem List). Pt seated in recliner when PT arrived. Pt indicated 8/10 lower abdominal pain and PT had coordinated with nursing staff prior to Eval to maximize benefits for pharmaceutical pain medication. Pt required min cues and CGA for sit to stand from recliner, CGA for safety with gait tasks, flexed posture and B UE support at RW in hallway 100 feet. Pt returned to recliner and all needs in place. Pt will benefit from acute skilled PT to increase their independence and safety with mobility to allow discharge.         If plan is discharge home, recommend the following: A little help with walking and/or transfers;A little help with bathing/dressing/bathroom;Assistance with cooking/housework;Assist for transportation;Help with stairs or ramp for entrance   Can travel by private vehicle        Equipment Recommendations Rolling walker (2 wheels)  Recommendations for Other Services       Functional Status Assessment Patient has had a recent decline in their functional status and demonstrates the ability to make significant improvements in function in a reasonable and predictable amount of time.     Precautions / Restrictions Precautions Precautions: Fall Restrictions Weight Bearing Restrictions Per Provider  Order: No      Mobility  Bed Mobility               General bed mobility comments: pt seated in recliner when PT arrived and returned to recliner at end of eval    Transfers Overall transfer level: Needs assistance Equipment used: Rolling walker (2 wheels) Transfers: Sit to/from Stand Sit to Stand: Contact guard assist           General transfer comment: cues for push to stand from B arm rest    Ambulation/Gait Ambulation/Gait assistance: Contact guard assist Gait Distance (Feet): 100 Feet Assistive device: Rolling walker (2 wheels) Gait Pattern/deviations: Step-through pattern, Trunk flexed Gait velocity: decreased     General Gait Details: trunk flexion due to Ankylosing spondylitis, B UE support at RW to offload B LE due to fatigue no overt LOB nor gait abnomalites attributed to L hip adductor weakness  Stairs            Wheelchair Mobility     Tilt Bed    Modified Rankin (Stroke Patients Only)       Balance Overall balance assessment: Mild deficits observed, not formally tested                                           Pertinent Vitals/Pain Pain Assessment Pain Assessment: 0-10 Pain Score: 8  Pain Location: abdomen Pain Descriptors / Indicators: Aching, Constant, Discomfort, Pressure Pain Intervention(s): Limited activity within patient's tolerance, Monitored during session, Premedicated before session, Repositioned    Home Living Family/patient expects to be discharged to:: Private  residence Living Arrangements: Spouse/significant other Available Help at Discharge: Family Type of Home: House Home Access: Stairs to enter Entrance Stairs-Rails: Doctor, general practice of Steps: 2   Home Layout: One level Home Equipment: None      Prior Function Prior Level of Function : Independent/Modified Independent             Mobility Comments: IND no AD for all ADLs, self care tasks and IADLs        Extremity/Trunk Assessment        Lower Extremity Assessment Lower Extremity Assessment: LLE deficits/detail;RLE deficits/detail RLE Deficits / Details: R LE 5/5 throughout, L LE 5/5 with the exception of L hiip adduction 3+/5 seated RLE Sensation: history of peripheral neuropathy LLE Deficits / Details: , L LE 5/5 with the exception of L hiip adduction 3+/5 seated LLE Sensation: history of peripheral neuropathy    Cervical / Trunk Assessment Cervical / Trunk Assessment: Other exceptions;Kyphotic (Ankylosing spondylitis)  Communication   Communication Communication: Impaired Factors Affecting Communication: Hearing impaired    Cognition Arousal: Alert Behavior During Therapy: WFL for tasks assessed/performed   PT - Cognitive impairments: No apparent impairments                         Following commands: Intact       Cueing       General Comments      Exercises     Assessment/Plan    PT Assessment Patient needs continued PT services  PT Problem List Decreased strength;Decreased activity tolerance;Decreased balance;Decreased mobility;Pain       PT Treatment Interventions DME instruction;Gait training;Stair training;Functional mobility training;Therapeutic activities;Balance training;Therapeutic exercise;Neuromuscular re-education;Patient/family education    PT Goals (Current goals can be found in the Care Plan section)  Acute Rehab PT Goals Patient Stated Goal: to be able to go home this weekend and get my L leg stronger PT Goal Formulation: With patient Time For Goal Achievement: 10/11/23 Potential to Achieve Goals: Good    Frequency Min 3X/week     Co-evaluation               AM-PAC PT 6 Clicks Mobility  Outcome Measure Help needed turning from your back to your side while in a flat bed without using bedrails?: A Little Help needed moving from lying on your back to sitting on the side of a flat bed without using bedrails?: A  Little Help needed moving to and from a bed to a chair (including a wheelchair)?: A Little Help needed standing up from a chair using your arms (e.g., wheelchair or bedside chair)?: A Little Help needed to walk in hospital room?: A Little Help needed climbing 3-5 steps with a railing? : Total 6 Click Score: 16    End of Session Equipment Utilized During Treatment: Gait belt Activity Tolerance: Patient tolerated treatment well Patient left: in chair;with call bell/phone within reach;with family/visitor present Nurse Communication: Mobility status PT Visit Diagnosis: Unsteadiness on feet (R26.81);Muscle weakness (generalized) (M62.81);Difficulty in walking, not elsewhere classified (R26.2);Pain Pain - part of body:  (abdomen)    Time: 8769-8744 PT Time Calculation (min) (ACUTE ONLY): 25 min   Charges:   PT Evaluation $PT Eval Low Complexity: 1 Low PT Treatments $Gait Training: 8-22 mins PT General Charges $$ ACUTE PT VISIT: 1 Visit         [place], PT Acute Rehab   [place] VEAR Drone 09/27/2023, 3:35 PM

## 2023-09-28 ENCOUNTER — Observation Stay (HOSPITAL_COMMUNITY)

## 2023-09-28 DIAGNOSIS — Y836 Removal of other organ (partial) (total) as the cause of abnormal reaction of the patient, or of later complication, without mention of misadventure at the time of the procedure: Secondary | ICD-10-CM | POA: Diagnosis not present

## 2023-09-28 DIAGNOSIS — M797 Fibromyalgia: Secondary | ICD-10-CM | POA: Diagnosis present

## 2023-09-28 DIAGNOSIS — J45909 Unspecified asthma, uncomplicated: Secondary | ICD-10-CM | POA: Diagnosis present

## 2023-09-28 DIAGNOSIS — I2510 Atherosclerotic heart disease of native coronary artery without angina pectoris: Secondary | ICD-10-CM | POA: Diagnosis present

## 2023-09-28 DIAGNOSIS — N3289 Other specified disorders of bladder: Secondary | ICD-10-CM | POA: Diagnosis not present

## 2023-09-28 DIAGNOSIS — N390 Urinary tract infection, site not specified: Secondary | ICD-10-CM | POA: Diagnosis present

## 2023-09-28 DIAGNOSIS — M545 Low back pain, unspecified: Secondary | ICD-10-CM | POA: Diagnosis present

## 2023-09-28 DIAGNOSIS — G9781 Other intraoperative complications of nervous system: Secondary | ICD-10-CM | POA: Diagnosis not present

## 2023-09-28 DIAGNOSIS — I10 Essential (primary) hypertension: Secondary | ICD-10-CM | POA: Diagnosis present

## 2023-09-28 DIAGNOSIS — F32A Depression, unspecified: Secondary | ICD-10-CM | POA: Diagnosis present

## 2023-09-28 DIAGNOSIS — E785 Hyperlipidemia, unspecified: Secondary | ICD-10-CM | POA: Diagnosis present

## 2023-09-28 DIAGNOSIS — J9811 Atelectasis: Secondary | ICD-10-CM | POA: Diagnosis not present

## 2023-09-28 DIAGNOSIS — C61 Malignant neoplasm of prostate: Secondary | ICD-10-CM | POA: Diagnosis not present

## 2023-09-28 DIAGNOSIS — E119 Type 2 diabetes mellitus without complications: Secondary | ICD-10-CM | POA: Diagnosis present

## 2023-09-28 DIAGNOSIS — I252 Old myocardial infarction: Secondary | ICD-10-CM | POA: Diagnosis not present

## 2023-09-28 DIAGNOSIS — M459 Ankylosing spondylitis of unspecified sites in spine: Secondary | ICD-10-CM | POA: Diagnosis present

## 2023-09-28 DIAGNOSIS — Z8701 Personal history of pneumonia (recurrent): Secondary | ICD-10-CM | POA: Diagnosis not present

## 2023-09-28 DIAGNOSIS — E7800 Pure hypercholesterolemia, unspecified: Secondary | ICD-10-CM | POA: Diagnosis present

## 2023-09-28 DIAGNOSIS — Z79899 Other long term (current) drug therapy: Secondary | ICD-10-CM | POA: Diagnosis not present

## 2023-09-28 DIAGNOSIS — R000 Tachycardia, unspecified: Secondary | ICD-10-CM | POA: Diagnosis not present

## 2023-09-28 DIAGNOSIS — K219 Gastro-esophageal reflux disease without esophagitis: Secondary | ICD-10-CM | POA: Diagnosis present

## 2023-09-28 DIAGNOSIS — Z7409 Other reduced mobility: Secondary | ICD-10-CM | POA: Diagnosis not present

## 2023-09-28 DIAGNOSIS — Z955 Presence of coronary angioplasty implant and graft: Secondary | ICD-10-CM | POA: Diagnosis not present

## 2023-09-28 DIAGNOSIS — Z7984 Long term (current) use of oral hypoglycemic drugs: Secondary | ICD-10-CM | POA: Diagnosis not present

## 2023-09-28 DIAGNOSIS — Z85828 Personal history of other malignant neoplasm of skin: Secondary | ICD-10-CM | POA: Diagnosis not present

## 2023-09-28 DIAGNOSIS — F40240 Claustrophobia: Secondary | ICD-10-CM | POA: Diagnosis present

## 2023-09-28 LAB — BASIC METABOLIC PANEL WITH GFR
Anion gap: 10 (ref 5–15)
BUN: 10 mg/dL (ref 8–23)
CO2: 24 mmol/L (ref 22–32)
Calcium: 9.1 mg/dL (ref 8.9–10.3)
Chloride: 98 mmol/L (ref 98–111)
Creatinine, Ser: 0.86 mg/dL (ref 0.61–1.24)
GFR, Estimated: >60 mL/min (ref >60)
Glucose, Bld: 163 mg/dL — ABNORMAL HIGH (ref 70–99)
Potassium: 3.6 mmol/L (ref 3.5–5.1)
Sodium: 132 mmol/L — ABNORMAL LOW (ref 135–145)

## 2023-09-28 LAB — CBC
HCT: 34.9 % — ABNORMAL LOW (ref 39.0–52.0)
HCT: 35.6 % — ABNORMAL LOW (ref 39.0–52.0)
Hemoglobin: 11.4 g/dL — ABNORMAL LOW (ref 13.0–17.0)
Hemoglobin: 11.4 g/dL — ABNORMAL LOW (ref 13.0–17.0)
MCH: 29.5 pg (ref 26.0–34.0)
MCH: 29 pg (ref 26.0–34.0)
MCHC: 32.7 g/dL (ref 30.0–36.0)
MCHC: 32 g/dL (ref 30.0–36.0)
MCV: 90.4 fL (ref 80.0–100.0)
MCV: 90.6 fL (ref 80.0–100.0)
Platelets: 356 K/uL (ref 150–400)
Platelets: 375 K/uL (ref 150–400)
RBC: 3.86 MIL/uL — ABNORMAL LOW (ref 4.22–5.81)
RBC: 3.93 MIL/uL — ABNORMAL LOW (ref 4.22–5.81)
RDW: 13.9 % (ref 11.5–15.5)
RDW: 13.8 % (ref 11.5–15.5)
WBC: 15.8 K/uL — ABNORMAL HIGH (ref 4.0–10.5)
WBC: 18.3 K/uL — ABNORMAL HIGH (ref 4.0–10.5)
nRBC: 0 % (ref 0.0–0.2)
nRBC: 0 % (ref 0.0–0.2)

## 2023-09-28 LAB — GLUCOSE, CAPILLARY
Glucose-Capillary: 157 mg/dL — ABNORMAL HIGH (ref 70–99)
Glucose-Capillary: 171 mg/dL — ABNORMAL HIGH (ref 70–99)
Glucose-Capillary: 153 mg/dL — ABNORMAL HIGH (ref 70–99)
Glucose-Capillary: 174 mg/dL — ABNORMAL HIGH (ref 70–99)

## 2023-09-28 MED ORDER — KETOROLAC TROMETHAMINE 15 MG/ML IJ SOLN
15.0000 mg | Freq: Four times a day (QID) | INTRAMUSCULAR | Status: DC | PRN
  Administered 2023-09-28 – 2023-09-29 (×3): 15 mg via INTRAVENOUS
  Filled 2023-09-28 (×3): qty 1

## 2023-09-28 MED ORDER — SODIUM CHLORIDE 0.9 % IV SOLN
2.0000 g | Freq: Three times a day (TID) | INTRAVENOUS | Status: DC
  Administered 2023-09-28 – 2023-09-30 (×6): 2 g via INTRAVENOUS
  Filled 2023-09-28 (×6): qty 12.5

## 2023-09-28 MED ORDER — IOHEXOL 350 MG/ML SOLN
75.0000 mL | Freq: Once | INTRAVENOUS | Status: AC | PRN
  Administered 2023-09-28: 75 mL via INTRAVENOUS

## 2023-09-28 NOTE — Progress Notes (Signed)
 Mobility Specialist - Progress Note   09/28/23 0904  Mobility  Activity Ambulated with assistance in hallway  Level of Assistance Standby assist, set-up cues, supervision of patient - no hands on  Assistive Device Front wheel walker  Distance Ambulated (ft) 200 ft  Range of Motion/Exercises Active  Activity Response Tolerated well  Mobility Referral Yes  Mobility visit 1 Mobility  Mobility Specialist Start Time (ACUTE ONLY) 0850  Mobility Specialist Stop Time (ACUTE ONLY) 0904  Mobility Specialist Time Calculation (min) (ACUTE ONLY) 14 min   Pt was found on recliner chair and agreeable to ambulate. C/o 6/10 abdominal pain prior and during session. At EOS returned to recliner chair with all needs met. Call bell in reach.  Erminio Leos,  Mobility Specialist Can be reached via Secure Chat

## 2023-09-28 NOTE — Progress Notes (Signed)
   Subjective Episode of significant shortness of breath overnight, ultimately improved with multiple nebulizers Continues to have left leg weakness, lower abdominal pain Episode of tachycardia up to 130 this morning Tolerating diet  Physical Exam: BP 122/81 (BP Location: Left Leg)   Pulse (!) 128   Temp 98.3 F (36.8 C) (Oral)   Resp 17   Ht 5' 8 (1.727 m)   Wt 75.6 kg   SpO2 95%   BMI 25.34 kg/m    Constitutional:  Alert and oriented, No acute distress. Respiratory: Normal respiratory effort, no increased work of breathing. GI: Abdomen is soft, non-tender, non-distended GU: Foley with clear yellow urine  Laboratory Data: Reviewed in epic, leukocytosis to 15.8(10.9), hematocrit stable at 34.9 Normal creatinine, 0.86 and stable from prior   Assessment & Plan:   63 year old male with asthma at baseline status post robotic radical prostatectomy and bilateral pelvic lymph node dissection with Dr. Shane on 7/10, complicated by left obturator nerve cautery injury.  In the setting of shortness of breath and tachycardia, decreased mobility in the setting of the obturator nerve injury, I recommended a stat CT PE protocol.  Reviewed my concerns with patient at bedside today.  -Stat CT PE protocol -Pending above results, can continue to work with PT, anticipate remaining in house over the weekend -Continue Foley   [place] JAYSON Burnet, MD

## 2023-09-28 NOTE — Plan of Care (Signed)
  Problem: Pain Management: Goal: General experience of comfort will improve Outcome: Progressing   Problem: Urinary Elimination: Goal: Ability to achieve and maintain urine output will improve Outcome: Progressing   Problem: Urinary Elimination: Goal: Ability to avoid or minimize complications of infection will improve Outcome: Progressing   Problem: Clinical Measurements: Goal: Respiratory complications will improve Outcome: Progressing   Problem: Clinical Measurements: Goal: Cardiovascular complication will be avoided Outcome: Progressing   Problem: Activity: Goal: Risk for activity intolerance will decrease Outcome: Progressing

## 2023-09-29 LAB — BASIC METABOLIC PANEL WITH GFR
Anion gap: 11 (ref 5–15)
BUN: 18 mg/dL (ref 8–23)
CO2: 23 mmol/L (ref 22–32)
Calcium: 9 mg/dL (ref 8.9–10.3)
Chloride: 98 mmol/L (ref 98–111)
Creatinine, Ser: 0.86 mg/dL (ref 0.61–1.24)
GFR, Estimated: >60 mL/min (ref >60)
Glucose, Bld: 139 mg/dL — ABNORMAL HIGH (ref 70–99)
Potassium: 3.6 mmol/L (ref 3.5–5.1)
Sodium: 132 mmol/L — ABNORMAL LOW (ref 135–145)

## 2023-09-29 LAB — CBC
HCT: 32 % — ABNORMAL LOW (ref 39.0–52.0)
Hemoglobin: 10.6 g/dL — ABNORMAL LOW (ref 13.0–17.0)
MCH: 29.8 pg (ref 26.0–34.0)
MCHC: 33.1 g/dL (ref 30.0–36.0)
MCV: 89.9 fL (ref 80.0–100.0)
Platelets: 327 K/uL (ref 150–400)
RBC: 3.56 MIL/uL — ABNORMAL LOW (ref 4.22–5.81)
RDW: 13.7 % (ref 11.5–15.5)
WBC: 12.2 K/uL — ABNORMAL HIGH (ref 4.0–10.5)
nRBC: 0 % (ref 0.0–0.2)

## 2023-09-29 LAB — GLUCOSE, CAPILLARY
Glucose-Capillary: 133 mg/dL — ABNORMAL HIGH (ref 70–99)
Glucose-Capillary: 175 mg/dL — ABNORMAL HIGH (ref 70–99)
Glucose-Capillary: 126 mg/dL — ABNORMAL HIGH (ref 70–99)
Glucose-Capillary: 195 mg/dL — ABNORMAL HIGH (ref 70–99)

## 2023-09-29 MED ORDER — SODIUM CHLORIDE 0.9 % IV BOLUS
500.0000 mL | Freq: Once | INTRAVENOUS | Status: AC
  Administered 2023-09-29: 500 mL via INTRAVENOUS

## 2023-09-29 NOTE — Progress Notes (Signed)
   Subjective - CT PE yesterday morning for shortness of breath and tachycardia was negative - Pain control improved, tolerating diet, passing gas  Physical Exam: BP 131/77 (BP Location: Right Arm)   Pulse (!) 118   Temp 98.5 F (36.9 C) (Oral)   Resp (P) 18   Ht 5' 8 (1.727 m)   Wt 75.6 kg   SpO2 95%   BMI 25.34 kg/m    Constitutional:  Alert and oriented, No acute distress. Respiratory: Normal respiratory effort, no increased work of breathing. GI: Abdomen is soft, nondistended, minimally and appropriately tender, incisions clean dry and intact with Dermabond GU: Foley with clear yellow urine  Laboratory Data: Reviewed in epic WBC downtrending  Assessment & Plan:   63 year old male with asthma at baseline status post robotic radical prostatectomy and bilateral pelvic lymph node dissection with Dr. Shane on 7/10, complicated by left obturator nerve cautery injury.  Tachycardic and short of breath POD#2 and CT PE was negative.  Persistent tachycardia, pain control improved  - Continue abx - EKG today for persistent tachycardia - Continue to work with PT, potential discharge early this week - Continue general diet, IS, ambulate   [place] JAYSON Burnet, MD

## 2023-09-29 NOTE — Progress Notes (Signed)
 Physical Therapy Treatment Patient Details Name: [name] MRN: [id number] DOB: 09/07/1960 Today's Date: 09/29/2023   History of Present Illness 63 yo male presents to therapy following hospital admission on 09/26/2023 for scheduled prostatectomy. Procedure complicated due to L obturator nerve cauter injury impacting L hip adduction motor coordination, control and strength. Pt PMH includes but is not limited to: anemia, Ankylosing spondylitis, arthritis, asthma, CAD, DM II, fibromyalgia, MI, GERD, HLD, colitis, prostate ca, polyneuropathy.    PT Comments  Pt eager to mobilize and ambulated good distance in hallway with RW.  Spouse present today and reports she will be home to assist as needed and can help pt with 2 steps to enter home.  Pt eager to d/c and ready from mobility/PT standpoint, so RN notified.      If plan is discharge home, recommend the following: A little help with walking and/or transfers;A little help with bathing/dressing/bathroom;Assistance with cooking/housework;Assist for transportation;Help with stairs or ramp for entrance   Can travel by private vehicle        Equipment Recommendations  Rolling walker (2 wheels)    Recommendations for Other Services       Precautions / Restrictions Precautions Precautions: Fall     Mobility  Bed Mobility               General bed mobility comments: pt in recliner    Transfers Overall transfer level: Needs assistance Equipment used: Rolling walker (2 wheels) Transfers: Sit to/from Stand Sit to Stand: Contact guard assist, Supervision           General transfer comment: utilizes UE support to self assist    Ambulation/Gait Ambulation/Gait assistance: Contact guard assist, Supervision Gait Distance (Feet): 240 Feet Assistive device: Rolling walker (2 wheels) Gait Pattern/deviations: Step-through pattern, Trunk flexed Gait velocity: decreased     General Gait Details: trunk flexion due to  Ankylosing spondylitis, B UE support at RW to offload B LE due to fatigue, one instance of unsteadiness however pt self corrected with RW; no gait abnomalites attributed to L hip adductor weakness   Stairs             Wheelchair Mobility     Tilt Bed    Modified Rankin (Stroke Patients Only)       Balance                                            Communication Communication Communication: Impaired Factors Affecting Communication: Hearing impaired  Cognition Arousal: Alert Behavior During Therapy: WFL for tasks assessed/performed   PT - Cognitive impairments: No apparent impairments                         Following commands: Intact      Cueing    Exercises      General Comments        Pertinent Vitals/Pain Pain Assessment Pain Assessment: No/denies pain Pain Intervention(s): Monitored during session, Repositioned    Home Living                          Prior Function            PT Goals (current goals can now be found in the care plan section) Progress towards PT goals: Progressing toward goals    Frequency  Min 3X/week      PT Plan      Co-evaluation              AM-PAC PT 6 Clicks Mobility   Outcome Measure  Help needed turning from your back to your side while in a flat bed without using bedrails?: A Little Help needed moving from lying on your back to sitting on the side of a flat bed without using bedrails?: A Little Help needed moving to and from a bed to a chair (including a wheelchair)?: A Little Help needed standing up from a chair using your arms (e.g., wheelchair or bedside chair)?: A Little Help needed to walk in hospital room?: A Little Help needed climbing 3-5 steps with a railing? : A Little 6 Click Score: 18    End of Session Equipment Utilized During Treatment: Gait belt Activity Tolerance: Patient tolerated treatment well Patient left: in chair;with call bell/phone  within reach;with family/visitor present Nurse Communication: Mobility status PT Visit Diagnosis: Difficulty in walking, not elsewhere classified (R26.2)     Time: 8884-8873 PT Time Calculation (min) (ACUTE ONLY): 11 min  Charges:    $Gait Training: 8-22 mins PT General Charges $$ ACUTE PT VISIT: 1 Visit                     Tari KLEIN, DPT Physical Therapist Acute Rehabilitation Services Office: [phone]   Kati L Payson 09/29/2023, 1:09 PM

## 2023-09-29 NOTE — Progress Notes (Signed)
 Mobility Specialist - Progress Note   09/29/23 0931  Mobility  Activity Ambulated with assistance to bathroom  Level of Assistance Contact guard assist, steadying assist  Assistive Device Front wheel walker  Distance Ambulated (ft) 10 ft  Range of Motion/Exercises Active  Activity Response Tolerated well  Mobility Referral Yes  Mobility visit 1 Mobility  Mobility Specialist Start Time (ACUTE ONLY) 0920  Mobility Specialist Stop Time (ACUTE ONLY) 0930  Mobility Specialist Time Calculation (min) (ACUTE ONLY) 10 min   Pt requested assistance to bathroom. Was left in bathroom with NT in room.   Erminio Leos,  Mobility Specialist Can be reached via Secure Chat

## 2023-09-29 NOTE — Progress Notes (Signed)
 MEWS Progress Note  Patient Details Name: [name] MRN: [id number] DOB: September 20, 1960 Today's Date: 09/29/2023   MEWS Flowsheet Documentation:  Assess: MEWS Score Temp: 98 F (36.7 C) BP: 124/82 MAP (mmHg): 94 Pulse Rate: (!) 122 ECG Heart Rate: 98 Resp: 17 Level of Consciousness: Alert SpO2: 98 % O2 Device: Room Air Patient Activity (if Appropriate): In bed O2 Flow Rate (L/min): 6 L/min Assess: MEWS Score MEWS Temp: 0 MEWS Systolic: 0 MEWS Pulse: 2 MEWS RR: 0 MEWS LOC: 0 MEWS Score: 2 MEWS Score Color: Yellow Assess: SIRS CRITERIA SIRS Temperature : 0 SIRS Respirations : 0 SIRS Pulse: 1 SIRS WBC: 1 SIRS Score Sum : 2 SIRS Temperature : 0 SIRS Pulse: 1 SIRS Respirations : 0 SIRS WBC: 1 SIRS Score Sum : 2 Assess: if the MEWS score is Yellow or Red Were vital signs accurate and taken at a resting state?: Yes Does the patient meet 2 or more of the SIRS criteria?: Yes Does the patient have a confirmed or suspected source of infection?: No MEWS guidelines implemented : Yes, yellow Treat MEWS Interventions: Considered administering scheduled or prn medications/treatments as ordered Take Vital Signs Increase Vital Sign Frequency : Yellow: Q2hr x1, continue Q4hrs until patient remains green for 12hrs Escalate MEWS: Escalate: Yellow: Discuss with charge nurse and consider notifying provider and/or RRT

## 2023-09-29 NOTE — Plan of Care (Signed)
 Alert and oriented. Sinus tachycardia persists, vitals signs taken frequently. Urology MD Dr. Francisca aware.  Medicated for pain, see MAR for details. OOB to chair for majority of shift.  Declined to ambulate in hallways with staff today.   Problem: Education: Goal: Knowledge of the procedure and recovery process will improve Outcome: Progressing   Problem: Bowel/Gastric: Goal: Gastrointestinal status for postoperative course will improve Outcome: Progressing   Problem: Pain Management: Goal: General experience of comfort will improve Outcome: Progressing   Problem: Skin Integrity: Goal: Demonstration of wound healing without infection will improve Outcome: Progressing   Problem: Urinary Elimination: Goal: Ability to avoid or minimize complications of infection will improve Outcome: Progressing Goal: Ability to achieve and maintain urine output will improve Outcome: Progressing

## 2023-09-30 ENCOUNTER — Inpatient Hospital Stay (HOSPITAL_COMMUNITY)

## 2023-09-30 LAB — SURGICAL PATHOLOGY

## 2023-09-30 LAB — GLUCOSE, CAPILLARY
Glucose-Capillary: 126 mg/dL — ABNORMAL HIGH (ref 70–99)
Glucose-Capillary: 151 mg/dL — ABNORMAL HIGH (ref 70–99)
Glucose-Capillary: 108 mg/dL — ABNORMAL HIGH (ref 70–99)

## 2023-09-30 LAB — BASIC METABOLIC PANEL WITH GFR
Anion gap: 12 (ref 5–15)
BUN: 19 mg/dL (ref 8–23)
CO2: 22 mmol/L (ref 22–32)
Calcium: 8.5 mg/dL — ABNORMAL LOW (ref 8.9–10.3)
Chloride: 100 mmol/L (ref 98–111)
Creatinine, Ser: 0.7 mg/dL (ref 0.61–1.24)
GFR, Estimated: >60 mL/min (ref >60)
Glucose, Bld: 125 mg/dL — ABNORMAL HIGH (ref 70–99)
Potassium: 3.4 mmol/L — ABNORMAL LOW (ref 3.5–5.1)
Sodium: 134 mmol/L — ABNORMAL LOW (ref 135–145)

## 2023-09-30 LAB — CBC
HCT: 28.5 % — ABNORMAL LOW (ref 39.0–52.0)
Hemoglobin: 9.4 g/dL — ABNORMAL LOW (ref 13.0–17.0)
MCH: 29.7 pg (ref 26.0–34.0)
MCHC: 33 g/dL (ref 30.0–36.0)
MCV: 90.2 fL (ref 80.0–100.0)
Platelets: 356 K/uL (ref 150–400)
RBC: 3.16 MIL/uL — ABNORMAL LOW (ref 4.22–5.81)
RDW: 13.8 % (ref 11.5–15.5)
WBC: 9.5 K/uL (ref 4.0–10.5)
nRBC: 0 % (ref 0.0–0.2)

## 2023-09-30 LAB — LACTIC ACID, PLASMA: Lactic Acid, Venous: 1.2 mmol/L (ref 0.5–1.9)

## 2023-09-30 MED ORDER — METHOCARBAMOL 750 MG PO TABS: 750.0000 mg | ORAL_TABLET | Freq: Four times a day (QID) | ORAL | 0 refills | Status: AC

## 2023-09-30 MED ORDER — IOHEXOL 9 MG/ML PO SOLN
ORAL | Status: AC
  Filled 2023-09-30: qty 1000

## 2023-09-30 MED ORDER — SENNOSIDES-DOCUSATE SODIUM 8.6-50 MG PO TABS: 2.0000 | ORAL_TABLET | Freq: Every day | ORAL | 0 refills | Status: DC

## 2023-09-30 MED ORDER — SODIUM CHLORIDE (PF) 0.9 % IJ SOLN
INTRAMUSCULAR | Status: AC
  Filled 2023-09-30: qty 50

## 2023-09-30 MED ORDER — ASPIRIN 81 MG PO TBEC: 81.0000 mg | DELAYED_RELEASE_TABLET | Freq: Every day | ORAL | 12 refills | Status: DC

## 2023-09-30 MED ORDER — METHOCARBAMOL 750 MG PO TABS: 750.0000 mg | ORAL_TABLET | Freq: Four times a day (QID) | ORAL | 0 refills | Status: DC

## 2023-09-30 MED ORDER — SENNOSIDES-DOCUSATE SODIUM 8.6-50 MG PO TABS: 2.0000 | ORAL_TABLET | Freq: Every day | ORAL | 0 refills | Status: AC

## 2023-09-30 MED ORDER — OXYCODONE HCL 5 MG PO TABS: 5.0000 mg | ORAL_TABLET | Freq: Four times a day (QID) | ORAL | 0 refills | Status: DC | PRN

## 2023-09-30 MED ORDER — CEFPODOXIME PROXETIL 200 MG PO TABS
200.0000 mg | ORAL_TABLET | Freq: Two times a day (BID) | ORAL | 0 refills | Status: DC
Start: 2023-09-30 — End: 2023-09-30

## 2023-09-30 MED ORDER — IOHEXOL 9 MG/ML PO SOLN
500.0000 mL | ORAL | Status: AC
  Administered 2023-09-30: 500 mL via ORAL

## 2023-09-30 MED ORDER — ASPIRIN 81 MG PO TBEC
81.0000 mg | DELAYED_RELEASE_TABLET | Freq: Every day | ORAL | Status: DC
  Administered 2023-09-30: 81 mg via ORAL
  Filled 2023-09-30: qty 1

## 2023-09-30 MED ORDER — POLYETHYLENE GLYCOL 3350 17 G PO PACK: 17.0000 g | PACK | Freq: Two times a day (BID) | ORAL | 0 refills | Status: DC

## 2023-09-30 MED ORDER — POLYETHYLENE GLYCOL 3350 17 G PO PACK: 17.0000 g | PACK | Freq: Two times a day (BID) | ORAL | 0 refills | Status: AC

## 2023-09-30 MED ORDER — IOHEXOL 300 MG/ML  SOLN
100.0000 mL | Freq: Once | INTRAMUSCULAR | Status: AC | PRN
  Administered 2023-09-30: 100 mL via INTRAVENOUS

## 2023-09-30 MED ORDER — CEFPODOXIME PROXETIL 200 MG PO TABS: 200.0000 mg | ORAL_TABLET | Freq: Two times a day (BID) | ORAL | 0 refills | Status: AC

## 2023-09-30 MED ORDER — OXYCODONE HCL 5 MG PO TABS: 5.0000 mg | ORAL_TABLET | Freq: Four times a day (QID) | ORAL | 0 refills | Status: AC | PRN

## 2023-09-30 NOTE — Progress Notes (Signed)
 Patient continues to have tachycardia. His lactate, EKG, CT Chest and CT abdomen pelvis have all been negative for a cause. I recommended evaluation by medicine.  This will be ordered by because of his tachycardia.  Patient refused stating he would like to go home.  I explained to him that potential underlying cause could lead to complications.  Patient stated he understood but like to get better rest at home.  Patient was discharged

## 2023-09-30 NOTE — Discharge Instructions (Addendum)
 Post op Robotic prostatectomy   Activity:  You are encouraged to ambulate frequently (about every hour during waking hours) to help prevent blood clots from forming in your legs or lungs.  However, you should not engage in any heavy lifting (> 10-15 lbs), strenuous activity, or straining. Due to your obturator injury it is important to follow the recommendations that PT instructed you to follow   Diet: You should advance your diet as instructed by your physician.  It will be normal to have some bloating, nausea, and abdominal discomfort intermittently.  Prescriptions:  You will be provided a prescription for pain medication to take as needed.  If your pain is not severe enough to require the prescription pain medication, you may take extra strength Tylenol  instead which will have less side effects.  You should also take a prescribed stool softener to avoid straining with bowel movements as the prescription pain medication may constipate you.  Incisions: You may remove your dressing bandages 48 hours after surgery if not removed in the hospital.  You will either have some small staples or special tissue glue at each of the incision sites. Once the bandages are removed (if present), the incisions may stay open to air.  You may start showering (but not soaking or bathing in water ) the 2nd day after surgery and the incisions simply need to be patted dry after the shower.  No additional care is needed.  What to call us  about: You should call the office [phone]) if you develop fever > 101 or develop persistent vomiting, or severe abdominal pain. Activity:  You are encouraged to ambulate frequently (about every hour during waking hours) to help prevent blood clots from forming in your legs or lungs.  However, you should not engage in any heavy lifting (> 10-15 lbs), strenuous activity, or straining.  Foley Catheter Care A soft, flexible tube (Foley catheter) may have been placed in your bladder to drain  urine and fluid. Follow these instructions:  Only a urologist may trouble shoot the catheter, this is for if you go to the emergency room or see another provider.   Taking Care of the Catheter Keep the area where the catheter leaves your body clean.  Attach the catheter to the leg so there is no tension on the catheter.  Keep the drainage bag below the level of the bladder, but keep it OFF the floor.  Do not take long soaking baths. Your caregiver will give instructions about showering.  Wash your hands before touching ANYTHING related to the catheter or bag.  Using mild soap and warm water  on a washcloth:  Clean the area closest to the catheter insertion site using a circular motion around the catheter.  Clean the catheter itself by wiping AWAY from the insertion site for several inches down the tube.  NEVER wipe upward as this could sweep bacteria up into the urethra (tube in your body that normally drains the bladder) and cause infection.  Place a small amount of sterile lubricant at the tip of the penis where the catheter is entering.  Taking Care of the Drainage Bags Two drainage bags may be taken home: a large overnight drainage bag, and a smaller leg bag which fits underneath clothing.  It is okay to wear the overnight bag at any time, but NEVER wear the smaller leg bag at night.  Keep the drainage bag well below the level of your bladder. This prevents backflow of urine into the bladder and allows the  urine to drain freely.  Anchor the tubing to your leg to prevent pulling or tension on the catheter. Use tape or a leg strap provided by the hospital.  Empty the drainage bag when it is 1/2 to 3/4 full. Wash your hands before and after touching the bag.  Periodically check the tubing for kinks to make sure there is no pressure on the tubing which could restrict the flow of urine.  Changing the Drainage Bags Cleanse both ends of the clean bag with alcohol before changing.  Pinch off the  rubber catheter to avoid urine spillage during the disconnection.  Disconnect the dirty bag and connect the clean one.  Empty the dirty bag carefully to avoid a urine spill.  Attach the new bag to the leg with tape or a leg strap.  Cleaning the Drainage Bags Whenever a drainage bag is disconnected, it must be cleaned quickly so it is ready for the next use.  Wash the bag in warm, soapy water .  Rinse the bag thoroughly with warm water .  Soak the bag for 30 minutes in a solution of white vinegar and water  (1 cup vinegar to 1 quart warm water ).  Rinse with warm water .   IT Normal To See Some blood in the urine is normal, as long as you can see your fingers through the catheter tubing (not the bag) this is ok. As long as the urine is not the consistency of tomato paste.  It will be normal to feel the urge to urinate often this is a side effect of the catheter  Some discharge around the catheter where it exits the penis is normal  SEEK MEDICAL CARE IF:  You have chills or night sweats.  You are leaking around your catheter or have problems with your catheter. It is not uncommon to have sporadic leakage around your catheter as a result of bladder spasms. If the leakage stops, there is not much need for concern. If you are uncertain, call your caregiver.  You develop side effects that you think are coming from your medicines.  SEEK IMMEDIATE MEDICAL CARE IF:  You are suddenly unable to urinate. Check to see if there are any kinks in the drainage tubing that may cause this. If you cannot find any kinks, call your caregiver immediately. This is an emergency.  You develop shortness of breath or chest pains.  Bleeding persists or clots develop in your urine.  You have a fever.  You develop pain in your back or over your lower belly (abdomen).  You develop pain or swelling in your legs.  Any problems you are having get worse rather than better.  MAKE SURE YOU:  Understand these instructions.  Will  watch your condition.  Will get help right away if you are not doing well or get worse.

## 2023-09-30 NOTE — Progress Notes (Signed)
 Pt discharged home today per Dr. Shane. Pt's IV sites d/c'd and WDL. Pt's VSS. Pt and wife provided AVS and foley/dressing change supplies for home use. Pt left floor in stable condition accompanied by NT via WC.

## 2023-09-30 NOTE — Discharge Summary (Signed)
 Date of admission: 09/26/2023  Date of discharge: 09/30/2023  Admission diagnosis: Prostate cancer  Discharge diagnosis: Prostate Cancer   Secondary diagnoses:  Patient Active Problem List   Diagnosis Date Noted   Prostate cancer (HCC) 09/26/2023   Sensorineural hearing loss, bilateral 07/25/2023   Tinnitus of both ears 07/25/2023   Pre-op evaluation 05/31/2023   Ulcerative colitis without complications (HCC) 03/02/2022   Type 2 diabetes mellitus with complication, without long-term current use of insulin  (HCC) 11/24/2019   STEMI (ST elevation myocardial infarction) (HCC) 11/23/2019   STEMI involving left anterior descending coronary artery (HCC) 11/23/2019   Coronary artery disease    Hyperlipidemia LDL goal <70    Pure hypercholesterolemia 12/25/2012   Coronary atherosclerosis of native coronary artery 12/25/2012   Iron deficiency anemia, unspecified 12/25/2012   Anemia, unspecified 12/25/2012   Allergic rhinitis 12/25/2012   GERD (gastroesophageal reflux disease) 12/25/2012   Left sided ulcerative (chronic) colitis (HCC) 12/25/2012   Pain in joint, pelvic region and thigh 12/25/2012   Other and unspecified hyperlipidemia 12/25/2012   Other specified conditions influencing health status(V49.89) 12/25/2012   Elevated prostate specific antigen (PSA) 12/25/2012   Extrinsic asthma 12/25/2012   Hypercalcemia 12/25/2012   Mixed hyperlipidemia 12/25/2012   Ankylosing spondylitis (HCC) 12/25/2012    Procedures performed: Procedure(s): PROSTATECTOMY, RADICAL, ROBOT-ASSISTED, LAPAROSCOPIC LYMPHADENECTOMY, PELVIS, ROBOT-ASSISTED  History and Physical: For full details, please see admission history and physical. Briefly, [name] is a 63 y.o. year old patient with Gleason 3+4 PCa admitted for radical prostatectomy.   Hospital Course: Patient tolerated the procedure well.  He was then transferred to the floor after an uneventful PACU stay.  His hospital course was complicated  obturator nerve injury.  Patient was able to see physical therapy postop day 1 he improved rapidly throughout the course of his hospital stay and was able to be discharged by postop day 4.  Patient was ambulating with a walker easily by the time of discharge.  PT evaluated and cleared him for discharge.  Drain output was low postoperative warm fluid creatinine was normal drain was pulled postop.  Postop day 1 the patient was started on heparin .  Postop day 2 the patient started developing tachycardia as well as increasing white count.  CT angio of the chest showed no PE.  After white count continue to worsen patient started on antibiotics.  Patient was never febrile but continued to be tachycardic.  After antibiotics were started  WBC decreased.  Patient's abdominal pain decreased.  Lactate was normal.  EKG was significant for sinus tachycardia.  CT of the abdomen showed no abdominal injury or concerning areas or due to coagulation products for stone surgery.  On postop day 4 patient's abdomen was soft tolerating regular diet but he still have some persistent tachycardia.  I recommended evaluation by medicine for potential if you stated that he would like to go home understanding the risks of potentially discharge prior to this evaluation.  Suspect the patient's rising white count was due to urinary tract infection.  This was due to preoperative positive urine culture concerns in the preoperative antibiotics to cover the urine culture as well as chronic and acute prostatitis on pathology.  Patient was started on cefepime  while inpatient and transition to cefpodoxime  for discharge.  At the time of discharge patient was ambulating passing gas tolerating diet pain was well-controlled and catheter was draining appropriately.  Patient was discharged with catheter.   Laboratory values:  Recent Labs    09/28/23 1753  09/29/23 0708 09/30/23 0437  WBC 18.3* 12.2* 9.5  HGB 11.4* 10.6* 9.4*  HCT 35.6* 32.0*  28.5*   Recent Labs    09/28/23 0547 09/29/23 0708 09/30/23 0437  NA 132* 132* 134*  K 3.6 3.6 3.4*  CL 98 98 100  CO2 24 23 22   GLUCOSE 163* 139* 125*  BUN 10 18 19   CREATININE 0.86 0.86 0.70  CALCIUM  9.1 9.0 8.5*   No results for input(s): LABPT, INR in the last 72 hours. No results for input(s): LABURIN in the last 72 hours. Results for orders placed or performed during the hospital encounter of 11/23/19  SARS Coronavirus 2 by RT PCR (hospital order, performed in [facility] hospital lab) Nasopharyngeal Nasopharyngeal Swab     Status: None   Collection Time: 11/23/19  3:49 PM   Specimen: Nasopharyngeal Swab  Result Value Ref Range Status   SARS Coronavirus 2 NEGATIVE NEGATIVE Final    Comment: (NOTE) SARS-CoV-2 target nucleic acids are NOT DETECTED.  The SARS-CoV-2 RNA is generally detectable in upper and lower respiratory specimens during the acute phase of infection. The lowest concentration of SARS-CoV-2 viral copies this assay can detect is 250 copies / mL. A negative result does not preclude SARS-CoV-2 infection and should not be used as the sole basis for treatment or other patient management decisions.  A negative result may occur with improper specimen collection / handling, submission of specimen other than nasopharyngeal swab, presence of viral mutation(s) within the areas targeted by this assay, and inadequate number of viral copies (<250 copies / mL). A negative result must be combined with clinical observations, patient history, and epidemiological information.  Fact Sheet for Patients:   BoilerBrush.com.cy  Fact Sheet for Healthcare Providers: https://pope.com/  This test is not yet approved or  cleared by the [place]  FDA and has been authorized for detection and/or diagnosis of SARS-CoV-2 by FDA under an Emergency Use Authorization (EUA).  This EUA will remain in effect (meaning this test can  be used) for the duration of the COVID-19 declaration under Section 564(b)(1) of the Act, 21 U.S.C. section 360bbb-3(b)(1), unless the authorization is terminated or revoked sooner.  Performed at [facility], [address] [place], [place] [address]   MRSA PCR Screening     Status: None   Collection Time: 11/24/19  8:31 AM   Specimen: Nasal Mucosa; Nasopharyngeal  Result Value Ref Range Status   MRSA by PCR NEGATIVE NEGATIVE Final    Comment:        The GeneXpert MRSA Assay (FDA approved for NASAL specimens only), is one component of a comprehensive MRSA colonization surveillance program. It is not intended to diagnose MRSA infection nor to guide or monitor treatment for MRSA infections. Performed at [facility], [address] [place], [place] [address]     Disposition: Home  Discharge instruction: The patient was instructed to be ambulatory but told to refrain from heavy lifting, strenuous activity, or driving.  Patient instructed to use walker when going home.  Discharge medications:     Followup:   Follow-up Information     Shane Steffan BROCKS, MD Follow up on 10/04/2023.   Specialty: Urology Why: 10:45 AM Contact information: [address] [place] [place] [address] [phone]

## 2023-09-30 NOTE — Progress Notes (Signed)
 4 Days Post-Op Subjective: Pt doing well wants to go home, pain well controlled, passing gas, ambulating. HR still in the 110s.   Objective: Vital signs in last 24 hours: Temp:  [97.7 F (36.5 C)-99.1 F (37.3 C)] 98.7 F (37.1 C) (07/14 0341) Pulse Rate:  [102-132] 118 (07/14 0650) Resp:  [17-20] 20 (07/14 0341) BP: (115-129)/(71-89) 129/89 (07/14 0341) SpO2:  [94 %-98 %] 98 % (07/14 0650)  Intake/Output from previous day: 07/13 0701 - 07/14 0700 In: 850.7 [P.O.:360; IV Piggyback:490.7] Out: 550 [Urine:550] Intake/Output this shift: No intake/output data recorded.  Physical Exam:  General: Alert and oriented CV: RRR Lungs: Clear Abdomen: Soft, ND, ATTP; inc c/d/I, drain removed GU: catheter in place with periurethral leakage  LE: no pain, Adductor strength improving.   Lab Results: Recent Labs    09/28/23 1753 09/29/23 0708 09/30/23 0437  HGB 11.4* 10.6* 9.4*  HCT 35.6* 32.0* 28.5*   BMET Recent Labs    09/29/23 0708 09/30/23 0437  NA 132* 134*  K 3.6 3.4*  CL 98 100  CO2 23 22  GLUCOSE 139* 125*  BUN 18 19  CREATININE 0.86 0.70  CALCIUM  9.0 8.5*     Studies/Results: CT Angio Chest Pulmonary Embolism (PE) W or WO Contrast Result Date: 09/28/2023 CLINICAL DATA:  High probability for pulmonary embolism. Two days postop from radical prostatectomy for prostate carcinoma. * Tracking Code: BO * EXAM: CT ANGIOGRAPHY CHEST WITH CONTRAST TECHNIQUE: Multidetector CT imaging of the chest was performed using the standard protocol during bolus administration of intravenous contrast. Multiplanar CT image reconstructions and MIPs were obtained to evaluate the vascular anatomy. RADIATION DOSE REDUCTION: This exam was performed according to the departmental dose-optimization program which includes automated exposure control, adjustment of the mA and/or kV according to patient size and/or use of iterative reconstruction technique. CONTRAST:  75mL OMNIPAQUE  IOHEXOL  350 MG/ML  SOLN COMPARISON:  None available FINDINGS: Cardiovascular: Satisfactory opacification of pulmonary arteries noted, although evaluation of lower lobe pulmonary arteries is limited by respiratory motion artifact. No pulmonary emboli identified. No evidence of thoracic aortic dissection or aneurysm. Mediastinum/Nodes: No masses or pathologically enlarged lymph nodes identified. Lungs/Pleura: Right lower lobe subsegmental atelectasis noted. No evidence of pulmonary consolidation or pleural effusion. Upper abdomen: Free intraperitoneal air noted, presumably postop in etiology. Musculoskeletal: No suspicious bone lesions identified. Review of the MIP images confirms the above findings. IMPRESSION: No evidence of pulmonary embolism. Right lower lobe subsegmental atelectasis. Free intraperitoneal air, presumably postop in etiology. Electronically Signed   By: Norleen DELENA Kil M.D.   On: 09/28/2023 11:14    Assessment/Plan: 63 year old male with asthma at baseline status post robotic radical prostatectomy and bilateral pelvic lymph node dissection on 7/10, complicated by left obturator nerve cautery injury.  Tachycardic and short of breath POD#2 and CT PE was negative.  Persistent tachycardia, pain control improved. WBC increasing on 7/12 improved with abx.   # Prostatectomy - carb modified diet - passing gas - ambulating  - pain well controlled  - continue heparin    # Tachycardia - remains in the 100s-110s  - on beta blocker - CT chest angio on 7/12 shows no PE - lactate ordered today   # Leukocytosis  - improved on cefepime   - abdominal pain improved.   # Left obturator nerve injury  - seen by PT will confirm if he is ok to go home.  - ambulating     LOS: 2 days   Jackey Pea MD 09/30/2023, 7:06 AM [facility]

## 2023-09-30 NOTE — Progress Notes (Signed)
   09/30/23 0830  Assess: MEWS Score  Temp 98.2 F (36.8 C)  BP 120/77  MAP (mmHg) 90  Pulse Rate (!) 125  Resp 18  Level of Consciousness Alert  SpO2 96 %  O2 Device Room Air  Assess: MEWS Score  MEWS Temp 0  MEWS Systolic 0  MEWS Pulse 2  MEWS RR 0  MEWS LOC 0  MEWS Score 2  MEWS Score Color Yellow  Assess: if the MEWS score is Yellow or Red  Were vital signs accurate and taken at a resting state? Yes  Does the patient meet 2 or more of the SIRS criteria? No  Does the patient have a confirmed or suspected source of infection? No  MEWS guidelines implemented  No, previously yellow, continue vital signs every 4 hours  Notify: Charge Nurse/RN  Name of Charge Nurse/RN Notified Lonell Alstrom, RN  Assess: SIRS CRITERIA  SIRS Temperature  0  SIRS Respirations  0  SIRS Pulse 1  SIRS WBC 0  SIRS Score Sum  1

## 2023-09-30 NOTE — Progress Notes (Signed)
 Patient stated to RN he has RW at home. Does not need another one at discharge.

## 2023-10-01 ENCOUNTER — Telehealth: Payer: Self-pay

## 2023-10-01 NOTE — Transitions of Care (Post Inpatient/ED Visit) (Signed)
   10/01/2023  Name: [name] MRN: [id number] DOB: Jan 06, 1961  Today's TOC FU Call Status: Today's TOC FU Call Status:: Unsuccessful Call (1st Attempt) Unsuccessful Call (1st Attempt) Date: 10/01/23  Attempted to reach the patient regarding the most recent Inpatient/ED visit. Left a HIPAA approved voicemail message to phone number provided in demographics per DPR.    Follow Up Plan: Additional outreach attempts will be made to reach the patient to complete the Transitions of Care (Post Inpatient/ED visit) call.   Richerd Fish, RN, BSN, CCM [facility], [facility] Health RN Care Manager Direct Dial: [phone]

## 2023-10-02 ENCOUNTER — Telehealth: Payer: Self-pay

## 2023-10-02 NOTE — Transitions of Care (Post Inpatient/ED Visit) (Signed)
   10/02/2023  Name: [name] MRN: [id number] DOB: 1960-06-07  Today's TOC FU Call Status: Today's TOC FU Call Status:: Unsuccessful Call (2nd Attempt) Unsuccessful Call (2nd Attempt) Date: 10/02/23  Attempted to reach the patient regarding the most recent Inpatient/ED visit. Left a HIPAA approved voicemail message to phone number provided in demographics per DPR.    Follow Up Plan: Additional outreach attempts will be made to reach the patient to complete the Transitions of Care (Post Inpatient/ED visit) call.   Richerd Fish, RN, BSN, CCM [facility], [facility] Health RN Care Manager Direct Dial: [phone]

## 2023-10-02 NOTE — Transitions of Care (Post Inpatient/ED Visit) (Signed)
 10/02/2023  Patient ID: [name], male   DOB: [birth year], 63 y.o.   MRN: [id number]  Received voicemail message from patient that call attempted was received on voicemail and doing well.  Will attempt another call today to reach out and offer TOC follow up program.  Richerd Fish, RN, BSN, CCM [place]  [facility], [facility] Health RN Care Manager Direct Dial: [phone]

## 2023-10-03 ENCOUNTER — Telehealth: Payer: Self-pay

## 2023-10-03 NOTE — Transitions of Care (Post Inpatient/ED Visit) (Signed)
 10/03/2023  Name: [name] MRN: [id number] DOB: 04/13/1960  Today's TOC FU Call Status: Today's TOC FU Call Status:: Successful TOC FU Call Completed TOC FU Call Complete Date: 10/03/23 Patient's Name and Date of Birth confirmed.  Transition Care Management Follow-up Telephone Call Date of Discharge: 10/03/23 Discharge Facility: [name]) Type of Discharge: Inpatient Admission Primary Inpatient Discharge Diagnosis:: Prostatectomy - Prostate CA How have you been since you were released from the hospital?: Better (got a good night sleep in) Any questions or concerns?: No  Items Reviewed: Did you receive and understand the discharge instructions provided?: Yes Medications obtained,verified, and reconciled?: Yes (Medications Reviewed) Any new allergies since your discharge?: No Dietary orders reviewed?: NA Do you have support at home?: Yes People in Home [RPT]: spouse Name of Support/Comfort Primary Source: Nathanel  Medications Reviewed Today: Medications Reviewed Today     Reviewed by Eilleen Richerd GRADE, RN (Registered Nurse) on 10/03/23 at 1025  Med List Status: <None>   Medication Order Taking? Sig Documenting Provider Last Dose Status Informant  albuterol  (VENTOLIN  HFA) 108 (90 Base) MCG/ACT inhaler [id number] Yes INHALE 2 PUFFS INTO THE LUNGS EVERY 6 HOURS AS NEEDED FOR WHEEZING Fargo, Amy E, NP  Active Self  aspirin  EC 81 MG tablet [id number] Yes Take 1 tablet (81 mg total) by mouth daily. Swallow whole. Shane Steffan BROCKS, MD  Active   cefpodoxime  (VANTIN ) 200 MG tablet [id number] Yes Take 1 tablet (200 mg total) by mouth 2 (two) times daily for 20 doses. Shane Steffan BROCKS, MD  Active   cyclobenzaprine  (FLEXERIL ) 5 MG tablet [id number] Yes Take 1 tablet (5 mg total) by mouth at bedtime. Gil Greig BRAVO, NP  Active Self  Ferrous Gluconate (IRON 27 PO) [id number] Yes Take 27 mg by mouth daily. [provider]  Active Self           Med Note CHRISTIE ALYSON Kitchens Aug 19, 2023 11:35 AM)    gabapentin  (NEURONTIN ) 100 MG capsule [id number] Yes Take 200 mg by mouth at bedtime. [provider]  Active Self           Med Note CHRISTIE ALYSON Kitchens Aug 19, 2023 11:35 AM)    hyoscyamine  (ANASPAZ ) 0.125 MG TBDP disintergrating tablet [id number] Yes Place 1 tablet (0.125 mg total) under the tongue every 6 (six) hours as needed for up to 20 doses. Shane Steffan BROCKS, MD  Active Self  icosapent  Ethyl (VASCEPA ) 1 g capsule [id number] Yes Take 2 capsules (2 g total) by mouth 2 (two) times daily. Gil Greig E, NP  Active Self  lisinopril  (ZESTRIL ) 2.5 MG tablet [id number] Yes Take 1 tablet (2.5 mg total) by mouth daily. Gil Greig BRAVO, NP  Active Self           Med Note CHRISTIE ALYSON Kitchens Aug 19, 2023 11:36 AM)    loratadine  (CLARITIN ) 10 MG tablet [id number] Yes Take 10 mg by mouth daily. [provider]  Active Self           Med Note CHRISTIE ALYSON Kitchens Aug 19, 2023 11:36 AM)    metFORMIN  (GLUCOPHAGE -XR) 500 MG 24 hr tablet [id number] Yes Take 1 tablet (500 mg total) by mouth 2 (two) times daily with a meal. Fargo, Amy E, NP  Active Self  methocarbamol  (ROBAXIN ) 750 MG tablet [id number] Yes Take 1 tablet (750 mg total) by mouth 4 (four) times daily for 20 doses. Shane Steffan BROCKS, MD  Active   metoprolol  succinate (TOPROL -XL) 50 MG 24 hr tablet [id number] Yes TAKE 1 TABLET(50 MG) BY MOUTH DAILY WITH OR IMMEDIATELY FOLLOWING A MEAL Fargo, Amy E, NP  Active Self           Med Note CHRISTIE ALYSON Kitchens Aug 19, 2023 11:37 AM)    Multiple Vitamins-Minerals (CENTRUM CARDIO) TABS [id number] Yes Take 1 tablet by mouth 2 (two) times daily. [provider]  Active Self           Med Note CHRISTIE ALYSON Kitchens Aug 19, 2023 11:37 AM)    nitroGLYCERIN  (NITROSTAT ) 0.4 MG SL tablet [id number] Yes Place 1 tablet (0.4 mg total) under the tongue every 5 (five) minutes as needed for chest pain. Dann Candyce RAMAN, MD  Active Self            Med Note KERRIN, MELISSA R   Fri Sep 13, 2023  1:44 PM)    oxyCODONE  (OXY IR/ROXICODONE ) 5 MG immediate release tablet [id number] Yes Take 1 tablet (5 mg total) by mouth every 6 (six) hours as needed for up to 12 doses for moderate pain (pain score 4-6). Shane Steffan BROCKS, MD  Active   pantoprazole  (PROTONIX ) 40 MG tablet [id number] Yes TAKE 1 TABLET(40 MG) BY MOUTH DAILY Fargo, Amy E, NP  Active Self           Med Note CHRISTIE ALYSON Kitchens Aug 19, 2023 11:37 AM)    polyethylene glycol (MIRALAX  / GLYCOLAX ) 17 g packet [id number] Yes Take 17 g by mouth 2 (two) times daily. Shane Steffan BROCKS, MD  Active   REPATHA  SURECLICK 140 MG/ML EMMANUEL [id number] Yes ADMINISTER 1 ML UNDER THE SKIN EVERY 14 DAYS Jeffrie Oneil BROCKS, MD  Active Self  senna-docusate (SENOKOT-S) 8.6-50 MG tablet [id number] Yes Take 2 tablets by mouth at bedtime for 10 doses. Shane Steffan BROCKS, MD  Active   sulfaSALAzine  (AZULFIDINE ) 500 MG EC tablet [id number] Yes TAKE 2 TABLETS BY MOUTH TWICE DAILY Federico Rosario BROCKS, MD  Active Self  tamsulosin  (FLOMAX ) 0.4 MG CAPS capsule [id number] Yes Take 1 capsule (0.4 mg total) by mouth daily after supper. Shane Steffan BROCKS, MD  Active Self  theophylline  (THEODUR) 300 MG 12 hr tablet [id number] Yes TAKE 1 TABLET BY MOUTH TWICE DAILY Fargo, Amy E, NP  Active Self  vedolizumab  (ENTYVIO ) 300 MG injection [id number] Yes Inject 300 mg into the vein every 8 (eight) weeks. [provider]  Active Self            Home Care and Equipment/Supplies: Were Home Health Services Ordered?: Yes [facility] was ordered and they left a message a message and I didn't call back, I don't need anyone to come out to the house, I'm fine) Name of Home Health Agency:: [facility] ordered Has Agency set up a time to come to your home?:  (They left a voicemail I don't need anything at the house) Any new equipment or medical supplies ordered?: NA  Functional Questionnaire: Do you need assistance with  bathing/showering or dressing?: No Do you need assistance with meal preparation?: No Do you need assistance with eating?: No Do you have difficulty maintaining continence: No Do you need assistance with getting out of bed/getting out of a chair/moving?: No Do you have difficulty managing or taking your medications?: No  Follow up appointments reviewed: PCP Follow-up appointment confirmed?: No (10/03/22 Offered to assist with appointment - declines states I will make it myself) Specialist Hospital Follow-up  appointment confirmed?: Yes Date of Specialist follow-up appointment?: 10/04/23 Follow-Up Specialty Provider:: Urology - getting cathter reoved Do you need transportation to your follow-up appointment?: No Do you understand care options if your condition(s) worsen?: Yes-patient verbalized understanding  SDOH Interventions Today    Flowsheet Row Most Recent Value  SDOH Interventions   Food Insecurity Interventions Intervention Not Indicated  Housing Interventions Intervention Not Indicated  Transportation Interventions Intervention Not Indicated  Utilities Interventions Intervention Not Indicated    Goals Addressed             This Visit's Progress    VBCI Transitions of Care (TOC) Care Plan       Problems:  Recent Hospitalization for treatment of Prostate Cancer s/p Prostatectomy Care for post op needs regarding pain, catheter care, and s/s of infections  Goal:  Over the next 30 days, the patient will not experience hospital readmission  Interventions:  Transitions of Care: Doctor Visits  - discussed the importance of doctor visits Communication with PCP for medication refill or Urologist if pain medication refill is needed re: post op home management needs Contacted provider for patient needs for s/s of infection such as fever, uncontrollable pain  Patient Self Care Activities:  Attend all scheduled provider appointments Call pharmacy for medication refills 3-7 days  in advance of running out of medications Call provider office for new concerns or questions  Notify RN Care Manager of [facility] call rescheduling needs Participate in Transition of Care Program/Attend [facility] scheduled calls  Plan:  Telephone follow up appointment with care management team member scheduled for:  10/10/23 at 1300 (1 pm) The care management team will reach out to the patient again over the next 5- 10 business days.        Richerd Fish, RN, BSN, CCM [facility], [facility] Health RN Care Manager Direct Dial: [phone]

## 2023-10-03 NOTE — Patient Instructions (Signed)
 Visit Information  Thank you for taking time to visit with me today. Please don't hesitate to contact me if I can be of assistance to you before our next scheduled telephone appointment.  Our next appointment is by telephone on 10/10/23 with Shona Prow, RN at 1300 (1 PM)  Following is a copy of your care plan:   Goals Addressed             This Visit's Progress    VBCI Transitions of Care (TOC) Care Plan       Problems:  Recent Hospitalization for treatment of Prostate Cancer s/p Prostatectomy Care for post op needs regarding pain, catheter care, and s/s of infections  Goal:  Over the next 30 days, the patient will not experience hospital readmission  Interventions:  Transitions of Care: Doctor Visits  - discussed the importance of doctor visits Communication with PCP for medication refill or Urologist if pain medication refill is needed re: post op home management needs Contacted provider for patient needs for s/s of infection such as fever, uncontrollable pain  Patient Self Care Activities:  Attend all scheduled provider appointments Call pharmacy for medication refills 3-7 days in advance of running out of medications Call provider office for new concerns or questions  Notify RN Care Manager of [facility] call rescheduling needs Participate in Transition of Care Program/Attend [facility] scheduled calls  Plan:  Telephone follow up appointment with care management team member scheduled for:  10/10/23 at 1300 (1 pm) The care management team will reach out to the patient again over the next 5- 10 business days.        Patient verbalizes understanding of instructions and care plan provided today and agrees to view in MyChart. Active MyChart status and patient understanding of how to access instructions and care plan via MyChart confirmed with patient.     Telephone follow up appointment with care management team member scheduled for: The care management team will reach out to the  patient again over the next 5-10 business days.  Follow up with provider re: Patient states he will make the post hospital follow up with PCP  Please call the care guide team at [phone] if you need to cancel or reschedule your appointment.   Please call the [place]  National Suicide Prevention Lifeline: 585-541-0693 or TTY: (757)525-9694 TTY 929-049-5945) to talk to a trained counselor call 1-800-273-TALK (toll free, 24 hour hotline) if you are experiencing a Mental Health or Behavioral Health Crisis or need someone to talk to.  Richerd Fish, RN, BSN, CCM [facility], [facility] Health RN Care Manager Direct Dial: [phone]

## 2023-10-04 ENCOUNTER — Ambulatory Visit

## 2023-10-04 DIAGNOSIS — C61 Malignant neoplasm of prostate: Secondary | ICD-10-CM | POA: Diagnosis not present

## 2023-10-04 LAB — URINE CULTURE: Culture: 30000 — AB

## 2023-10-07 ENCOUNTER — Telehealth: Payer: Self-pay

## 2023-10-07 ENCOUNTER — Encounter: Payer: Self-pay | Admitting: Internal Medicine

## 2023-10-07 DIAGNOSIS — N410 Acute prostatitis: Secondary | ICD-10-CM

## 2023-10-07 NOTE — Telephone Encounter (Signed)
 First dose to be done in home 7/24 per Holley Herring, RN with [employer].   Ohm Dentler, BSN, RN

## 2023-10-07 NOTE — Progress Notes (Addendum)
 Patient requested to be seen by id clinic by urology  Recent prostate surgery Sx now concerned for prostatitis  PsA sensitive to pip-tazo and gent    Discussed with urology who will help arrange a PICC line   Opat arrangement will initiate as follow. I have asked our [facility] staffs and pharmacist to help as well  Medication to be given via PICC line PICC care per [facility] team  Meds Pipperacillin-tazobactam 13.5 gram daily continuous infusion  Start: Whenever PICC placed  End date/duration 3 weeks from start   Labs CBC, CMP, crp weekly   Faxed to [facility] clinic

## 2023-10-07 NOTE — Telephone Encounter (Signed)
 Love it!

## 2023-10-07 NOTE — Telephone Encounter (Signed)
 Spoke with patient, PICC placement scheduled for 7/23 at 9 AM at [facility]. Patient aware.  OPAT orders sent to Holley Herring, RN with [employer]. Okay to pull PICC after last dose per Dr. Overton.   Waiting to hear if first dose can be done in home or if patient will need short stay.   Scheduled for ID follow up with Dr. Dea on 7/29.  Patric Vanpelt, BSN, RN

## 2023-10-09 ENCOUNTER — Other Ambulatory Visit: Payer: Self-pay | Admitting: Internal Medicine

## 2023-10-09 ENCOUNTER — Other Ambulatory Visit: Payer: Self-pay | Admitting: Orthopedic Surgery

## 2023-10-09 ENCOUNTER — Ambulatory Visit (HOSPITAL_COMMUNITY)
Admission: RE | Admit: 2023-10-09 | Discharge: 2023-10-09 | Disposition: A | Discharge status: Home / Self Care | Source: Ambulatory Visit | Attending: Internal Medicine | Admitting: Internal Medicine

## 2023-10-09 DIAGNOSIS — N419 Inflammatory disease of prostate, unspecified: Secondary | ICD-10-CM | POA: Diagnosis not present

## 2023-10-09 DIAGNOSIS — N410 Acute prostatitis: Secondary | ICD-10-CM | POA: Diagnosis not present

## 2023-10-09 MED ORDER — LIDOCAINE HCL 1 % IJ SOLN
20.0000 mL | Freq: Once | INTRAMUSCULAR | Status: AC
  Administered 2023-10-09: 5 mL via INTRADERMAL

## 2023-10-09 MED ORDER — LIDOCAINE HCL 1 % IJ SOLN
INTRAMUSCULAR | Status: AC
  Filled 2023-10-09: qty 20

## 2023-10-09 MED ORDER — HEPARIN SOD (PORK) LOCK FLUSH 100 UNIT/ML IV SOLN
500.0000 [IU] | Freq: Once | INTRAVENOUS | Status: AC
  Administered 2023-10-09: 500 [IU] via INTRAVENOUS

## 2023-10-09 NOTE — Procedures (Signed)
 PROCEDURE SUMMARY:  Successful placement of single lumen PICC line to right basillic vein. Length 41cm Tip at lower SVC/RA No complications PICC capped Ready for use. EBL = trace  Please see full dictation in Imaging section for details.   Lavanda JAYSON Jurist PA-C 10/09/2023 9:49 AM

## 2023-10-10 ENCOUNTER — Telehealth: Payer: Self-pay

## 2023-10-10 DIAGNOSIS — N410 Acute prostatitis: Secondary | ICD-10-CM | POA: Diagnosis not present

## 2023-10-11 ENCOUNTER — Telehealth: Payer: Self-pay

## 2023-10-11 DIAGNOSIS — N410 Acute prostatitis: Secondary | ICD-10-CM | POA: Diagnosis not present

## 2023-10-11 NOTE — Patient Instructions (Signed)
 Visit Information  Thank you for taking time to visit with me today. Please don't hesitate to contact me if I can be of assistance to you before our next scheduled telephone appointment.  Our next appointment is by telephone on 10/18/23 at 1pm  Following is a copy of your care plan:   Goals Addressed             This Visit's Progress    VBCI Transitions of Care (TOC) Care Plan       Problems:  Recent Hospitalization for treatment of Prostate Cancer s/p Prostatectomy Care for post op needs regarding pain, catheter care, and s/s of infections PICC line placed 10/09/23 with Pipperacillin-tazobactam 13.5 gram daily continuous infusion for 3 weeks - [facility] is following and patient's wife is administering medication without difficulty per patient report Patient declined 10/11/23 medication review stating he understands his medications  Goal:  Over the next 30 days, the patient will not experience hospital readmission  Interventions:  Transitions of Care: Doctor Visits  - discussed the importance of doctor visits Communication with PCP for medication refill or Urologist if pain medication refill is needed re: post op home management needs Contacted provider for patient needs for s/s of infection such as fever, uncontrollable pain Encouraged patient to talk about his feelings after patient reported frustrations with hospitalization and PICC line - patient states he has support from friends who have had same health issue with prostate and wife and denied need to talk with anyone else  Patient Self Care Activities:  Attend all scheduled provider appointments Call pharmacy for medication refills 3-7 days in advance of running out of medications Call provider office for new concerns or questions - discuss plan regarding Plavix  that was stopped due to bleeding - patient reports history of MI 4 years ago with stent placement Notify RN Care Manager of TOC call rescheduling needs Participate in  Transition of Care Program/Attend TOC scheduled calls Take medications as prescribed - patient declined medication review 10/11/23 but stated he has no issues getting his medications and denied any new medication allergies   Plan:  Telephone follow up appointment with care management team member scheduled for:  10/18/23 1pm) The patient has been provided with contact information for the care management team and has been advised to call with any health related questions or concerns.         Patient verbalizes understanding of instructions and care plan provided today and agrees to view in MyChart. Active MyChart status and patient understanding of how to access instructions and care plan via MyChart confirmed with patient.     Telephone follow up appointment with care management team member scheduled for: 10/18/23 1pm The patient has been provided with contact information for the care management team and has been advised to call with any health related questions or concerns.   Please call the care guide team at [phone] if you need to cancel or reschedule your appointment.   Please call the Suicide and Crisis Lifeline: 988 call the [place]  National Suicide Prevention Lifeline: 507-856-3043 or TTY: 251 223 7509 TTY 867-196-2309) to talk to a trained counselor call 1-800-273-TALK (toll free, 24 hour hotline) call 911 if you are experiencing a Mental Health or Behavioral Health Crisis or need someone to talk to.  Shona Prow RN, CCM [place]  VBCI-Population Health RN Care Manager [phone]

## 2023-10-11 NOTE — Transitions of Care (Post Inpatient/ED Visit) (Signed)
 Transition of Care week 2  Visit Note  10/11/2023  Name: [name] MRN: [id number]          DOB: [birth year]  Situation: Patient enrolled in [facility] 30-day program. Visit completed with patient by telephone.   Background:   Initial Transition Care Management Follow-up Telephone Call    Past Medical History:  Diagnosis Date   Anemia    Ankylosing spondylitis (HCC)    Arthritis    Asthma    SINCE AGE 63   Colitis, ulcerative (HCC)    Coronary artery disease    Bare metal LAD stent 10/08   Coronary atherosclerosis of native coronary artery 12/25/2012   Depression    Diabetes mellitus without complication [facility])    ED (erectile dysfunction)    Elevated prostate specific antigen (PSA) 12/25/2012   Esophageal reflux 12/25/2012   Extrinsic asthma, unspecified 12/25/2012   Fibromyalgia    GERD (gastroesophageal reflux disease)    History of heart attack    Hypercalcemia 12/25/2012   Hyperlipidemia    Hypertension    Left sided ulcerative (chronic) colitis (HCC) 12/25/2012   Mixed hyperlipidemia 12/25/2012   Myocardial infarction (HCC)    Pneumonia    as a child   Prostate cancer (HCC) 06/06/2023   Pure hypercholesterolemia 12/25/2012   Skin cancer of eyelid    TMJ (dislocation of temporomandibular joint)     Assessment: Patient Reported Symptoms: Cognitive Cognitive Status: Alert and oriented to person, place, and time, No symptoms reported, Normal speech and language skills      Neurological Neurological Review of Symptoms: No symptoms reported Neurological Self-Management Outcome: 4 (good)  HEENT HEENT Symptoms Reported: Change or loss of hearing HEENT Comment: patient states he has hearing aids that he feels does not work - states he had his last hearing test in the spring - plans to see new audiologist    Cardiovascular Cardiovascular Symptoms Reported: No symptoms reported Weight: 158 lb (71.7 kg)  Respiratory Respiratory Symptoms Reported: No symptoms  reported Other Respiratory Symptoms: patient states his asthma since 8 years is managed well with medication Respiratory Self-Management Outcome: 4 (good)  Endocrine Endocrine Symptoms Reported: Not assessed (Patient had to end call)    Gastrointestinal Gastrointestinal Symptoms Reported: Not assessed (patient had to end call)      Genitourinary Genitourinary Symptoms Reported: No symptoms reported Additional Genitourinary Details: Patient reported catheter was not removed previously and he is returning to Urology today 10/11/23 and hopes to have foley catheter removed today Genitourinary Management Strategies: Catheter, indwelling  Integumentary Integumentary Symptoms Reported: Not assessed (unable to assess related to patient needing to end the call)    Musculoskeletal Musculoskelatal Symptoms Reviewed: Not assessed Additional Musculoskeletal Details: related patient needing to end call        Psychosocial Psychosocial Symptoms Reported: Not assessed Additional Psychological Details: patient needed to end call - during call patient did report feelings of frustation over having to stay in the hospital longer than he thought he'd be there and now with PICC line - TOC RN dicussed patient's feelings and he states  he has support with friends and wife and denied need to speak with anyone else         There were no vitals filed for this visit.  Medications Reviewed Today   Medications were not reviewed in this encounter     Recommendation:   Continue Current Plan of Care  Follow Up Plan:   Telephone follow up appointment date/time:  10/18/23 1pm  Shona Prow RN, CCM [place]  VBCI-Population Health RN Care Manager [phone]

## 2023-10-12 ENCOUNTER — Other Ambulatory Visit: Payer: Self-pay | Admitting: Internal Medicine

## 2023-10-12 DIAGNOSIS — K5180 Other ulcerative colitis without complications: Secondary | ICD-10-CM

## 2023-10-12 DIAGNOSIS — N410 Acute prostatitis: Secondary | ICD-10-CM | POA: Diagnosis not present

## 2023-10-13 DIAGNOSIS — N410 Acute prostatitis: Secondary | ICD-10-CM | POA: Diagnosis not present

## 2023-10-14 ENCOUNTER — Encounter: Payer: Self-pay | Admitting: Orthopedic Surgery

## 2023-10-14 DIAGNOSIS — N410 Acute prostatitis: Secondary | ICD-10-CM | POA: Diagnosis not present

## 2023-10-15 ENCOUNTER — Ambulatory Visit: Admitting: Infectious Diseases

## 2023-10-15 ENCOUNTER — Telehealth: Payer: Self-pay

## 2023-10-15 DIAGNOSIS — N410 Acute prostatitis: Secondary | ICD-10-CM | POA: Diagnosis not present

## 2023-10-15 NOTE — Telephone Encounter (Signed)
 Attempted to call patient regarding appointment scheduled for today. Not able to reach him at this time. Left voicemail requesting call back. Pt currently on Pipperacillin-tazobactam 13.5 gram daily  for three weeks starting 7/25. Will need to reschedule appt before end date. Lorenda CHRISTELLA Code, RMA

## 2023-10-16 DIAGNOSIS — N410 Acute prostatitis: Secondary | ICD-10-CM | POA: Diagnosis not present

## 2023-10-17 DIAGNOSIS — N410 Acute prostatitis: Secondary | ICD-10-CM | POA: Diagnosis not present

## 2023-10-18 ENCOUNTER — Other Ambulatory Visit: Payer: Self-pay | Admitting: Orthopedic Surgery

## 2023-10-18 ENCOUNTER — Other Ambulatory Visit: Payer: Self-pay

## 2023-10-18 DIAGNOSIS — E1142 Type 2 diabetes mellitus with diabetic polyneuropathy: Secondary | ICD-10-CM

## 2023-10-18 DIAGNOSIS — N410 Acute prostatitis: Secondary | ICD-10-CM | POA: Diagnosis not present

## 2023-10-18 NOTE — Patient Instructions (Signed)
 Visit Information  Thank you for taking time to visit with me today. Please don't hesitate to contact me if I can be of assistance to you before our next scheduled telephone appointment.  Our next appointment is by telephone on 10/24/23 at 4pm  Following is a copy of your care plan:   Goals Addressed             This Visit's Progress    VBCI Transitions of Care (TOC) Care Plan       Problems:  Recent Hospitalization for treatment of Prostate Cancer s/p Prostatectomy Care for post op needs regarding pain, catheter care, and s/s of infections PICC line placed 10/09/23 with Pipperacillin-tazobactam 13.5 gram daily continuous infusion for 3 weeks - [facility] is following and patient's wife is administering medication without difficulty per patient report Patient declined 10/11/23 medication review stating he understands his medications - Patient also declined med review 10/18/23 - Patient states he understands his medications, is taking them the way he's supposed to, and does not feel he needs to review the medications - TOC RN advised patient we will make the calls meaningful to him and talk about what he needs to discuss or needs TOC RN to help him address  Goal:  Over the next 30 days, the patient will not experience hospital readmission  Interventions:  Transitions of Care: Doctor Visits  - discussed the importance of doctor visits Communication with PCP for medication refill or Urologist if pain medication refill is needed re: post op home management needs Contacted provider for patient needs for s/s of infection such as fever, uncontrollable pain Encouraged patient to talk about his feelings after patient reported frustrations with hospitalization and PICC line - patient states he has support from friends who have had same health issue with prostate and wife and denied need to talk with anyone else - 10/18/23 Update - Patient confirms foley catheter was removed 10/11/23 and now reports  frustration with urinary incontinence-TOC RN allowed patient to discuss his feelings - encouraged patient to remember how far he has come with no longer having foley catheter and no longer having blood in the urine - patient agreed  Patient Self Care Activities:  Attend all scheduled provider appointments Call pharmacy for medication refills 3-7 days in advance of running out of medications Call provider office for new concerns or questions - discuss plan regarding Plavix  that was stopped due to bleeding - patient reports history of MI 4 years ago with stent placement Notify RN Care Manager of TOC call rescheduling needs Participate in Transition of Care Program/Attend TOC scheduled calls Take medications as prescribed - patient declined medication review 10/11/23 but stated he has no issues getting his medications and denied any new medication allergies   Plan:  Telephone follow up appointment with care management team member scheduled for:  10/24/23 4pm The patient has been provided with contact information for the care management team and has been advised to call with any health related questions or concerns.         Patient verbalizes understanding of instructions and care plan provided today and agrees to view in MyChart. Active MyChart status and patient understanding of how to access instructions and care plan via MyChart confirmed with patient.     Telephone follow up appointment with care management team member scheduled for: 10/24/23 4pm The patient has been provided with contact information for the care management team and has been advised to call with any health related questions or  concerns.   Please call the care guide team at [phone] if you need to cancel or reschedule your appointment.   Please call the Suicide and Crisis Lifeline: 988 call 911 if you are experiencing a Mental Health or Behavioral Health Crisis or need someone to talk to.  Shona Prow RN, CCM [facility]  VBCI-Population Health RN Care Manager [phone]

## 2023-10-18 NOTE — Transitions of Care (Post Inpatient/ED Visit) (Signed)
  Transition of Care week 3  Visit Note  10/18/2023  Name: [name] MRN: [id number]          DOB: 1960/12/03  Situation: Patient enrolled in [facility] 30-day program. Visit completed with patient by telephone.   Background: Admit/Discharge Date:  7/10 - 7/14 [name]   Primary Diagnosis: Prostate cancer diagnosed 06/11/23 had blood in urine 10 days prior  - Gleason 3+4 PCa admitted for radical prostatectomy.  Initial Transition Care Management Follow-up Telephone Call    Past Medical History:  Diagnosis Date   Anemia    Ankylosing spondylitis (HCC)    Arthritis    Asthma    SINCE AGE 6   Colitis, ulcerative (HCC)    Coronary artery disease    Bare metal LAD stent 10/08   Coronary atherosclerosis of native coronary artery 12/25/2012   Depression    Diabetes mellitus without complication [facility])    ED (erectile dysfunction)    Elevated prostate specific antigen (PSA) 12/25/2012   Esophageal reflux 12/25/2012   Extrinsic asthma, unspecified 12/25/2012   Fibromyalgia    GERD (gastroesophageal reflux disease)    History of heart attack    Hypercalcemia 12/25/2012   Hyperlipidemia    Hypertension    Left sided ulcerative (chronic) colitis (HCC) 12/25/2012   Mixed hyperlipidemia 12/25/2012   Myocardial infarction [facility])    Pneumonia    as a child   Prostate cancer (HCC) 06/06/2023   Pure hypercholesterolemia 12/25/2012   Skin cancer of eyelid    TMJ (dislocation of temporomandibular joint)     Assessment: Patient Reported Symptoms: Cognitive Cognitive Status: No symptoms reported, Alert and oriented to person, place, and time, Normal speech and language skills      Neurological Neurological Review of Symptoms: No symptoms reported    HEENT   HEENT Comment: patient states this is unchanged    Cardiovascular      Respiratory      Endocrine Endocrine Symptoms Reported: No symptoms reported    Gastrointestinal Gastrointestinal Symptoms Reported: No symptoms  reported      Genitourinary Genitourinary Symptoms Reported: Incontinence Additional Genitourinary Details: Patient reports foley catheter was removed 10/11/23 and is having incontinence and is wearing briefs and voiced his frustation - patient denied any blood in urine - TOC RN listed to patient and then offered for him to look back and see the good things that have happened. No longer with blood and no longer with catheter - continue to look forward to continued improvement    Integumentary Integumentary Symptoms Reported: No symptoms reported    Musculoskeletal          Psychosocial           There were no vitals filed for this visit.  Patient declined medication review. TOC RN did add the IV antibiotic to medication list   Recommendation:   Continue Current Plan of Care  Follow Up Plan:   Telephone follow up appointment date/time:  10/24/23 4pm  Shona Prow RN, CCM [place]  VBCI-Population Health RN Care Manager [phone]

## 2023-10-19 DIAGNOSIS — N410 Acute prostatitis: Secondary | ICD-10-CM | POA: Diagnosis not present

## 2023-10-20 DIAGNOSIS — N410 Acute prostatitis: Secondary | ICD-10-CM | POA: Diagnosis not present

## 2023-10-21 DIAGNOSIS — N410 Acute prostatitis: Secondary | ICD-10-CM | POA: Diagnosis not present

## 2023-10-22 DIAGNOSIS — N410 Acute prostatitis: Secondary | ICD-10-CM | POA: Diagnosis not present

## 2023-10-23 DIAGNOSIS — N410 Acute prostatitis: Secondary | ICD-10-CM | POA: Diagnosis not present

## 2023-10-24 ENCOUNTER — Telehealth: Payer: Self-pay

## 2023-10-24 ENCOUNTER — Other Ambulatory Visit: Payer: Self-pay

## 2023-10-24 ENCOUNTER — Ambulatory Visit: Admitting: Infectious Diseases

## 2023-10-24 VITALS — BP 99/64 | HR 91 | Temp 98.5°F | Wt 158.0 lb

## 2023-10-24 DIAGNOSIS — Z452 Encounter for adjustment and management of vascular access device: Secondary | ICD-10-CM | POA: Insufficient documentation

## 2023-10-24 DIAGNOSIS — K5190 Ulcerative colitis, unspecified, without complications: Secondary | ICD-10-CM

## 2023-10-24 DIAGNOSIS — M6281 Muscle weakness (generalized): Secondary | ICD-10-CM | POA: Diagnosis not present

## 2023-10-24 DIAGNOSIS — Z79899 Other long term (current) drug therapy: Secondary | ICD-10-CM | POA: Diagnosis not present

## 2023-10-24 DIAGNOSIS — K651 Peritoneal abscess: Secondary | ICD-10-CM | POA: Diagnosis not present

## 2023-10-24 DIAGNOSIS — N410 Acute prostatitis: Secondary | ICD-10-CM | POA: Diagnosis not present

## 2023-10-24 DIAGNOSIS — A498 Other bacterial infections of unspecified site: Secondary | ICD-10-CM

## 2023-10-24 DIAGNOSIS — M62838 Other muscle spasm: Secondary | ICD-10-CM | POA: Diagnosis not present

## 2023-10-24 DIAGNOSIS — N393 Stress incontinence (female) (male): Secondary | ICD-10-CM | POA: Diagnosis not present

## 2023-10-24 DIAGNOSIS — T8143XA Infection following a procedure, organ and space surgical site, initial encounter: Secondary | ICD-10-CM | POA: Insufficient documentation

## 2023-10-24 NOTE — Telephone Encounter (Signed)
 Per Dr. Dea will need to extend patient antibiotics through his next appointment. Pt scheduled on 11/12/23. Community message sent to [employer] with orders. Lorenda CHRISTELLA Code, RMA

## 2023-10-24 NOTE — Progress Notes (Unsigned)
 Patient Active Problem List   Diagnosis Date Noted   Postprocedural intraabdominal abscess (HCC) 10/24/2023   PICC (peripherally inserted central catheter) in place 10/24/2023   Medication management 10/24/2023   Prostate cancer (HCC) 09/26/2023   Sensorineural hearing loss, bilateral 07/25/2023   Tinnitus of both ears 07/25/2023   Pre-op evaluation 05/31/2023   Ulcerative colitis without complications (HCC) 03/02/2022   Type 2 diabetes mellitus with complication, without long-term current use of insulin  (HCC) 11/24/2019   STEMI (ST elevation myocardial infarction) (HCC) 11/23/2019   STEMI involving left anterior descending coronary artery (HCC) 11/23/2019   Coronary artery disease    Hyperlipidemia LDL goal <70    Pure hypercholesterolemia 12/25/2012   Coronary atherosclerosis of native coronary artery 12/25/2012   Iron deficiency anemia, unspecified 12/25/2012   Anemia, unspecified 12/25/2012   Allergic rhinitis 12/25/2012   GERD (gastroesophageal reflux disease) 12/25/2012   Left sided ulcerative (chronic) colitis (HCC) 12/25/2012   Pain in joint, pelvic region and thigh 12/25/2012   Other and unspecified hyperlipidemia 12/25/2012   Other specified conditions influencing health status(V49.89) 12/25/2012   Elevated prostate specific antigen (PSA) 12/25/2012   Extrinsic asthma 12/25/2012   Hypercalcemia 12/25/2012   Mixed hyperlipidemia 12/25/2012   Ankylosing spondylitis (HCC) 12/25/2012    Patient's Medications  New Prescriptions   No medications on file  Previous Medications   ALBUTEROL  (VENTOLIN  HFA) 108 (90 BASE) MCG/ACT INHALER    INHALE 2 PUFFS INTO THE LUNGS EVERY 6 HOURS AS NEEDED FOR WHEEZING   ASPIRIN  EC 81 MG TABLET    Take 1 tablet (81 mg total) by mouth daily. Swallow whole.   CYCLOBENZAPRINE  (FLEXERIL ) 5 MG TABLET    Take 1 tablet (5 mg total) by mouth at bedtime.   FERROUS GLUCONATE (IRON 27 PO)    Take 27 mg by mouth daily.   GABAPENTIN  (NEURONTIN ) 100  MG CAPSULE    Take 200 mg by mouth at bedtime.   HYOSCYAMINE  (ANASPAZ ) 0.125 MG TBDP DISINTERGRATING TABLET    Place 1 tablet (0.125 mg total) under the tongue every 6 (six) hours as needed for up to 20 doses.   ICOSAPENT  ETHYL (VASCEPA ) 1 G CAPSULE    Take 2 capsules (2 g total) by mouth 2 (two) times daily.   LISINOPRIL  (ZESTRIL ) 2.5 MG TABLET    Take 1 tablet (2.5 mg total) by mouth daily.   LORATADINE  (CLARITIN ) 10 MG TABLET    Take 10 mg by mouth daily.   METFORMIN  (GLUCOPHAGE -XR) 500 MG 24 HR TABLET    TAKE 2 TABLETS(1,000 MG) BY MOUTH DAILY WITH BREAKFAST AND 1 TABLET(500 MG) DAILY WITH DINNER   METOPROLOL  SUCCINATE (TOPROL -XL) 50 MG 24 HR TABLET    TAKE 1 TABLET(50 MG) BY MOUTH DAILY WITH OR IMMEDIATELY FOLLOWING A MEAL   MULTIPLE VITAMINS-MINERALS (CENTRUM CARDIO) TABS    Take 1 tablet by mouth 2 (two) times daily.   NITROGLYCERIN  (NITROSTAT ) 0.4 MG SL TABLET    Place 1 tablet (0.4 mg total) under the tongue every 5 (five) minutes as needed for chest pain.   OXYCODONE  (OXY IR/ROXICODONE ) 5 MG IMMEDIATE RELEASE TABLET    Take 1 tablet (5 mg total) by mouth every 6 (six) hours as needed for up to 12 doses for moderate pain (pain score 4-6).   PANTOPRAZOLE  (PROTONIX ) 40 MG TABLET    TAKE 1 TABLET(40 MG) BY MOUTH DAILY   PIPERACILLIN-TAZOBACTAM (ZOSYN) IVPB    Inject 13.5 g into the vein as directed. Over a 24  hour period   POLYETHYLENE GLYCOL (MIRALAX  / GLYCOLAX ) 17 G PACKET    Take 17 g by mouth 2 (two) times daily.   REPATHA  SURECLICK 140 MG/ML SOAJ    ADMINISTER 1 ML UNDER THE SKIN EVERY 14 DAYS   SULFASALAZINE  (AZULFIDINE ) 500 MG EC TABLET    TAKE 2 TABLETS BY MOUTH TWICE DAILY   TAMSULOSIN  (FLOMAX ) 0.4 MG CAPS CAPSULE    Take 1 capsule (0.4 mg total) by mouth daily after supper.   THEOPHYLLINE  (THEODUR) 300 MG 12 HR TABLET    TAKE 1 TABLET BY MOUTH TWICE DAILY   VEDOLIZUMAB  (ENTYVIO ) 300 MG INJECTION    Inject 300 mg into the vein every 8 (eight) weeks.  Modified Medications   No  medications on file  Discontinued Medications   No medications on file    Subjective: Discussed the use of AI scribe software for clinical note transcription with the patient, who gave verbal consent to proceed.   63 Y O Male with prior h/o arthritis/ankylosing spondylitis, asthma, UC, CAD, DM, HTN, HLD,  ED, Depression, Fibromyalgia, GERD, skin ca of eye lid who is referred for HFU after recent admission 7/10-7/14 where he underwent robotic assisted laparoscopic radical prostatectomy and bilateral pelvic lymph node dissection.   Hospital course was complicated by obturator nerve injury as well as tachycardia from postop day 2 including leukocytosis.  Started on cefepime  for concerns for UTI with improvement in tachycardia as well as leukocytosis.  Discharged on 7/14 with transition to cefpodoxime .  However CT abdomen pelvis on 7/14 with concern for abscess and ID was consulted for arrangement of PICC line and IV antibiotics.   PICC was placed on 7/23 and was started on Zosyn on 7/24 with plan for 3 weeks.   8/7  Accompanied by his wife.  Reports he is getting IV Zosyn through right arm PICC without any concerns related to PICC or antibiotics.  Denies nausea, vomiting or diarrhea or rashes. However, experiences fatigue, especially upon waking, and occasional pain with movement. Denies any abdominal pain, flank pain or fever and chills.  Appetite is okay. He has not followed up with urologist after discharge from the hospital.   Review of Systems: All systems reviewed with pertinent positive and negative as listed above  Past Medical History:  Diagnosis Date   Anemia    Ankylosing spondylitis (HCC)    Arthritis    Asthma    SINCE AGE 60   Colitis, ulcerative (HCC)    Coronary artery disease    Bare metal LAD stent 10/08   Coronary atherosclerosis of native coronary artery 12/25/2012   Depression    Diabetes mellitus without complication [facility])    ED (erectile dysfunction)    Elevated  prostate specific antigen (PSA) 12/25/2012   Esophageal reflux 12/25/2012   Extrinsic asthma, unspecified 12/25/2012   Fibromyalgia    GERD (gastroesophageal reflux disease)    History of heart attack    Hypercalcemia 12/25/2012   Hyperlipidemia    Hypertension    Left sided ulcerative (chronic) colitis (HCC) 12/25/2012   Mixed hyperlipidemia 12/25/2012   Myocardial infarction [facility])    Pneumonia    as a child   Prostate cancer (HCC) 06/06/2023   Pure hypercholesterolemia 12/25/2012   Skin cancer of eyelid    TMJ (dislocation of temporomandibular joint)    Past Surgical History:  Procedure Laterality Date   CATARACT EXTRACTION, BILATERAL     COLONOSCOPY     CORONARY/GRAFT ACUTE MI REVASCULARIZATION N/A 11/23/2019  Procedure: Coronary/Graft Acute MI Revascularization;  Surgeon: Wonda Sharper, MD;  Location: [facility] INVASIVE CV LAB;  Service: Cardiovascular;  Laterality: N/A;   EYE SURGERY Left    Inital lesion bx of eyelid   EYE SURGERY Left    Left eyelid, Moh's excision for skin cancer   LEFT HEART CATH AND CORONARY ANGIOGRAPHY N/A 11/23/2019   Procedure: LEFT HEART CATH AND CORONARY ANGIOGRAPHY;  Surgeon: Wonda Sharper, MD;  Location: [facility] INVASIVE CV LAB;  Service: Cardiovascular;  Laterality: N/A;   PROSTATE BIOPSY N/A 06/06/2023   Procedure: BIOPSY, PROSTATE, RECTAL APPROACH, WITH US  GUIDANCE;  Surgeon: Shane Steffan BROCKS, MD;  Location: [facility] ORS;  Service: Urology;  Laterality: N/A;   RADIOLOGY WITH ANESTHESIA N/A 08/27/2023   Procedure: MRI WITH ANESTHESIA;  Surgeon: Radiologist, Medication, MD;  Location: [facility] OR;  Service: Radiology;  Laterality: N/A;  MRI OF PROSTATE WITH AND WITHOUT CONTRAST   ROBOT ASSISTED LAPAROSCOPIC RADICAL PROSTATECTOMY N/A 09/26/2023   Procedure: PROSTATECTOMY, RADICAL, ROBOT-ASSISTED, LAPAROSCOPIC;  Surgeon: Shane Steffan BROCKS, MD;  Location: [facility] ORS;  Service: Urology;  Laterality: N/A;   skull fracture after falling down stairs  1983   had  craniectomy to remove a piece of bone.   TONSILLECTOMY     TRANSURETHRAL RESECTION OF BLADDER TUMOR N/A 06/06/2023   Procedure: Removal of bladder stone   ,Right ureteroscopy diagnostic cytology obtained. , Bladder biopsy near left ureteral orifice , Transurethral resection of prostate removal of median lobe. ,  Prostate biopsy, Urethral dilation;  Surgeon: Shane Steffan BROCKS, MD;  Location: [facility] ORS;  Service: Urology;  Laterality: N/A;  BILATERAL RETROGRADE PYELOGRAM, POSSIBLE GEMCITABINE INSTILLATION  '  Social History   Tobacco Use   Smoking status: Never    Passive exposure: Never   Smokeless tobacco: Former    Types: Snuff  Vaping Use   Vaping status: Never Used  Substance Use Topics   Alcohol use: Yes    Comment: Occasional beer   Drug use: Never    Family History  Problem Relation Age of Onset   Hypertension Mother    Lung cancer Father    Lung cancer Sister    GER disease Sister    Brain cancer Sister    Diabetes Maternal Grandmother    Emphysema Maternal Grandfather    COPD Maternal Grandfather    Pancreatic cancer Neg Hx    Stomach cancer Neg Hx    Colon cancer Neg Hx    Esophageal cancer Neg Hx    Colon polyps Neg Hx    Heart disease Neg Hx     Allergies  Allergen Reactions   Crestor  [Rosuvastatin ]     Other Reaction(s): d/c'd due to elevated LFTs    Health Maintenance  Topic Date Due   Zoster Vaccines- Shingrix (1 of 2) Never done   OPHTHALMOLOGY EXAM  11/01/2022   INFLUENZA VACCINE  10/18/2023   COVID-19 Vaccine (3 - Pfizer risk series) 01/16/2024 (Originally 08/14/2019)   DTaP/Tdap/Td (4 - Td or Tdap) 01/17/2024 (Originally 01/07/2021)   Diabetic kidney evaluation - Urine ACR  03/06/2024   HEMOGLOBIN A1C  03/13/2024   FOOT EXAM  09/11/2024   Diabetic kidney evaluation - eGFR measurement  09/29/2024   Colonoscopy  07/14/2031   Pneumococcal Vaccine: 19-49 Years  Completed   Pneumococcal Vaccine: 50+ Years  Completed   Hepatitis C Screening   Completed   HIV Screening  Completed   Hepatitis B Vaccines  Aged Out   HPV VACCINES  Aged Out   Meningococcal  B Vaccine  Aged Out    Objective: BP 99/64   Pulse 91   Temp 98.5 F (36.9 C) (Oral)   Wt 158 lb (71.7 kg)   SpO2 97%   BMI 24.02 kg/m '  Physical Exam Constitutional:      Appearance: Normal appearance. Hard of hearing HENT:     Head: Normocephalic and atraumatic.      Mouth: Mucous membranes are moist.  Eyes:    Conjunctiva/sclera: Conjunctivae normal.     Pupils: Pupils are equal, round, and b/l symmetrical    Cardiovascular:     Rate and Rhythm: Normal rate and regular rhythm.     Heart sounds: s1s2  Pulmonary:     Effort: Pulmonary effort is normal.     Breath sounds: Normal breath sounds.   Abdominal:     General: Non distended     Palpations: soft.   Musculoskeletal:        General: Normal range of motion.   Skin:    General: Skin is warm and dry.     Comments: RT arm PICC ok with no signs of infection   Neurological:     General: grossly non focal     Mental Status: awake, alert and oriented to person, place, and time.   Psychiatric:        Mood and Affect: Mood normal.   Lab Results Lab Results  Component Value Date   WBC 9.5 09/30/2023   HGB 9.4 (L) 09/30/2023   HCT 28.5 (L) 09/30/2023   MCV 90.2 09/30/2023   PLT 356 09/30/2023    Lab Results  Component Value Date   CREATININE 0.70 09/30/2023   BUN 19 09/30/2023   NA 134 (L) 09/30/2023   K 3.4 (L) 09/30/2023   CL 100 09/30/2023   CO2 22 09/30/2023    Lab Results  Component Value Date   ALT 20 09/17/2023   AST 18 09/17/2023   ALKPHOS 87 09/17/2023   BILITOT 0.7 09/17/2023    Lab Results  Component Value Date   CHOL 105 03/07/2023   HDL 40 03/07/2023   LDLCALC 47 03/07/2023   TRIG 92 03/07/2023   CHOLHDL 2.6 03/07/2023   No results found for: LABRPR, RPRTITER No results found for: HIV1RNAQUANT, HIV1RNAVL, CD4TABS   Microbiology  Results for orders  placed or performed during the hospital encounter of 09/26/23  Urine Culture (for pregnant, neutropenic or urologic patients or patients with an indwelling urinary catheter)     Status: Abnormal   Collection Time: 09/30/23  5:28 PM   Specimen: Urine, Clean Catch  Result Value Ref Range Status   Specimen Description   Final    URINE, CLEAN CATCH Performed at [facility], [address] [place], [place] [address]    Special Requests   Final    NONE Performed at [facility], [address], [place], [place] [address]    Culture 30,000 COLONIES/mL PSEUDOMONAS AERUGINOSA (A)  Final   Report Status 10/04/2023 FINAL  Final   Organism ID, Bacteria PSEUDOMONAS AERUGINOSA (A)  Final      Susceptibility   Pseudomonas aeruginosa - MIC*    CEFTAZIDIME 32 RESISTANT Resistant     CIPROFLOXACIN 2 RESISTANT Resistant     GENTAMICIN  <=1 SENSITIVE Sensitive     PIP/TAZO <=4 SENSITIVE Sensitive ug/mL    CEFEPIME  RESISTANT Resistant     * 30,000 COLONIES/mL PSEUDOMONAS AERUGINOSA   Imaging  IR PICC PLACEMENT RIGHT >5 YRS INC  IMG GUIDE Result Date: 10/09/2023 INDICATION: 63 year old male with prostatitis and long term antibiotic needs for PICC line placement. EXAM: ULTRASOUND AND FLUOROSCOPIC GUIDED PICC LINE INSERTION MEDICATIONS: 1% lidocaine  CONTRAST:  None FLUOROSCOPY: Radiation Exposure Index (as provided by the fluoroscopic device): 7.8 mGy Kerma COMPLICATIONS: None immediate. TECHNIQUE: The procedure, risks, benefits, and alternatives were explained to the patient and informed consent was obtained. The right upper extremity was prepped with chlorhexidine  in a sterile fashion, and a sterile drape was applied covering the operative field. Maximum barrier sterile technique with sterile gowns and gloves were used for the procedure. A timeout was performed prior to the initiation of the procedure. Local anesthesia was provided with 1% lidocaine . After the overlying  soft tissues were anesthetized with 1% lidocaine , a micropuncture kit was utilized to access the right basilic vein. Real-time ultrasound guidance was utilized for vascular access including the acquisition of a permanent ultrasound image documenting patency of the accessed vessel. A guidewire was advanced to the level of the superior caval-atrial junction for measurement purposes and the PICC line was cut at an angle to length. A peel-away sheath was placed and a 41 cm, 5 [place], single lumen was inserted to level of the superior caval-atrial junction. A post procedure spot fluoroscopic was obtained. The catheter easily aspirated and flushed and was secured in place. A dressing was placed. The patient tolerated the procedure well without immediate post procedural complication. FINDINGS: After catheter placement, the tip lies within the superior cavoatrial junction. The catheter aspirates and flushes normally and is ready for immediate use. IMPRESSION: Successful ultrasound and fluoroscopic guided placement of a right basilic vein approach, 41 cm, 5 French, single lumen PICC with tip at the superior caval-atrial junction. The PICC line is ready for immediate use. Performed By Lavanda Jurist, PA-C Electronically Signed   By: JONETTA Faes M.D.   On: 10/09/2023 15:54   CT ABDOMEN PELVIS W CONTRAST Addendum Date: 09/30/2023 ADDENDUM REPORT: 09/30/2023 17:10 ADDENDUM: Bilateral pelvic side wall structures explained above (questionable for pelvic abscesses) containing foci of gas, was clarified and correlate to surgical Fibrillar hemostats. Findings were discussed with Dr. Shane by Dr Duwaine Severs at 2 p.m. on September 30, 2023. Electronically Signed   By: Megan  Zare M.D.   On: 09/30/2023 17:10   Result Date: 09/30/2023 CLINICAL DATA:  Abdominal pain and hematuria, prostate cancer EXAM: CT ABDOMEN AND PELVIS WITH CONTRAST TECHNIQUE: Multidetector CT imaging of the abdomen and pelvis was performed using the standard  protocol following bolus administration of intravenous contrast. RADIATION DOSE REDUCTION: This exam was performed according to the departmental dose-optimization program which includes automated exposure control, adjustment of the mA and/or kV according to patient size and/or use of iterative reconstruction technique. CONTRAST:  OMNIPAQUE  IOHEXOL  300 MG/ML  SOLN COMPARISON:  CT abdomen pelvis August 29 2023 MR prostate August 27, 2023 FINDINGS: Lower chest: No suspicious nodule Hepatobiliary: No focal liver abnormality is seen. No biliary dilatation. Gallbladder sludge. Pancreas: Mildly atrophic changes of the pancreas otherwise unremarkable. Spleen: Normal in size without focal abnormality. Adrenals/Urinary Tract: Nonobstructive punctate nephrolithiasis (at least 3 on each side) measuring 1-2 mm in both kidney. No hydronephrosis. Subcentimeter cortical lesions in both kidneys, too small to characterize likely representing simple cysts. Both adrenal glands are unremarkable. Bladder is thick walled with mucosal enhancement and under distended containing Foley catheter balloon. Foci of gas are identified within the lumen. There is wall thickening involving the left bladder wall diverticulum with abnormal enhancement. Perivesical fat  stranding is present. Foci of gas within the bladder may correlate to recent instrumentation or infection/inflammation. Bilateral fluid collections along the bilateral pelvic cavity sidewalls containing punctate foci of air consistent with abscess formation, new to prior (2/70, 67). Index left pelvic wall gas containing complex fluid measures 9.2 x 3.7 cm (2/70). Smaller to fully arteries identified in right subdiaphragmatic/perihepatic region Small amount of free fluid is identified in pelvic cavity. Additional pocket of fluid collection anterior to the bladder measures 4.6 x 3 cm (6/54). Pneumoperitoneum predominantly in subhepatic and right subdiaphragmatic region. Stomach/Bowel:  Small hiatal hernia.  No bowel obstruction. Vascular/Lymphatic: Atherosclerotic calcifications of aorta. Subcentimeter para-aortic lymph nodes. Reproductive: Surgical resection of the prostate. Seminal vesicles are normal. Other: Fat stranding in the pelvic cavity and small free fluid. Pneumoperitoneum (see above). Small fat containing left inguinal hernia. Musculoskeletal: Multilevel degenerative changes of the spine. Lytic lesion of L2 vertebral body, stable to prior IMPRESSION: 1. Interval prostatectomy. 2. Bilateral pelvic complex fluid collections/abscesses containing foci of gas along the pelvic sidewalls, small pneumoperitoneum predominantly in right subdiaphragmatic/perihepatic, wall thickening and mucosal enhancement of the bladder including left posterolateral bladder diverticulum (cystitis), small amount of free pelvic fluid and moderate mesenteric fat stranding. Constellation of findings are suggestive of infectious/inflammatory process with abscess formation . Free air within the peritoneal cavity may correlate to recent surgery, infection/inflammation or perforation. This finding is new to prior CT. 3. Hypodense/lytic lesion involving L2 vertebral body, indeterminate. Findings are not typical for hemangioma. Prostate metastasis are predominantly sclerotic and is unlikely. Recommend follow-up with dedicated MRI lumbar spine. Electronically Signed: By: Megan  Zare M.D. On: 09/30/2023 15:52   CT Angio Chest Pulmonary Embolism (PE) W or WO Contrast Result Date: 09/28/2023 CLINICAL DATA:  High probability for pulmonary embolism. Two days postop from radical prostatectomy for prostate carcinoma. * Tracking Code: BO * EXAM: CT ANGIOGRAPHY CHEST WITH CONTRAST TECHNIQUE: Multidetector CT imaging of the chest was performed using the standard protocol during bolus administration of intravenous contrast. Multiplanar CT image reconstructions and MIPs were obtained to evaluate the vascular anatomy. RADIATION DOSE  REDUCTION: This exam was performed according to the departmental dose-optimization program which includes automated exposure control, adjustment of the mA and/or kV according to patient size and/or use of iterative reconstruction technique. CONTRAST:  75mL OMNIPAQUE  IOHEXOL  350 MG/ML SOLN COMPARISON:  None available FINDINGS: Cardiovascular: Satisfactory opacification of pulmonary arteries noted, although evaluation of lower lobe pulmonary arteries is limited by respiratory motion artifact. No pulmonary emboli identified. No evidence of thoracic aortic dissection or aneurysm. Mediastinum/Nodes: No masses or pathologically enlarged lymph nodes identified. Lungs/Pleura: Right lower lobe subsegmental atelectasis noted. No evidence of pulmonary consolidation or pleural effusion. Upper abdomen: Free intraperitoneal air noted, presumably postop in etiology. Musculoskeletal: No suspicious bone lesions identified. Review of the MIP images confirms the above findings. IMPRESSION: No evidence of pulmonary embolism. Right lower lobe subsegmental atelectasis. Free intraperitoneal air, presumably postop in etiology. Electronically Signed   By: Norleen DELENA Kil M.D.   On: 09/28/2023 11:14    Assessment/Plan # Post surgical bilateral pelvic abscesses  # Urine cultures positive for PsA ( S to zosyn)  Plan  - continue zosyn as in OPAT  - Repeat CT abdomen/pelvis w contrast - fu in 7-10 days after CT to decide on further duration of antibiotics. Continue IV zosyn tentatively until next appt  # Medication Monitoring  - 7/24 wbc 8.5/hb 9.2/plts 607, CMP unremarkable with 0.82, CRP 100   # Ulcerative colitis  - on sulfasalzine,  vedolizumab  - follows GI  # Hypodense/lytic lesion in l2 vertebral body, indeterminate - stable on CT 7/14  I spent 63 minutes involved in face-to-face and non-face-to-face activities for this patient on the day of the visit. Professional time spent includes the following activities: Preparing  to see the patient (review of tests), Obtaining and reviewing separately obtained history (discharge record 7/14, Dr Overton notes), Performing a medically appropriate examination and evaluation , Ordering medications, imaging,  Documenting clinical information in the EMR, Independently interpreting results (not separately reported), Communicating results to the patient/wife, Counseling and educating the patient/wife and Care coordination (not separately reported).   Of note, portions of this note may have been created with voice recognition software. While this note has been edited for accuracy, occasional wrong-word or 'sound-a-like' substitutions may have occurred due to the inherent limitations of voice recognition software.   Annalee Joseph, MD [facility] 10/24/2023, 10:15 AM

## 2023-10-25 DIAGNOSIS — N410 Acute prostatitis: Secondary | ICD-10-CM | POA: Diagnosis not present

## 2023-10-25 DIAGNOSIS — A498 Other bacterial infections of unspecified site: Secondary | ICD-10-CM | POA: Insufficient documentation

## 2023-10-26 DIAGNOSIS — N410 Acute prostatitis: Secondary | ICD-10-CM | POA: Diagnosis not present

## 2023-10-27 DIAGNOSIS — N410 Acute prostatitis: Secondary | ICD-10-CM | POA: Diagnosis not present

## 2023-10-28 ENCOUNTER — Telehealth: Payer: Self-pay

## 2023-10-28 ENCOUNTER — Telehealth: Payer: Self-pay | Admitting: Cardiology

## 2023-10-28 ENCOUNTER — Other Ambulatory Visit: Payer: Self-pay

## 2023-10-28 DIAGNOSIS — N410 Acute prostatitis: Secondary | ICD-10-CM | POA: Diagnosis not present

## 2023-10-28 NOTE — Telephone Encounter (Signed)
-----   Message from Annalee Joseph sent at 10/28/2023  9:39 AM EDT ----- Regarding: OPAT I have discussed with patient's Urologist Dr Shane about CT abdomen findings on 7/14, states the abscesses read in CT are unlikely to be abscesses and these are coagulation products that was placed to stop bleeding. CT report has been addended.  Given that, we discussed to stop antibiotics ( has already completed 2+ weeks of zosyn) and also cancel the upcoming CT abdomen/pelvis.   Please keep the next fu appt with us  as planned however.

## 2023-10-28 NOTE — Telephone Encounter (Signed)
 Attempted to call patient regarding message from MD. Will need to have pt stop ANTBX and cancel upcoming CT appointment. Will route message to [employer] to request pull picc.  Lorenda CHRISTELLA Code, RMA

## 2023-10-28 NOTE — Telephone Encounter (Signed)
 Patient called to ask if he should restart his Plavix . He is concerned due to his risk factors, and the passing of his friend from MI yesterday prompted him to call our office.  Plavix  was discontinued in June due to hematuria. Patient underwent prostatectomy 09/26/23. He denies any recent hematuria since his procedure. He is taking ASA 81 mg daily.  Will forward to Dr. Elmira to review and advise on restarting Plavix .

## 2023-10-28 NOTE — Telephone Encounter (Signed)
 Pt c/o medication issue:  1. Name of Medication: Plavix   2. How are you currently taking this medication (dosage and times per day)?   3. Are you having a reaction (difficulty breathing--STAT)? No  4. What is your medication issue? Pt states that above medication has been on Hold due to surgery that was last month. Pt would like to know when is he able to restart medication. Please advise

## 2023-10-28 NOTE — Telephone Encounter (Signed)
 Clarifying that you are wanting to stop aspirin  81 since in the same medication class?

## 2023-10-28 NOTE — Transitions of Care (Post Inpatient/ED Visit) (Signed)
  Transition of Care [facility] Program Closure  Visit Note  10/28/2023  Name: ABDULKAREEM Carter MRN: [id number]          DOB: January 18, 1961  Situation: Patient enrolled in [facility] 30-day program. Visit completed with patient and wife by telephone.   Background: Patient has completed TOC Program  Initial Transition Care Management Follow-up Telephone Call    Past Medical History:  Diagnosis Date   Anemia    Ankylosing spondylitis (HCC)    Arthritis    Asthma    SINCE AGE 46   Colitis, ulcerative (HCC)    Coronary artery disease    Bare metal LAD stent 10/08   Coronary atherosclerosis of native coronary artery 12/25/2012   Depression    Diabetes mellitus without complication [facility])    ED (erectile dysfunction)    Elevated prostate specific antigen (PSA) 12/25/2012   Esophageal reflux 12/25/2012   Extrinsic asthma, unspecified 12/25/2012   Fibromyalgia    GERD (gastroesophageal reflux disease)    History of heart attack    Hypercalcemia 12/25/2012   Hyperlipidemia    Hypertension    Left sided ulcerative (chronic) colitis (HCC) 12/25/2012   Mixed hyperlipidemia 12/25/2012   Myocardial infarction (HCC)    Pneumonia    as a child   Prostate cancer (HCC) 06/06/2023   Pure hypercholesterolemia 12/25/2012   Skin cancer of eyelid    TMJ (dislocation of temporomandibular joint)     Assessment: Patient Reported Symptoms: Patient states he feels fine and is just happy antibiotic is being stopped and PICC line is being pulled  Patient continues to decline medication review Medications Reviewed Today   Medications were not reviewed in this encounter     Recommendation:   Continue with MD follow up  Follow Up Plan:   Closing From:  Transitions of Care Program  Shona Prow RN, CCM [facility]  VBCI-Population Health RN Care Manager [phone]

## 2023-10-28 NOTE — Telephone Encounter (Signed)
 Yes, okay to resume Plavix  75 mg daily.  Thanks MJP

## 2023-10-29 DIAGNOSIS — N410 Acute prostatitis: Secondary | ICD-10-CM | POA: Diagnosis not present

## 2023-10-30 DIAGNOSIS — N410 Acute prostatitis: Secondary | ICD-10-CM | POA: Diagnosis not present

## 2023-10-30 NOTE — Telephone Encounter (Signed)
 Correct. He does not need dual antiplatelet therapy. Monotherapy with Plavix  is sufficient given stents > 63 years old.  Thanks MJP

## 2023-10-30 NOTE — Telephone Encounter (Signed)
 Spoke with pt who states he was already told this information yesterday and has no further questions. Pt states he stopped Aspirin  and restarted his Plavix  today and will let us  know if there are any issues moving forward.

## 2023-11-04 ENCOUNTER — Ambulatory Visit (HOSPITAL_BASED_OUTPATIENT_CLINIC_OR_DEPARTMENT_OTHER)

## 2023-11-07 DIAGNOSIS — M6281 Muscle weakness (generalized): Secondary | ICD-10-CM | POA: Diagnosis not present

## 2023-11-07 DIAGNOSIS — M62838 Other muscle spasm: Secondary | ICD-10-CM | POA: Diagnosis not present

## 2023-11-07 DIAGNOSIS — N393 Stress incontinence (female) (male): Secondary | ICD-10-CM | POA: Diagnosis not present

## 2023-11-12 ENCOUNTER — Ambulatory Visit: Admitting: Internal Medicine

## 2023-11-20 DIAGNOSIS — C61 Malignant neoplasm of prostate: Secondary | ICD-10-CM | POA: Diagnosis not present

## 2023-12-04 DIAGNOSIS — N393 Stress incontinence (female) (male): Secondary | ICD-10-CM | POA: Diagnosis not present

## 2023-12-04 DIAGNOSIS — M6281 Muscle weakness (generalized): Secondary | ICD-10-CM | POA: Diagnosis not present

## 2023-12-04 DIAGNOSIS — M62838 Other muscle spasm: Secondary | ICD-10-CM | POA: Diagnosis not present

## 2023-12-05 ENCOUNTER — Other Ambulatory Visit: Payer: Self-pay | Admitting: Orthopedic Surgery

## 2023-12-05 DIAGNOSIS — E1169 Type 2 diabetes mellitus with other specified complication: Secondary | ICD-10-CM

## 2023-12-26 ENCOUNTER — Telehealth: Payer: Self-pay | Admitting: *Deleted

## 2023-12-26 NOTE — Telephone Encounter (Signed)
 Copied from CRM [id number]. Topic: Referral - Request for Referral >> Dec 26, 2023  3:59 PM Diannia H wrote: Did the patient discuss referral with their provider in the last year? Yes (If No - schedule appointment) (If Yes - send message)  Appointment offered? No  Type of order/referral and detailed reason for visit: ENT  Preference of office, provider, location: Dr. Camellia Milliner [phone]  If referral order, have you been seen by this specialty before? Yes (If Yes, this issue or another issue? When? Where? Yes, has hearing aides but they are not working  Can we respond through MyChart? Yes

## 2023-12-27 ENCOUNTER — Other Ambulatory Visit: Payer: Self-pay | Admitting: Orthopedic Surgery

## 2023-12-27 DIAGNOSIS — H903 Sensorineural hearing loss, bilateral: Secondary | ICD-10-CM

## 2023-12-27 NOTE — Telephone Encounter (Signed)
 Spoke with patient and shared providers response

## 2023-12-27 NOTE — Telephone Encounter (Signed)
 Referral made for Dr. Thaddeus.

## 2024-01-06 ENCOUNTER — Telehealth: Payer: Self-pay | Admitting: Orthopedic Surgery

## 2024-01-06 NOTE — Telephone Encounter (Signed)
 Copied from CRM [id number]. Topic: Referral - Question >> Jan 06, 2024  2:50 PM Merlynn A wrote: Reason for CRM: Patient called in requesting that Dr. Gil or Nurse call regarding referral [phone] and provide referral information over phone as they do not accept the referral via fax. Please contact Dr. Thaddeus office to provide referral info for patient.

## 2024-01-07 DIAGNOSIS — M6281 Muscle weakness (generalized): Secondary | ICD-10-CM | POA: Diagnosis not present

## 2024-01-07 DIAGNOSIS — M62838 Other muscle spasm: Secondary | ICD-10-CM | POA: Diagnosis not present

## 2024-01-07 DIAGNOSIS — N393 Stress incontinence (female) (male): Secondary | ICD-10-CM | POA: Diagnosis not present

## 2024-01-07 NOTE — Telephone Encounter (Signed)
 Harold Harold BRAVO, NP to [facility] (Selected Message)     01/07/24 11:25 AM Can you please reach out to referral coordinator on how to handle this? Maybe they will make a email referral. I placed referral awhile back.    Thanks, [name] please advise

## 2024-01-16 ENCOUNTER — Ambulatory Visit: Admitting: Orthopedic Surgery

## 2024-01-16 ENCOUNTER — Encounter: Payer: Self-pay | Admitting: Orthopedic Surgery

## 2024-01-16 VITALS — BP 120/82 | HR 92 | Temp 98.5°F | Ht 68.0 in | Wt 169.0 lb

## 2024-01-16 DIAGNOSIS — R29898 Other symptoms and signs involving the musculoskeletal system: Secondary | ICD-10-CM

## 2024-01-16 DIAGNOSIS — Z8546 Personal history of malignant neoplasm of prostate: Secondary | ICD-10-CM

## 2024-01-16 DIAGNOSIS — H903 Sensorineural hearing loss, bilateral: Secondary | ICD-10-CM

## 2024-01-16 DIAGNOSIS — Z23 Encounter for immunization: Secondary | ICD-10-CM | POA: Diagnosis not present

## 2024-01-16 DIAGNOSIS — E1142 Type 2 diabetes mellitus with diabetic polyneuropathy: Secondary | ICD-10-CM

## 2024-01-16 DIAGNOSIS — R000 Tachycardia, unspecified: Secondary | ICD-10-CM

## 2024-01-16 DIAGNOSIS — K5190 Ulcerative colitis, unspecified, without complications: Secondary | ICD-10-CM | POA: Diagnosis not present

## 2024-01-16 DIAGNOSIS — I2510 Atherosclerotic heart disease of native coronary artery without angina pectoris: Secondary | ICD-10-CM

## 2024-01-16 MED ORDER — METOPROLOL SUCCINATE ER 50 MG PO TB24: 50.0000 mg | ORAL_TABLET | Freq: Every day | ORAL | 2 refills | Status: AC

## 2024-01-16 NOTE — Patient Instructions (Addendum)
 Please reschedule appointment with GI doctor   Continue Kegel exercises> try to hold stream after you start urinating   Drink water  to dilute urine

## 2024-01-16 NOTE — Progress Notes (Unsigned)
 Careteam: Patient Care Team: Gil Greig BRAVO, NP as PCP - General (Adult Health Nurse Practitioner) Elmira Newman PARAS, MD as PCP - Cardiology (Cardiology)  Seen by: Greig Gil, AGNP-C  PLACE OF SERVICE:  [facility]  Advanced Directive information    Allergies  Allergen Reactions   Crestor  [Rosuvastatin ]     Other Reaction(s): d/c'd due to elevated LFTs    Chief Complaint  Patient presents with   Follow-up    4 month follow up     HPI: Patient is a 63 y.o. male seen today for medical management of chronic conditions.   Discussed the use of AI scribe software for clinical note transcription with the patient, who gave verbal consent to proceed.  History of Present Illness   [name] is a 63 year old male with prostate cancer who presents with urinary symptoms and left leg weakness.  He underwent a prostatectomy on July 10th, following a consultation on June 26th. Post-surgery, he experienced complications, including urinary incontinence, which has improved significantly over the past three weeks, allowing him to stop using incontinence products. However, he has occasional pain at the onset of urination, occurring about half the time, which started approximately six weeks ago. The urine is regular in color with good flow, though sometimes it sprays. He acknowledges not drinking enough water .  During the prostatectomy, a nerve affecting his left leg was impacted, resulting in significant weakness but no pain. He maintains physical activity by doing cardio at [employer] two to three times a week and walking his dogs.  He has a history of ulcerative colitis but has not seen his GI specialist this year due to the surgery. He has also missed his bi-monthly infusion treatments and plans to contact his specialist to resume care.  He has a history of a heart attack and is on metoprolol , which he recently lost and needs a refill. He is also on Repatha  injections every two  weeks for cholesterol management. He stopped taking Jardiance  around June due to concerns about urinary issues post-surgery. He continues to take lisinopril  for kidney protection related to diabetes.  He is experiencing difficulty with erections post-surgery and has not engaged in unprotected sex. He has a follow-up appointment with his urologist in January.  He is seeking a referral to an audiologist in [facility], which has been delayed due to communication issues between the offices.         Review of Systems:  Review of Systems  Constitutional: Negative.   HENT:  Positive for hearing loss.   Eyes: Negative.   Respiratory: Negative.    Cardiovascular: Negative.   Gastrointestinal:  Negative for abdominal pain.  Genitourinary:  Positive for dysuria. Negative for flank pain and hematuria.  Musculoskeletal: Negative.   Skin: Negative.   Neurological: Negative.   Psychiatric/Behavioral: Negative.      Past Medical History:  Diagnosis Date   Anemia    Ankylosing spondylitis (HCC)    Arthritis    Asthma    SINCE AGE 72   Colitis, ulcerative (HCC)    Coronary artery disease    Bare metal LAD stent 10/08   Coronary atherosclerosis of native coronary artery 12/25/2012   Depression    Diabetes mellitus without complication [facility])    ED (erectile dysfunction)    Elevated prostate specific antigen (PSA) 12/25/2012   Esophageal reflux 12/25/2012   Extrinsic asthma, unspecified 12/25/2012   Fibromyalgia    GERD (gastroesophageal reflux disease)  History of heart attack    Hypercalcemia 12/25/2012   Hyperlipidemia    Hypertension    Left sided ulcerative (chronic) colitis (HCC) 12/25/2012   Mixed hyperlipidemia 12/25/2012   Myocardial infarction [facility])    Pneumonia    as a child   Prostate cancer (HCC) 06/06/2023   Pure hypercholesterolemia 12/25/2012   Skin cancer of eyelid    TMJ (dislocation of temporomandibular joint)    Past Surgical History:  Procedure  Laterality Date   CATARACT EXTRACTION, BILATERAL     COLONOSCOPY     CORONARY/GRAFT ACUTE MI REVASCULARIZATION N/A 11/23/2019   Procedure: Coronary/Graft Acute MI Revascularization;  Surgeon: Wonda Sharper, MD;  Location: [facility] INVASIVE CV LAB;  Service: Cardiovascular;  Laterality: N/A;   EYE SURGERY Left    Inital lesion bx of eyelid   EYE SURGERY Left    Left eyelid, Moh's excision for skin cancer   LEFT HEART CATH AND CORONARY ANGIOGRAPHY N/A 11/23/2019   Procedure: LEFT HEART CATH AND CORONARY ANGIOGRAPHY;  Surgeon: Wonda Sharper, MD;  Location: [facility] INVASIVE CV LAB;  Service: Cardiovascular;  Laterality: N/A;   PROSTATE BIOPSY N/A 06/06/2023   Procedure: BIOPSY, PROSTATE, RECTAL APPROACH, WITH US  GUIDANCE;  Surgeon: Shane Steffan BROCKS, MD;  Location: [facility] ORS;  Service: Urology;  Laterality: N/A;   RADIOLOGY WITH ANESTHESIA N/A 08/27/2023   Procedure: MRI WITH ANESTHESIA;  Surgeon: Radiologist, Medication, MD;  Location: [facility] OR;  Service: Radiology;  Laterality: N/A;  MRI OF PROSTATE WITH AND WITHOUT CONTRAST   ROBOT ASSISTED LAPAROSCOPIC RADICAL PROSTATECTOMY N/A 09/26/2023   Procedure: PROSTATECTOMY, RADICAL, ROBOT-ASSISTED, LAPAROSCOPIC;  Surgeon: Shane Steffan BROCKS, MD;  Location: [facility] ORS;  Service: Urology;  Laterality: N/A;   skull fracture after falling down stairs  1983   had craniectomy to remove a piece of bone.   TONSILLECTOMY     TRANSURETHRAL RESECTION OF BLADDER TUMOR N/A 06/06/2023   Procedure: Removal of bladder stone   ,Right ureteroscopy diagnostic cytology obtained. , Bladder biopsy near left ureteral orifice , Transurethral resection of prostate removal of median lobe. ,  Prostate biopsy, Urethral dilation;  Surgeon: Shane Steffan BROCKS, MD;  Location: [facility] ORS;  Service: Urology;  Laterality: N/A;  BILATERAL RETROGRADE PYELOGRAM, POSSIBLE GEMCITABINE INSTILLATION   Social History:   reports that he has never smoked. He has never been exposed to tobacco smoke. He has  quit using smokeless tobacco.  His smokeless tobacco use included snuff. He reports current alcohol use. He reports that he does not use drugs.  Family History  Problem Relation Age of Onset   Hypertension Mother    Lung cancer Father    Lung cancer Sister    GER disease Sister    Brain cancer Sister    Diabetes Maternal Grandmother    Emphysema Maternal Grandfather    COPD Maternal Grandfather    Pancreatic cancer Neg Hx    Stomach cancer Neg Hx    Colon cancer Neg Hx    Esophageal cancer Neg Hx    Colon polyps Neg Hx    Heart disease Neg Hx     Medications: Patient's Medications  New Prescriptions   No medications on file  Previous Medications   ALBUTEROL  (VENTOLIN  HFA) 108 (90 BASE) MCG/ACT INHALER    INHALE 2 PUFFS INTO THE LUNGS EVERY 6 HOURS AS NEEDED FOR WHEEZING   ASPIRIN  EC 81 MG TABLET    Take 1 tablet (81 mg total) by mouth daily. Swallow whole.   CYCLOBENZAPRINE  (FLEXERIL ) 5 MG TABLET  Take 1 tablet (5 mg total) by mouth at bedtime.   FERROUS GLUCONATE (IRON 27 PO)    Take 27 mg by mouth daily.   GABAPENTIN  (NEURONTIN ) 100 MG CAPSULE    Take 200 mg by mouth at bedtime.   HYOSCYAMINE  (ANASPAZ ) 0.125 MG TBDP DISINTERGRATING TABLET    Place 1 tablet (0.125 mg total) under the tongue every 6 (six) hours as needed for up to 20 doses.   ICOSAPENT  ETHYL (VASCEPA ) 1 G CAPSULE    Take 2 capsules (2 g total) by mouth 2 (two) times daily.   LISINOPRIL  (ZESTRIL ) 2.5 MG TABLET    TAKE 1 TABLET(2.5 MG) BY MOUTH DAILY   LORATADINE  (CLARITIN ) 10 MG TABLET    Take 10 mg by mouth daily.   METFORMIN  (GLUCOPHAGE -XR) 500 MG 24 HR TABLET    TAKE 2 TABLETS(1,000 MG) BY MOUTH DAILY WITH BREAKFAST AND 1 TABLET(500 MG) DAILY WITH DINNER   METOPROLOL  SUCCINATE (TOPROL -XL) 50 MG 24 HR TABLET    TAKE 1 TABLET(50 MG) BY MOUTH DAILY WITH OR IMMEDIATELY FOLLOWING A MEAL   MULTIPLE VITAMINS-MINERALS (CENTRUM CARDIO) TABS    Take 1 tablet by mouth 2 (two) times daily.   NITROGLYCERIN  (NITROSTAT ) 0.4  MG SL TABLET    Place 1 tablet (0.4 mg total) under the tongue every 5 (five) minutes as needed for chest pain.   OXYCODONE  (OXY IR/ROXICODONE ) 5 MG IMMEDIATE RELEASE TABLET    Take 1 tablet (5 mg total) by mouth every 6 (six) hours as needed for up to 12 doses for moderate pain (pain score 4-6).   PANTOPRAZOLE  (PROTONIX ) 40 MG TABLET    TAKE 1 TABLET(40 MG) BY MOUTH DAILY   PIPERACILLIN-TAZOBACTAM (ZOSYN) IVPB    Inject 13.5 g into the vein as directed. Over a 24 hour period   POLYETHYLENE GLYCOL (MIRALAX  / GLYCOLAX ) 17 G PACKET    Take 17 g by mouth 2 (two) times daily.   REPATHA  SURECLICK 140 MG/ML SOAJ    ADMINISTER 1 ML UNDER THE SKIN EVERY 14 DAYS   SULFASALAZINE  (AZULFIDINE ) 500 MG EC TABLET    TAKE 2 TABLETS BY MOUTH TWICE DAILY   TAMSULOSIN  (FLOMAX ) 0.4 MG CAPS CAPSULE    Take 1 capsule (0.4 mg total) by mouth daily after supper.   THEOPHYLLINE  (THEODUR) 300 MG 12 HR TABLET    TAKE 1 TABLET BY MOUTH TWICE DAILY   VEDOLIZUMAB  (ENTYVIO ) 300 MG INJECTION    Inject 300 mg into the vein every 8 (eight) weeks.  Modified Medications   No medications on file  Discontinued Medications   No medications on file    Physical Exam:  Vitals:   01/16/24 1114  BP: 120/82  Pulse: (!) 117  Temp: 98.5 F (36.9 C)  SpO2: 97%  Weight: 169 lb (76.7 kg)  Height: 5' 8 (1.727 m)   Body mass index is 25.7 kg/m. Wt Readings from Last 3 Encounters:  01/16/24 169 lb (76.7 kg)  10/24/23 158 lb (71.7 kg)  10/11/23 158 lb (71.7 kg)    Physical Exam Vitals reviewed.  Constitutional:      General: He is not in acute distress. HENT:     Head: Normocephalic.     Ears:     Comments: HOH Eyes:     General:        Right eye: No discharge.        Left eye: No discharge.  Cardiovascular:     Rate and Rhythm: Tachycardia present.  Pulses: Normal pulses.     Heart sounds: Normal heart sounds.     Comments: Apical 92 Pulmonary:     Effort: Pulmonary effort is normal.     Breath sounds: Normal  breath sounds.  Abdominal:     General: Bowel sounds are normal. There is no distension.     Palpations: Abdomen is soft.     Tenderness: There is no abdominal tenderness.  Musculoskeletal:     Cervical back: Neck supple.     Right lower leg: No edema.     Left lower leg: No edema.  Skin:    General: Skin is warm.     Capillary Refill: Capillary refill takes less than 2 seconds.  Neurological:     General: No focal deficit present.     Mental Status: He is alert and oriented to person, place, and time.  Psychiatric:        Mood and Affect: Mood normal.     Labs reviewed: Basic Metabolic Panel: Recent Labs    09/28/23 0547 09/29/23 0708 09/30/23 0437  NA 132* 132* 134*  K 3.6 3.6 3.4*  CL 98 98 100  CO2 24 23 22   GLUCOSE 163* 139* 125*  BUN 10 18 19   CREATININE 0.86 0.86 0.70  CALCIUM  9.1 9.0 8.5*   Liver Function Tests: Recent Labs    09/17/23 1348  AST 18  ALT 20  ALKPHOS 87  BILITOT 0.7  PROT 7.2  ALBUMIN  4.2   No results for input(s): LIPASE, AMYLASE in the last 8760 hours. No results for input(s): AMMONIA in the last 8760 hours. CBC: Recent Labs    09/28/23 1753 09/29/23 0708 09/30/23 0437  WBC 18.3* 12.2* 9.5  HGB 11.4* 10.6* 9.4*  HCT 35.6* 32.0* 28.5*  MCV 90.6 89.9 90.2  PLT 375 327 356   Lipid Panel: Recent Labs    03/07/23 1145  CHOL 105  HDL 40  LDLCALC 47  TRIG 92  CHOLHDL 2.6   TSH: No results for input(s): TSH in the last 8760 hours. A1C: Lab Results  Component Value Date   HGBA1C 6.2 (H) 09/12/2023     Assessment/Plan 1. Immunization due (Primary) - Flu vaccine trivalent PF, 6mos and older(Flulaval,Afluria,Fluarix,Fluzone)  2. History of prostate cancer - s/p prostatectomy 09/26/2023> Dr. Shane - improved incontinence - intermittent dysuria x 6 weeks - not on medication   3. Left leg weakness - post operative complication after prostatectomy> nerve involvement?  - no recent falls - WBAT - consider  PT if gait worsens   4. Ulcerative colitis without complications, unspecified location [facility]) - followed by GI - cont Entyvio  infusions (no recent infusions due to prostatectomy), sulfasalazine   5. Atherosclerosis of native coronary artery of native heart without angina pectoris - followed by cardiology - LDL 36 01/17/2024 - on Repatha  - stopped asa and Plavix  due to hematuria - plan to restart after prostate surgery - CBC with Differential/Platelet - metoprolol  succinate (TOPROL -XL) 50 MG 24 hr tablet; Take 1 tablet (50 mg total) by mouth daily. Take with or immediately following a meal.  Dispense: 90 tablet; Refill: 2 - Lipid Panel  6. Type 2 diabetes mellitus with diabetic polyneuropathy, without long-term current use of insulin  (HCC) - uncontrolled> now 7.2 was 6.2 - due for eye exam  - will increase metformin  to 1000 mg po BID  - Complete Metabolic Panel with eGFR - Hemoglobin A1c  7. Tachycardia - HR 90's - ran out of metoprolol  - advised to restart -  recheck TSH  - TSH  8. Sensorineural hearing loss (SNHL) of both ears - referral made to Dr. Thaddeus  Total time: 34 minutes. Greater than 50% of total time spent doing patient education regarding health maintenance, prostate cancer, T2DM, hearing loss, leg weakness/falls safety including symptom/medication management.    Next appt: Visit date not found  Arilyn Brierley Gil BODILY  [facility] [phone]

## 2024-01-17 ENCOUNTER — Ambulatory Visit: Payer: Self-pay | Admitting: Orthopedic Surgery

## 2024-01-17 ENCOUNTER — Telehealth: Payer: Self-pay | Admitting: Pharmacy Technician

## 2024-01-17 DIAGNOSIS — K5190 Ulcerative colitis, unspecified, without complications: Secondary | ICD-10-CM

## 2024-01-17 DIAGNOSIS — E1142 Type 2 diabetes mellitus with diabetic polyneuropathy: Secondary | ICD-10-CM

## 2024-01-17 LAB — COMPREHENSIVE METABOLIC PANEL WITH GFR
AG Ratio: 1.5 (calc) (ref 1.0–2.5)
ALT: 13 U/L (ref 9–46)
AST: 12 U/L (ref 10–35)
Albumin: 4.5 g/dL (ref 3.6–5.1)
Alkaline phosphatase (APISO): 106 U/L (ref 35–144)
BUN: 16 mg/dL (ref 7–25)
CO2: 25 mmol/L (ref 20–32)
Calcium: 10.2 mg/dL (ref 8.6–10.3)
Chloride: 100 mmol/L (ref 98–110)
Creat: 0.87 mg/dL (ref 0.70–1.35)
Globulin: 3.1 g/dL (ref 1.9–3.7)
Glucose, Bld: 124 mg/dL — ABNORMAL HIGH (ref 65–99)
Potassium: 4.2 mmol/L (ref 3.5–5.3)
Sodium: 137 mmol/L (ref 135–146)
Total Bilirubin: 0.5 mg/dL (ref 0.2–1.2)
Total Protein: 7.6 g/dL (ref 6.1–8.1)
eGFR: 97 mL/min/1.73m2 (ref >=60)

## 2024-01-17 LAB — CBC WITH DIFFERENTIAL/PLATELET
Absolute Lymphocytes: 1867 {cells}/uL (ref 850–3900)
Absolute Monocytes: 689 {cells}/uL (ref 200–950)
Basophils Absolute: 128 {cells}/uL (ref 0–200)
Basophils Relative: 1.8 %
Eosinophils Absolute: 518 {cells}/uL — ABNORMAL HIGH (ref 15–500)
Eosinophils Relative: 7.3 %
HCT: 40.6 % (ref 38.5–50.0)
Hemoglobin: 13 g/dL — ABNORMAL LOW (ref 13.2–17.1)
MCH: 26.3 pg — ABNORMAL LOW (ref 27.0–33.0)
MCHC: 32 g/dL (ref 32.0–36.0)
MCV: 82 fL (ref 80.0–100.0)
MPV: 8.9 fL (ref 7.5–12.5)
Monocytes Relative: 9.7 %
Neutro Abs: 3898 {cells}/uL (ref 1500–7800)
Neutrophils Relative %: 54.9 %
Platelets: 327 Thousand/uL (ref 140–400)
RBC: 4.95 Million/uL (ref 4.20–5.80)
RDW: 14.7 % (ref 11.0–15.0)
Total Lymphocyte: 26.3 %
WBC: 7.1 Thousand/uL (ref 3.8–10.8)

## 2024-01-17 LAB — LIPID PANEL
Cholesterol: 99 mg/dL (ref <200)
HDL: 46 mg/dL (ref >=40)
LDL Cholesterol (Calc): 36 mg/dL
Non-HDL Cholesterol (Calc): 53 mg/dL (ref <130)
Total CHOL/HDL Ratio: 2.2 (calc) (ref <5.0)
Triglycerides: 91 mg/dL (ref <150)

## 2024-01-17 LAB — HEMOGLOBIN A1C
Hgb A1c MFr Bld: 7.2 % — ABNORMAL HIGH (ref <5.7)
Mean Plasma Glucose: 160 mg/dL
eAG (mmol/L): 8.9 mmol/L

## 2024-01-17 LAB — TSH: TSH: 2.53 m[IU]/L (ref 0.40–4.50)

## 2024-01-17 MED ORDER — METFORMIN HCL ER (OSM) 1000 MG PO TB24: 1000.0000 mg | ORAL_TABLET | Freq: Two times a day (BID) | ORAL | 2 refills | Status: AC

## 2024-01-17 NOTE — Addendum Note (Signed)
 Addended by: Noeh Sparacino N on: 01/17/2024 08:40 AM   Modules accepted: Orders

## 2024-01-17 NOTE — Telephone Encounter (Signed)
 Attempted to reach patient. His phone goes straight to vm, I was able to leave a vm for patient to return call.   Lab & stool study orders in epic.

## 2024-01-17 NOTE — Telephone Encounter (Signed)
 [name], Patient stopped entyvio  treatment due to he was receiving cancer treatment.  He is now ready to restart the entyvio . Would you like to restart the induction process due to his long break or resume the q8wk dosing?

## 2024-01-20 NOTE — Telephone Encounter (Signed)
 Called & spoke with patient. Patient informed me that he returned call last week but the hold was too long. Patient states that he is not currently having any UC symptoms. Patient denied diarrhea, blood or mucous in the stool. Patient has infusion appt scheduled for 01/29/24, he is aware we will continue at maintenance dose due to being asymptomatic. Patient has been advised to stop by lab for blood work & stool test prior to his infusion appt. Made patient aware that we will check Entyvio  levels prior to his 3rd infusion. Patient has f/u scheduled in December. Patient had no concerns at the end of the call.

## 2024-01-20 NOTE — Telephone Encounter (Signed)
 [name], patient will resume Entyvio  at maintenance dose. Can we add a note to have Entyvio  trough & antibodies drawn prior to 3rd infusion?

## 2024-01-20 NOTE — Telephone Encounter (Signed)
 [place], We will get the patient scheduled as soon as possible. Thanks.

## 2024-01-29 ENCOUNTER — Other Ambulatory Visit (INDEPENDENT_AMBULATORY_CARE_PROVIDER_SITE_OTHER)

## 2024-01-29 ENCOUNTER — Ambulatory Visit

## 2024-01-29 DIAGNOSIS — K5190 Ulcerative colitis, unspecified, without complications: Secondary | ICD-10-CM

## 2024-01-30 ENCOUNTER — Other Ambulatory Visit: Payer: Self-pay | Admitting: *Deleted

## 2024-01-30 ENCOUNTER — Other Ambulatory Visit

## 2024-01-30 ENCOUNTER — Ambulatory Visit: Payer: Self-pay | Admitting: Physician Assistant

## 2024-01-30 DIAGNOSIS — K5190 Ulcerative colitis, unspecified, without complications: Secondary | ICD-10-CM | POA: Diagnosis not present

## 2024-01-30 LAB — COMPREHENSIVE METABOLIC PANEL WITH GFR
AG Ratio: 1.6 (calc) (ref 1.0–2.5)
ALT: 12 U/L (ref 9–46)
AST: 15 U/L (ref 10–35)
Albumin: 4.5 g/dL (ref 3.6–5.1)
Alkaline phosphatase (APISO): 86 U/L (ref 35–144)
BUN/Creatinine Ratio: 28 (calc) — ABNORMAL HIGH (ref 6–22)
BUN: 19 mg/dL (ref 7–25)
CO2: 23 mmol/L (ref 20–32)
Calcium: 10 mg/dL (ref 8.6–10.3)
Chloride: 106 mmol/L (ref 98–110)
Creat: 0.69 mg/dL — ABNORMAL LOW (ref 0.70–1.35)
Globulin: 2.9 g/dL (ref 1.9–3.7)
Glucose, Bld: 127 mg/dL — ABNORMAL HIGH (ref 65–99)
Potassium: 4.7 mmol/L (ref 3.5–5.3)
Sodium: 141 mmol/L (ref 135–146)
Total Bilirubin: 0.5 mg/dL (ref 0.2–1.2)
Total Protein: 7.4 g/dL (ref 6.1–8.1)
eGFR: 104 mL/min/1.73m2 (ref >=60)

## 2024-01-30 LAB — SEDIMENTATION RATE: Sed Rate: 36 mm/h — ABNORMAL HIGH (ref 0–20)

## 2024-01-30 LAB — C-REACTIVE PROTEIN: CRP: 25.5 mg/L — ABNORMAL HIGH (ref <8.0)

## 2024-01-30 LAB — CBC
HCT: 38.2 % — ABNORMAL LOW (ref 38.5–50.0)
Hemoglobin: 12.4 g/dL — ABNORMAL LOW (ref 13.2–17.1)
MCH: 26.8 pg — ABNORMAL LOW (ref 27.0–33.0)
MCHC: 32.5 g/dL (ref 32.0–36.0)
MCV: 82.7 fL (ref 80.0–100.0)
MPV: 8.8 fL (ref 7.5–12.5)
Platelets: 421 Thousand/uL — ABNORMAL HIGH (ref 140–400)
RBC: 4.62 Million/uL (ref 4.20–5.80)
RDW: 16 % — ABNORMAL HIGH (ref 11.0–15.0)
WBC: 9 Thousand/uL (ref 3.8–10.8)

## 2024-02-04 ENCOUNTER — Other Ambulatory Visit: Payer: Self-pay | Admitting: Orthopedic Surgery

## 2024-02-04 DIAGNOSIS — I2510 Atherosclerotic heart disease of native coronary artery without angina pectoris: Secondary | ICD-10-CM

## 2024-02-04 LAB — CALPROTECTIN, FECAL: Calprotectin, Fecal: 835 ug/g — ABNORMAL HIGH (ref 0–120)

## 2024-02-04 LAB — SPECIMEN STATUS REPORT

## 2024-02-04 MED ORDER — CLOPIDOGREL BISULFATE 75 MG PO TABS
75.0000 mg | ORAL_TABLET | Freq: Every day | ORAL | 2 refills | Status: AC
Start: 2024-02-04 — End: ?

## 2024-02-04 NOTE — Telephone Encounter (Signed)
 Plavix  is Not in patient's current medication list. Called and spoke with patient and he stated that he is still taking it and it has Greig Cluster, NP name on the Rx bottle.   Patient is requesting a refill.  Please Advise.

## 2024-02-04 NOTE — Telephone Encounter (Signed)
 Copied from CRM [id number]. Topic: Clinical - Prescription Issue >> Feb 04, 2024  3:00 PM Shamecia H wrote: Reason for CRM: Patient called about his denial for clopidogrel  (PLAVIX ) 75 MG tablet and in the pharmacy denial it states for him to contact his provider. He is wanting to know the reason why and this medicine is his blood thinners which he needs. Could you assist? Patients callback number is [phone].

## 2024-02-04 NOTE — Telephone Encounter (Signed)
 Patient notified

## 2024-02-04 NOTE — Telephone Encounter (Signed)
 Medication refilled. Plavix  was held a brief time after recent surgery.

## 2024-02-10 ENCOUNTER — Other Ambulatory Visit: Payer: Self-pay | Admitting: Internal Medicine

## 2024-02-10 DIAGNOSIS — K5180 Other ulcerative colitis without complications: Secondary | ICD-10-CM

## 2024-02-12 ENCOUNTER — Ambulatory Visit (INDEPENDENT_AMBULATORY_CARE_PROVIDER_SITE_OTHER)

## 2024-02-12 VITALS — BP 111/73 | HR 97 | Temp 98.0°F | Resp 18 | Ht 70.0 in | Wt 168.2 lb

## 2024-02-12 DIAGNOSIS — K5190 Ulcerative colitis, unspecified, without complications: Secondary | ICD-10-CM | POA: Diagnosis not present

## 2024-02-12 MED ORDER — VEDOLIZUMAB 300 MG IV SOLR
300.0000 mg | Freq: Once | INTRAVENOUS | Status: AC
  Administered 2024-02-12: 300 mg via INTRAVENOUS
  Filled 2024-02-12: qty 5

## 2024-02-12 NOTE — Progress Notes (Signed)
 Diagnosis: , Ulcerative Colitis  Provider:  Lonna Coder MD  Procedure: IV Infusion  IV Type: Peripheral, IV Location: L Forearm  , Entyvio  (Vedolizumab ), Dose: 300 mg  Infusion Start Time: 1106  Infusion Stop Time: 1140  Post Infusion IV Care: Peripheral IV Discontinued  Discharge: Condition: Good, Destination: Home . AVS Declined  Performed by:  Raynor Calcaterra, RN

## 2024-02-16 IMAGING — DX DG ABDOMEN 1V
2 series · 2 of 2 positions shown · non-contrast
Comparison: None.

CLINICAL DATA: Evaluate stool burden

EXAM:
ABDOMEN - 1 VIEW

[abdomen kub (1 of 2)]
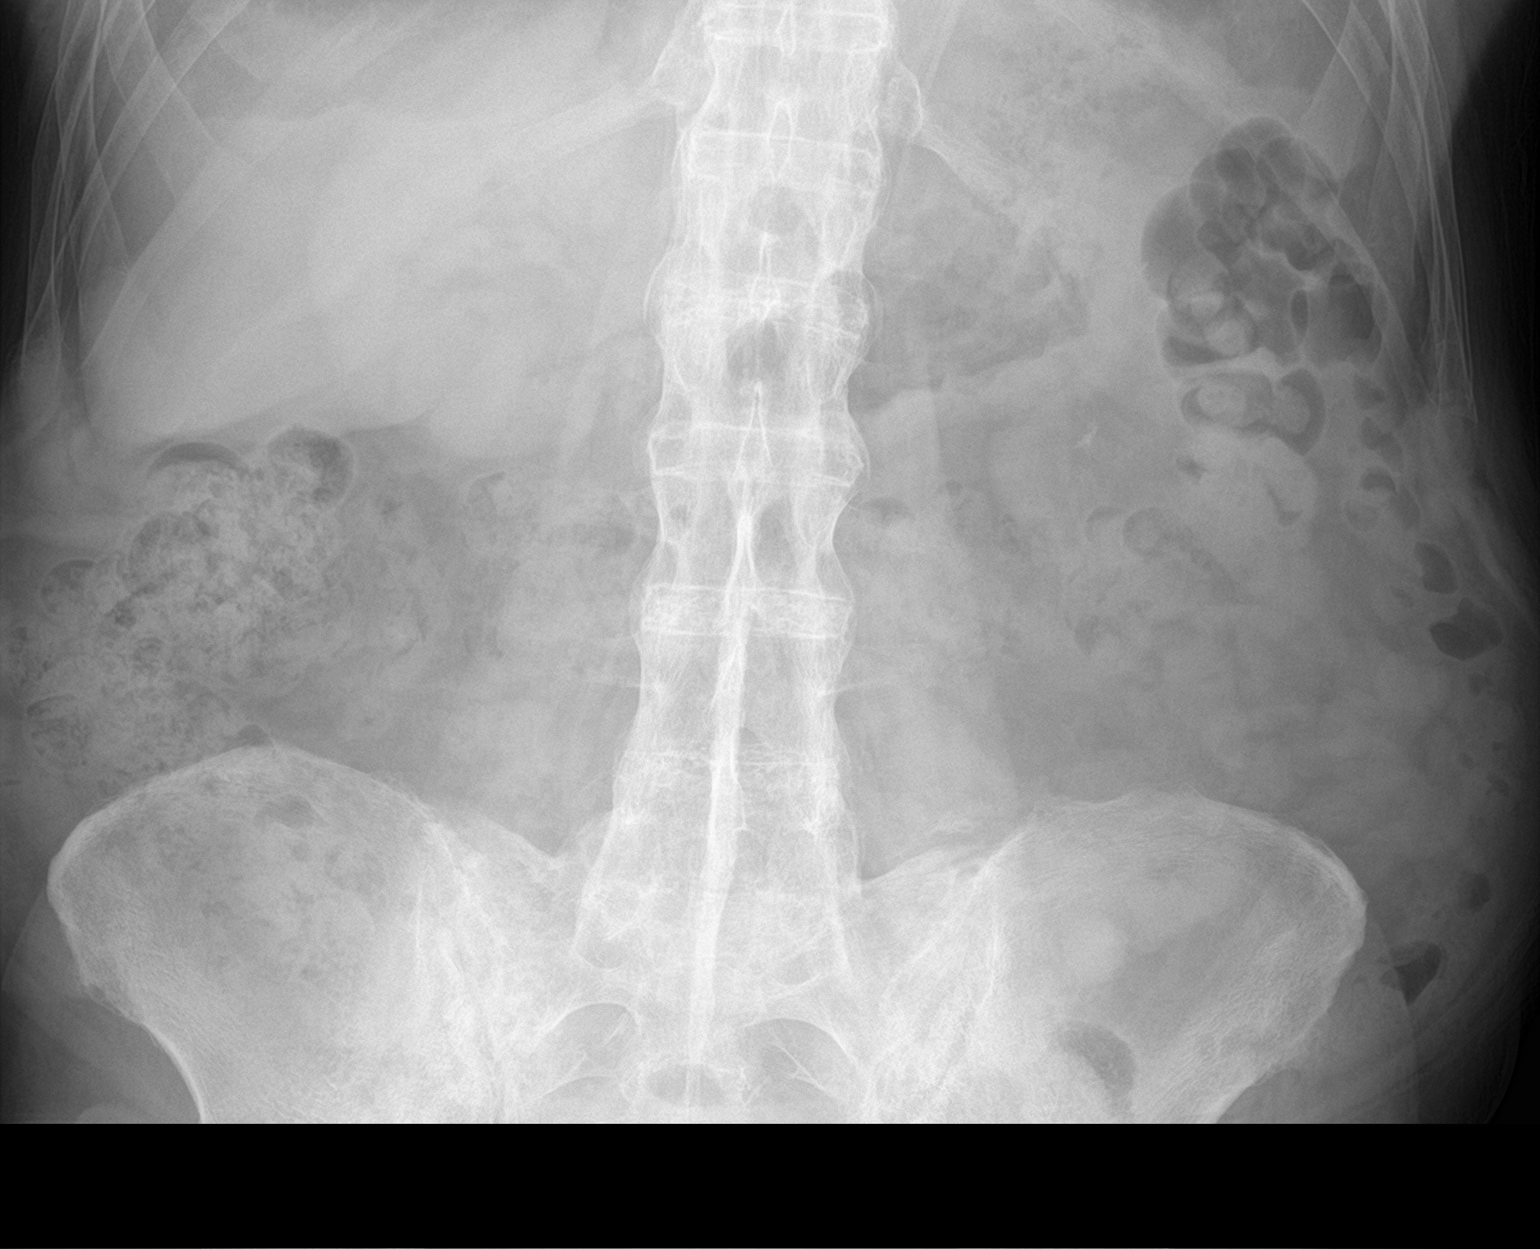

[abdomen kub (2 of 2)]
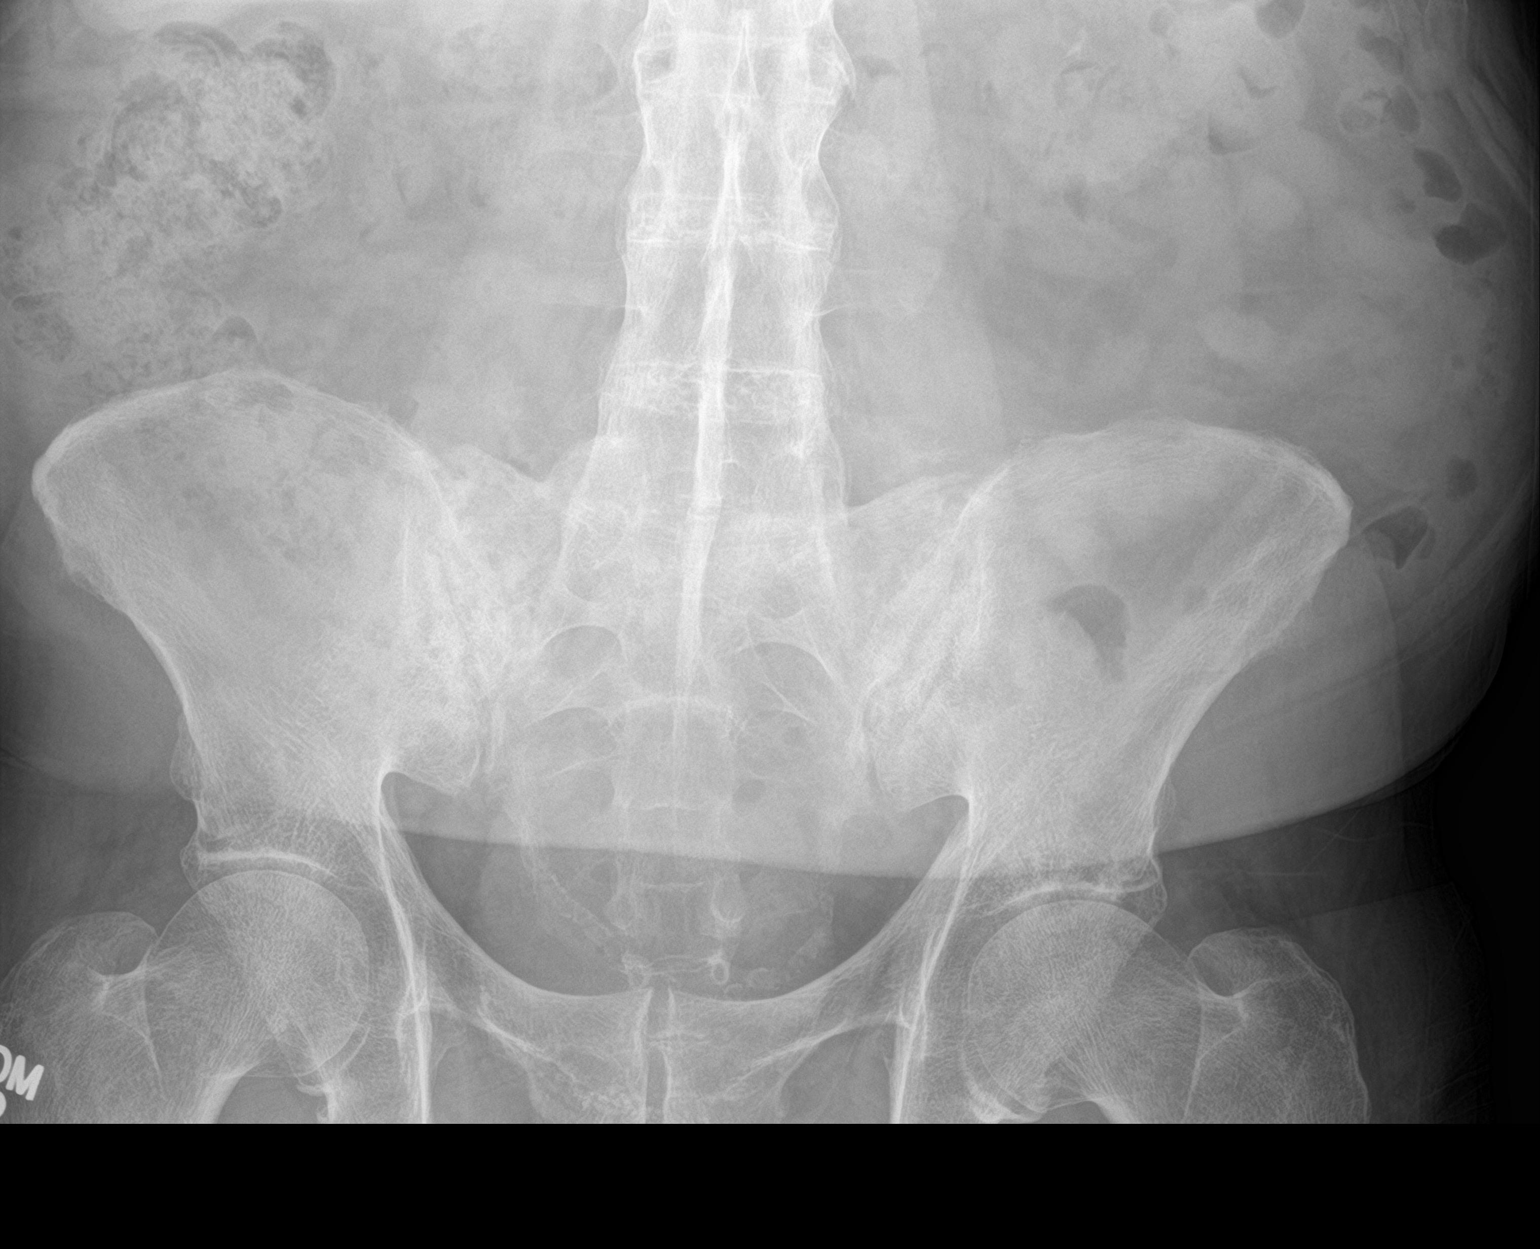

[2 of 2 positions shown; findings below may reference images not displayed]

FINDINGS: Moderate stool burden throughout the colon, most pronounced in the
right colon and transverse colon. There is a non obstructive bowel
gas pattern. No supine evidence of free air. No organomegaly or
suspicious calcification. No acute bony abnormality.
IMPRESSION: Moderate stool burden in the right colon and transverse colon. No
acute findings.

## 2024-02-27 ENCOUNTER — Ambulatory Visit (INDEPENDENT_AMBULATORY_CARE_PROVIDER_SITE_OTHER): Admitting: Orthopedic Surgery

## 2024-02-27 ENCOUNTER — Encounter: Payer: Self-pay | Admitting: Orthopedic Surgery

## 2024-02-27 DIAGNOSIS — J029 Acute pharyngitis, unspecified: Secondary | ICD-10-CM | POA: Diagnosis not present

## 2024-02-27 DIAGNOSIS — R051 Acute cough: Secondary | ICD-10-CM | POA: Diagnosis not present

## 2024-02-27 DIAGNOSIS — R6889 Other general symptoms and signs: Secondary | ICD-10-CM | POA: Diagnosis not present

## 2024-02-27 LAB — POCT INFLUENZA A/B
Influenza A, POC: NEGATIVE
Influenza B, POC: NEGATIVE

## 2024-02-27 MED ORDER — PREDNISONE 20 MG PO TABS: 40.0000 mg | ORAL_TABLET | Freq: Every day | ORAL | 0 refills | Status: AC

## 2024-02-27 MED ORDER — DOXYCYCLINE HYCLATE 100 MG PO TABS: 100.0000 mg | ORAL_TABLET | Freq: Two times a day (BID) | ORAL | 0 refills | Status: AC

## 2024-02-27 NOTE — Progress Notes (Signed)
 Careteam: Patient Care Team: Gil Greig BRAVO, NP as PCP - General (Adult Health Nurse Practitioner) Elmira Newman PARAS, MD as PCP - Cardiology (Cardiology)  Seen by: Greig Gil, AGNP-C  PLACE OF SERVICE:  [facility]  Advanced Directive information    Allergies[1]  Chief Complaint  Patient presents with   Cough     HPI: Patient is a 63 y.o. male seen today for acute visit due to acute cough and sore throat.   Discussed the use of AI scribe software for clinical note transcription with the patient, who gave verbal consent to proceed.  History of Present Illness   [name] is a 63 year old male who presents with a persistent cough and sore throat for two weeks.  Two weeks ago, he began experiencing a sore throat, sneezing and cough. The sore throat persisted into the next day, accompanied by the onset of a cough. The sore throat improved, but the cough remained. He also experienced sneezing for a few days.  Yesterday, he woke up with a severe sore throat, described as 'the worst sore throat I've ever had,' without any gradual buildup. Today, the sore throat is better. No fever, but he has experienced mild body aches. Initially, he felt the symptoms transitioned from a head cold to a chest cold.  He has not been in contact with anyone sick, except for his wife, and has avoided going to church. He does not work anymore. He has taken Nyquil and sore throat lozenges for symptom relief. He received a flu shot approximately six weeks to two months ago and has not tested for COVID at home.  H/o asthma. He reports shortness of breath at the beginning of his illness, for which he took extra theophylline  at night for three to four nights. He has been using his inhaler more frequently than usual. No chest pain, wheezing.   He is able to swallow foods and fluids without difficulty. He does not have any known allergies to antibiotics or other medicines.       Review of Systems:   Review of Systems  Constitutional:  Positive for chills and malaise/fatigue. Negative for fever.  HENT:  Positive for congestion and sore throat. Negative for ear pain and sinus pain.   Respiratory:  Positive for cough, sputum production and shortness of breath. Negative for wheezing.   Cardiovascular:  Negative for chest pain.  Gastrointestinal:  Negative for nausea and vomiting.  Musculoskeletal:  Negative for myalgias.  Neurological:  Negative for dizziness and headaches.  Psychiatric/Behavioral:  Negative for depression. The patient is not nervous/anxious.     Past Medical History:  Diagnosis Date   Anemia    Ankylosing spondylitis (HCC)    Arthritis    Asthma    SINCE AGE 83   Colitis, ulcerative (HCC)    Coronary artery disease    Bare metal LAD stent 10/08   Coronary atherosclerosis of native coronary artery 12/25/2012   Depression    Diabetes mellitus without complication [facility])    ED (erectile dysfunction)    Elevated prostate specific antigen (PSA) 12/25/2012   Esophageal reflux 12/25/2012   Extrinsic asthma, unspecified 12/25/2012   Fibromyalgia    GERD (gastroesophageal reflux disease)    History of heart attack    Hypercalcemia 12/25/2012   Hyperlipidemia    Hypertension    Left sided ulcerative (chronic) colitis (HCC) 12/25/2012   Mixed hyperlipidemia 12/25/2012   Myocardial infarction (HCC)    Pneumonia  as a child   Prostate cancer (HCC) 06/06/2023   Pure hypercholesterolemia 12/25/2012   Skin cancer of eyelid    TMJ (dislocation of temporomandibular joint)    Past Surgical History:  Procedure Laterality Date   CATARACT EXTRACTION, BILATERAL     COLONOSCOPY     CORONARY/GRAFT ACUTE MI REVASCULARIZATION N/A 11/23/2019   Procedure: Coronary/Graft Acute MI Revascularization;  Surgeon: Wonda Sharper, MD;  Location: [facility] INVASIVE CV LAB;  Service: Cardiovascular;  Laterality: N/A;   EYE SURGERY Left    Inital lesion bx of eyelid   EYE SURGERY Left     Left eyelid, Moh's excision for skin cancer   LEFT HEART CATH AND CORONARY ANGIOGRAPHY N/A 11/23/2019   Procedure: LEFT HEART CATH AND CORONARY ANGIOGRAPHY;  Surgeon: Wonda Sharper, MD;  Location: [facility] INVASIVE CV LAB;  Service: Cardiovascular;  Laterality: N/A;   PROSTATE BIOPSY N/A 06/06/2023   Procedure: BIOPSY, PROSTATE, RECTAL APPROACH, WITH US  GUIDANCE;  Surgeon: Shane Steffan BROCKS, MD;  Location: [facility] ORS;  Service: Urology;  Laterality: N/A;   RADIOLOGY WITH ANESTHESIA N/A 08/27/2023   Procedure: MRI WITH ANESTHESIA;  Surgeon: Radiologist, Medication, MD;  Location: [facility] OR;  Service: Radiology;  Laterality: N/A;  MRI OF PROSTATE WITH AND WITHOUT CONTRAST   ROBOT ASSISTED LAPAROSCOPIC RADICAL PROSTATECTOMY N/A 09/26/2023   Procedure: PROSTATECTOMY, RADICAL, ROBOT-ASSISTED, LAPAROSCOPIC;  Surgeon: Shane Steffan BROCKS, MD;  Location: [facility];  Service: Urology;  Laterality: N/A;   skull fracture after falling down stairs  1983   had craniectomy to remove a piece of bone.   TONSILLECTOMY     TRANSURETHRAL RESECTION OF BLADDER TUMOR N/A 06/06/2023   Procedure: Removal of bladder stone   ,Right ureteroscopy diagnostic cytology obtained. , Bladder biopsy near left ureteral orifice , Transurethral resection of prostate removal of median lobe. ,  Prostate biopsy, Urethral dilation;  Surgeon: Shane Steffan BROCKS, MD;  Location: [facility];  Service: Urology;  Laterality: N/A;  BILATERAL RETROGRADE PYELOGRAM, POSSIBLE GEMCITABINE INSTILLATION   Social History:   reports that he has never smoked. He has never been exposed to tobacco smoke. He has quit using smokeless tobacco.  His smokeless tobacco use included snuff. He reports current alcohol use. He reports that he does not use drugs.  Family History  Problem Relation Age of Onset   Hypertension Mother    Lung cancer Father    Lung cancer Sister    GER disease Sister    Brain cancer Sister    Diabetes Maternal Grandmother    Emphysema Maternal  Grandfather    COPD Maternal Grandfather    Pancreatic cancer Neg Hx    Stomach cancer Neg Hx    Colon cancer Neg Hx    Esophageal cancer Neg Hx    Colon polyps Neg Hx    Heart disease Neg Hx     Medications: Patient's Medications  New Prescriptions   DOXYCYCLINE (VIBRA-TABS) 100 MG TABLET    Take 1 tablet (100 mg total) by mouth 2 (two) times daily for 7 days.   PREDNISONE  (DELTASONE ) 20 MG TABLET    Take 2 tablets (40 mg total) by mouth daily with breakfast for 5 days.  Previous Medications   ALBUTEROL  (VENTOLIN  HFA) 108 (90 BASE) MCG/ACT INHALER    INHALE 2 PUFFS INTO THE LUNGS EVERY 6 HOURS AS NEEDED FOR WHEEZING   CLOPIDOGREL  (PLAVIX ) 75 MG TABLET    Take 1 tablet (75 mg total) by mouth daily.   CYCLOBENZAPRINE  (FLEXERIL ) 5 MG TABLET  Take 1 tablet (5 mg total) by mouth at bedtime.   GABAPENTIN  (NEURONTIN ) 100 MG CAPSULE    Take 200 mg by mouth at bedtime.   HYOSCYAMINE  (ANASPAZ ) 0.125 MG TBDP DISINTERGRATING TABLET    Place 1 tablet (0.125 mg total) under the tongue every 6 (six) hours as needed for up to 20 doses.   ICOSAPENT  ETHYL (VASCEPA ) 1 G CAPSULE    Take 2 capsules (2 g total) by mouth 2 (two) times daily.   LISINOPRIL  (ZESTRIL ) 2.5 MG TABLET    TAKE 1 TABLET(2.5 MG) BY MOUTH DAILY   LORATADINE  (CLARITIN ) 10 MG TABLET    Take 10 mg by mouth daily.   METFORMIN  (FORTAMET ) 1000 MG (OSM) 24 HR TABLET    Take 1 tablet (1,000 mg total) by mouth 2 (two) times daily with a meal.   METOPROLOL  SUCCINATE (TOPROL -XL) 50 MG 24 HR TABLET    Take 1 tablet (50 mg total) by mouth daily. Take with or immediately following a meal.   MULTIPLE VITAMINS-MINERALS (CENTRUM CARDIO) TABS    Take 1 tablet by mouth 2 (two) times daily.   NITROGLYCERIN  (NITROSTAT ) 0.4 MG SL TABLET    Place 1 tablet (0.4 mg total) under the tongue every 5 (five) minutes as needed for chest pain.   OXYCODONE  (OXY IR/ROXICODONE ) 5 MG IMMEDIATE RELEASE TABLET    Take 1 tablet (5 mg total) by mouth every 6 (six) hours as  needed for up to 12 doses for moderate pain (pain score 4-6).   PANTOPRAZOLE  (PROTONIX ) 40 MG TABLET    TAKE 1 TABLET(40 MG) BY MOUTH DAILY   PIPERACILLIN-TAZOBACTAM (ZOSYN ) IVPB    Inject 13.5 g into the vein as directed. Over a 24 hour period   POLYETHYLENE GLYCOL (MIRALAX  / GLYCOLAX ) 17 G PACKET    Take 17 g by mouth 2 (two) times daily.   REPATHA  SURECLICK 140 MG/ML SOAJ    ADMINISTER 1 ML UNDER THE SKIN EVERY 14 DAYS   SULFASALAZINE  (AZULFIDINE ) 500 MG EC TABLET    TAKE 2 TABLETS BY MOUTH TWICE DAILY   TAMSULOSIN  (FLOMAX ) 0.4 MG CAPS CAPSULE    Take 1 capsule (0.4 mg total) by mouth daily after supper.   THEOPHYLLINE  (THEODUR) 300 MG 12 HR TABLET    TAKE 1 TABLET BY MOUTH TWICE DAILY   VEDOLIZUMAB  (ENTYVIO ) 300 MG INJECTION    Inject 300 mg into the vein every 8 (eight) weeks.  Modified Medications   No medications on file  Discontinued Medications   No medications on file    Physical Exam:  Vitals:   02/27/24 1118  BP: 120/80  Pulse: (!) 105  Temp: 98.6 F (37 C)  SpO2: 97%  Weight: 168 lb 6.4 oz (76.4 kg)  Height: 5' 10 (1.778 m)   Body mass index is 24.16 kg/m. Wt Readings from Last 3 Encounters:  02/27/24 168 lb 6.4 oz (76.4 kg)  02/12/24 168 lb 3.2 oz (76.3 kg)  01/16/24 169 lb (76.7 kg)    Physical Exam Vitals reviewed.  Constitutional:      General: He is not in acute distress. HENT:     Head: Normocephalic.     Right Ear: Tympanic membrane normal.     Left Ear: Tympanic membrane normal.     Nose: Rhinorrhea present.     Mouth/Throat:     Mouth: Mucous membranes are moist.     Pharynx: No posterior oropharyngeal erythema.  Eyes:     General:  Right eye: No discharge.        Left eye: No discharge.  Cardiovascular:     Rate and Rhythm: Normal rate and regular rhythm.     Pulses: Normal pulses.     Heart sounds: Normal heart sounds.  Pulmonary:     Effort: Pulmonary effort is normal. No respiratory distress.     Breath sounds: Examination of  the right-upper field reveals rhonchi. Examination of the left-upper field reveals rhonchi. Examination of the right-middle field reveals rhonchi. Examination of the left-middle field reveals rhonchi. Rhonchi present. No wheezing or rales.  Abdominal:     Palpations: Abdomen is soft.  Musculoskeletal:        General: Normal range of motion.     Cervical back: Neck supple.  Skin:    General: Skin is warm.     Capillary Refill: Capillary refill takes less than 2 seconds.  Neurological:     General: No focal deficit present.     Mental Status: He is alert and oriented to person, place, and time.  Psychiatric:        Mood and Affect: Mood normal.     Labs reviewed: Basic Metabolic Panel: Recent Labs    09/30/23 0437 01/16/24 1211 01/29/24 1256  NA 134* 137 141  K 3.4* 4.2 4.7  CL 100 100 106  CO2 22 25 23   GLUCOSE 125* 124* 127*  BUN 19 16 19   CREATININE 0.70 0.87 0.69*  CALCIUM  8.5* 10.2 10.0  TSH  --  2.53  --    Liver Function Tests: Recent Labs    09/17/23 1348 01/16/24 1211 01/29/24 1256  AST 18 12 15   ALT 20 13 12   ALKPHOS 87  --   --   BILITOT 0.7 0.5 0.5  PROT 7.2 7.6 7.4  ALBUMIN  4.2  --   --    No results for input(s): LIPASE, AMYLASE in the last 8760 hours. No results for input(s): AMMONIA in the last 8760 hours. CBC: Recent Labs    09/30/23 0437 01/16/24 1211 01/29/24 1256  WBC 9.5 7.1 9.0  NEUTROABS  --  3,898  --   HGB 9.4* 13.0* 12.4*  HCT 28.5* 40.6 38.2*  MCV 90.2 82.0 82.7  PLT 356 327 421*   Lipid Panel: Recent Labs    03/07/23 1145 01/16/24 1211  CHOL 105 99  HDL 40 46  LDLCALC 47 36  TRIG 92 91  CHOLHDL 2.6 2.2   TSH: Recent Labs    01/16/24 1211  TSH 2.53   A1C: Lab Results  Component Value Date   HGBA1C 7.2 (H) 01/16/2024     Assessment/Plan 1. Acute cough (Primary) - onset x 2 weeks - rapid flu negative - unable to collect rapid covid> will send out - rhonchi with upper/middle lobes, increased sob -  will start antibiotic due to unresolved symptoms, h/o asthma - will start prednisone  for sore throat/cough - if no improvement recommend CXR  - POCT Influenza A/B - SARS-COV-2 RNA,(COVID-19) QUAL NAAT - doxycycline (VIBRA-TABS) 100 MG tablet; Take 1 tablet (100 mg total) by mouth 2 (two) times daily for 7 days.  Dispense: 14 tablet; Refill: 0 - predniSONE  (DELTASONE ) 20 MG tablet; Take 2 tablets (40 mg total) by mouth daily with breakfast for 5 days.  Dispense: 10 tablet; Refill: 0  2. Flu-like symptoms - see above - SARS-COV-2 RNA,(COVID-19) QUAL NAAT  3. Sore throat - see above   Total time: 22 minutes. Greater than 50% of total time  spent doing patient education regarding acute cough and sore throat including symptom/medication management.     Next appt: 07/16/2024  Greig Gil BODILY  [facility] [phone]     [1]  Allergies Allergen Reactions   Crestor  [Rosuvastatin ]     Other Reaction(s): d/c'd due to elevated LFTs

## 2024-02-29 ENCOUNTER — Other Ambulatory Visit: Payer: Self-pay | Admitting: Cardiology

## 2024-02-29 LAB — SARS-COV-2 RNA,(COVID-19) QUALITATIVE NAAT: SARS CoV2 RNA: NOT DETECTED

## 2024-03-02 ENCOUNTER — Ambulatory Visit: Payer: Self-pay | Admitting: Orthopedic Surgery

## 2024-03-09 ENCOUNTER — Ambulatory Visit: Admitting: Physician Assistant

## 2024-03-09 ENCOUNTER — Encounter: Payer: Self-pay | Admitting: Physician Assistant

## 2024-03-09 VITALS — BP 98/60 | HR 90 | Ht 69.0 in | Wt 169.1 lb

## 2024-03-09 DIAGNOSIS — K5180 Other ulcerative colitis without complications: Secondary | ICD-10-CM

## 2024-03-09 DIAGNOSIS — K219 Gastro-esophageal reflux disease without esophagitis: Secondary | ICD-10-CM

## 2024-03-09 DIAGNOSIS — Z7952 Long term (current) use of systemic steroids: Secondary | ICD-10-CM | POA: Diagnosis not present

## 2024-03-09 DIAGNOSIS — K5190 Ulcerative colitis, unspecified, without complications: Secondary | ICD-10-CM

## 2024-03-09 NOTE — Progress Notes (Signed)
 "    03/09/2024 [name] [id number] 02-Jan-1961  Referring provider: Gil Greig BRAVO, NP Primary GI doctor: Dr. Federico  ASSESSMENT AND PLAN:     Pancolonic ulcerative colitis.  Last colonoscopy 07/13/2021 showed chronic ulcerative colitis to the right and left colon.  Entyvio  started on infusions in 03/2022, last antibody negative in Aug, trough low at 12, he is getting every 8 weeks. Last infusion 08/09/2023 due to proctectomy complicated by pelvic infection requiring PICC line and ABX, restarted 01/29/2024 injections 10/23/2022 TB GOLD NEGATIVE 05/19/2021  HepBsAG NON-REACTIVE     Latest Ref Rng & Units 01/30/2024   00:00 01/29/2024   12:56 05/30/2023   11:09 02/21/2022   11:01 05/19/2021   15:57  Inflammatory Markers  Calprotectin, Fecal 0 - 120 ug/g 835       **Results verified by repeat testing** Concentration     Interpretation   Follow-Up < 5 - 50 ug/g     Normal           None >50 -120 ug/g     Borderline       Re-evaluate in 4-6 weeks     >120 ug/g     Abnormal         Repeat as clinically                                    indicated       Sed Rate 0 - 20 mm/h  36  47  26  23   CRP <8.0 mg/L  25.5   1.5  1.5   01/29/2024 WBC 9.0 HGB 12.4 MCV 82.7  02/21/2022 Iron 137 Ferritin 97.7 B12 1,410 - check TB gold - Resumed vedolizumab  infusions; next scheduled for January. - Instructed stool sample collection for fecal calprotectin after second infusion. - Planned to recheck inflammatory markers post-second infusion. - Will obtain vedolizumab  trough and antibody levels before third infusion for interval adjustment. - Provided guidance to report hematochezia, increased urgency/tenesmus, or worsening diarrhea. - Discussed possible reduction of infusion interval to six weeks if any symptoms or continuing - Advised to avoid corticosteroids. - Reassured elevated fecal calprotectin not indicative of malignancy; reviewed colorectal cancer risk and recent negative colonoscopy. - Will  send reminders for lab and stool kit drop-off post-second infusion.    Hyperplastic polyp removed from the ascending colon per colonoscopy 07/13/2021.    GERD  Continue Pantoprazole  40mg  QD   Ankylosing spondylitis of multiple sites in spine.  Previously treated with Humira and Remicade  without noticeable improvement with UC symptoms.   No difference in symptoms Continue follow-up with Dr. Mai.  Proctectomy 09/26/2023  CAD On plavix   Patient Care Team: Gil Greig BRAVO, NP as PCP - General (Adult Health Nurse Practitioner) Elmira Newman JINNY, MD as PCP - Cardiology (Cardiology)  HISTORY OF PRESENT ILLNESS: 63 y.o. male presents for evaluation of coronary artery disease s/p stent to the LAD 2008 s/p STEMI s/p DES x two 11/2019 on ASA and Plavix ,  DM II, GERD, ankylosing spondylitis and ulcerative colitis . Last seen in the office on 10/08/2022   Current History Discussed the use of AI scribe software for clinical note transcription with the patient, who gave verbal consent to proceed.  History of Present Illness   [name] is a 63 year old male with ulcerative colitis and ankylosing spondylitis who presents for follow-up after recent interruption and resumption of Entyvio   therapy.  Ulcerative colitis was diagnosed in his late twenties and has been managed with Entyvio  infusions every eight weeks. In 2025, Entyvio  was held for approximately six months due to prostate cancer treatment and postoperative infection, with the last infusion prior to interruption on Aug 09, 2023. Entyvio  was resumed on January 29, 2024, and he has received one infusion since restarting.  Currently, gastrointestinal symptoms are the best he has been in thirty years. Typical bowel pattern consists of a morning bowel movement lasting about twenty minutes, followed by a second shortly after, with stability for the remainder of the day. Frequency and urgency are present primarily in the morning. He denies  blood in the stool, weight loss, nocturnal symptoms, tenesmus, or looser stools. No change in symptoms was noted after resuming Entyvio . Recent stool study showed elevated fecal calprotectin (835; normal <120) and elevated inflammatory markers (sedimentation rate and CRP), without perceived clinical worsening.  He experiences difficulty rising from the couch or getting out of the car, but has not had any falls. Mobility improves once moving. He is followed by a rheumatologist, who has told him that his ankylosing spondylitis is longstanding and that if he were younger, more could be done, but at this point, further intervention is unlikely.  Left leg symptoms have been present since prostate surgery, attributed to a nerve injury. He finds it easier to move his leg in certain ways, such as putting it up on an ottoman or getting out of the car. He recalls a prior nerve injury to his thumb that improved over several years and is hopeful for similar improvement in his leg.  He expresses concern about colon cancer due to family history and a friend's diagnosis, but recalls a normal colonoscopy in 2023.      Last colonoscopy:  Colonoscopy 07/13/2021 which showed mild ulcerative colitis Mayo score 1, diverticulosis to the sigmoid and descending colon and a 3-4 hyperplastic polyp in the ascending colon. Ascending colon with chronic colitis moderately active phase, further colon biopsies showed chronic ulcerative colitis throughout the colon at 90 - 40cm, nonspecific inflammation at 30 - 20 cm without evidence of active IBD then chronic ulcerative colitis at 10cm and no evidence of IBD to the rectum.  It was Extraintestinal manifestations: Follows Dr. Mai Surgical history: no surgery.  Last endoscopy: NA Last Abd CT/CTE/MRE:  09/30/2023 CTAP W interval prostatectomy, bilateral pelvic complex fluid collections abscesses containing foci of gas along pelvic sidewalls moderate mesenteric fat stranding left  posterolateral bladder diverticulum suggestive of infectious inflammatory process with abscess formation, hypodense lytic lesions L2 vertebral body indeterminant recommend MRI lumbar spine  Inflammatory Bowel Disease History  Previously on Humira and Remicade  for ankylosing spondylitis but was not helping colitis Prednisone  for years Entyvio  since 03/2022 Sulfasalazine  1000 mg twice daily  Entyvio  antibody 10/23/2022 negative however trough was only 12 Has been off Entyvio  for approximately 6-8 months ( last infusion was 08/08/2024) while undergoing prostate cancer treatment complicated by intra-abdominal abscess with slight increase in fecal calprotectin and sed rate.   Restarting intended to infusions 01/29/2024   Review of Data  The following data was reviewed at the time of this encounter:  IBD Labs  Inflammatory markers    Latest Ref Rng & Units 05/30/2023   11:09 01/29/2024   12:56 01/30/2024   00:00  Inflammatory Markers  Calprotectin, Fecal 0 - 120 ug/g   835       **Results verified by repeat testing** Concentration     Interpretation  Follow-Up < 5 - 50 ug/g     Normal           None >50 -120 ug/g     Borderline       Re-evaluate in 4-6 weeks     >120 ug/g     Abnormal         Repeat as clinically                                    indicated   Sed Rate 0 - 20 mm/h 47  36    CRP <8.0 mg/L  25.5      Prebiologic Labs    Latest Ref Rng & Units 05/19/2021   15:57 10/23/2022   12:12  Biologic Pre-Testing  Hepatitis B Surface Ag NON-REACTIVE NON-REACTIVE    QuantiFERON-TB Gold Plus NEGATIVE NEGATIVE       Negative test result. M. tuberculosis complex  infection unlikely.  NEGATIVE       Negative test result. M. tuberculosis complex  infection unlikely.     Therapeutic Drug Monitoring    Latest Ref Rng & Units 10/23/2022   12:12  IBD Medication Antibody  Anti-Vedolizumab  Antibody ng/mL <25        Quantitation Limit: < 25 ng/mL. Results of 25 or higher indicate  detection of anti- vedolizumab  antibodies.  COMMENTS:      - Anti-vedolizumab  antibodies developed in about 13%        of IBD patients.(5)      - Patients with persistently positive anti-vedolizumab         antibodies had undetectable or reduced vedolizumab         levels.(5)      - Anti-drug antibody positivity should be interpreted        in the context of the concomitant free drug level.      - Serial measurements over time may be helpful.      - This anti-vedolizumab  antibody assay is drug        tolerant, and all positive results are verified for        anti-drug antibody specificity by a confirmatory        test.  References: 1. Almond BRAVO, et al. Clin Gastroenterol Hepatol 7981;83:    [phone]. 2. Licia HERO, et al. Inflamm Bowel Dis 2014;20:S1-S3. 3. Herbie GREW, et al. Aliment Pharmacol Ther [phone]:    [phone]. 4. Garnock-Jones KP, et al. [employer] 2015;29:57-67. 5. [employer], Entyvio : [place]     prescribing information. Accessed 07 Feb 2015.  These tests were developed and their performance characteristics determined by [facility]. They have not been cleared or approved by the Food and Drug Administration.  However, these electrochemiluminescence immunoassay (ECLIA) measurements of vedolizumab  and anti-vedolizumab  antibody (constituting DoseASSURE VDZ) have been developed and validated in accordance with FDA Guidance document, Assay Development and Validation for Immunogenicity Testing of Therapeutic Protein Products (2019).   IBD Drug Levels  Vedolizumab  ug/mL 12        Quantitation Limit: <1.3 ug/mL Results of 1.3 or higher indicate detection of vedolizumab .  COMMENTS:      - The optimal drug concentration depends upon patient-        specific factors including the disease and desired        therapeutic endpoint.      - The following vedolizumab  trough concentration        targets have been proposed:            >  30.0 ug/mL at week 2 (1)             > 24.0 ug/mL at week 6 (1)            > 14 ug/mL during maintenance (1)       - Mucosal healing in UC was more common in patients        with higher week 6 trough levels (>30).(2)      - Highest quartile week 6 levels (>35.8) and lowest        quartile (<17.2) corresponded to week 52 remission        rates of 37% and 15%, respectively.(3)      - Patients with Crohn's Disease and Ulcerative Colitis        had similar vedolizumab  pharmacokinetic data.(4)      - This assay measures the antibody-unbound (free)        fraction of vedolizumab  when serum anti-vedolizumab         antibodies are present.      Laboratory Studies  CBC    Component Value Date/Time   WBC 9.0 01/29/2024 1256   RBC 4.62 01/29/2024 1256   HGB 12.4 (L) 01/29/2024 1256   HCT 38.2 (L) 01/29/2024 1256   PLT 421 (H) 01/29/2024 1256   MCV 82.7 01/29/2024 1256   MCH 26.8 (L) 01/29/2024 1256   MCHC 32.5 01/29/2024 1256   RDW 16.0 (H) 01/29/2024 1256   LYMPHSABS 1.9 08/29/2022 1150   MONOABS 0.4 08/29/2022 1150   EOSABS 518 (H) 01/16/2024 1211   BASOSABS 128 01/16/2024 1211   Recent Labs    09/17/23 1348 09/26/23 1815 09/27/23 0431 09/27/23 1319 09/28/23 0547 09/28/23 1753 09/29/23 0708 09/30/23 0437 01/16/24 1211 01/29/24 1256  HGB 13.8 13.0 10.8* 11.1* 11.4* 11.4* 10.6* 9.4* 13.0* 12.4*    CMP     Component Value Date/Time   NA 141 01/29/2024 1256   NA 140 03/03/2021 1122   K 4.7 01/29/2024 1256   CL 106 01/29/2024 1256   CO2 23 01/29/2024 1256   GLUCOSE 127 (H) 01/29/2024 1256   BUN 19 01/29/2024 1256   BUN 14 03/03/2021 1122   CREATININE 0.69 (L) 01/29/2024 1256   CALCIUM  10.0 01/29/2024 1256   PROT 7.4 01/29/2024 1256   PROT 7.3 03/03/2021 1122   ALBUMIN  4.2 09/17/2023 1348   ALBUMIN  4.8 03/03/2021 1122   AST 15 01/29/2024 1256   ALT 12 01/29/2024 1256   ALKPHOS 87 09/17/2023 1348   BILITOT 0.5 01/29/2024 1256   BILITOT 0.4 03/03/2021 1122   GFRNONAA >60 09/30/2023 0437    GFRAA >60 11/24/2019 0217      Latest Ref Rng & Units 01/29/2024   12:56 PM 01/16/2024   12:11 PM 09/17/2023    1:48 PM  Hepatic Function  Total Protein 6.1 - 8.1 g/dL 7.4  7.6  7.2   Albumin  3.5 - 5.0 g/dL   4.2   AST 10 - 35 U/L 15  12  18    ALT 9 - 46 U/L 12  13  20    Alk Phosphatase 38 - 126 U/L   87   Total Bilirubin 0.2 - 1.2 mg/dL 0.5  0.5  0.7      Micronutrient evaluation:    Latest Ref Rng & Units 02/21/2022   11:01 10/23/2022   12:12  Vitamins and Micronutrients  Ferritin 22.0 - 322.0 ng/mL 97.7    Iron 42 - 165 ug/dL 862    TIBC 749.9 - 549.0  mcg/dL 679.3    Folate >4.0 ng/mL  >23.8    Vaccinations:  Immunization History  Administered Date(s) Administered   Fluad Quad(high Dose 65+) 04/27/2021   Influenza, Quadrivalent, Recombinant, Inj, Pf 12/08/2019   Influenza, Seasonal, Injecte, Preservative Fre 01/16/2024   Influenza-Unspecified 12/27/2008, 12/28/2009, 01/08/2011, 02/05/2012, 04/28/2013, 12/08/2019   PFIZER(Purple Top)SARS-COV-2 Vaccination 06/18/2019, 07/17/2019   PNEUMOCOCCAL CONJUGATE-20 10/08/2022   Pneumococcal Polysaccharide-23 12/08/2019   Td 01/08/2011   Tdap 03/19/2010, 01/08/2011      RELEVANT GI HISTORY, LABS, IMAGING:  CBC    Component Value Date/Time   WBC 9.0 01/29/2024 1256   RBC 4.62 01/29/2024 1256   HGB 12.4 (L) 01/29/2024 1256   HCT 38.2 (L) 01/29/2024 1256   PLT 421 (H) 01/29/2024 1256   MCV 82.7 01/29/2024 1256   MCH 26.8 (L) 01/29/2024 1256   MCHC 32.5 01/29/2024 1256   RDW 16.0 (H) 01/29/2024 1256   LYMPHSABS 1.9 08/29/2022 1150   MONOABS 0.4 08/29/2022 1150   EOSABS 518 (H) 01/16/2024 1211   BASOSABS 128 01/16/2024 1211   Recent Labs    09/17/23 1348 09/26/23 1815 09/27/23 0431 09/27/23 1319 09/28/23 0547 09/28/23 1753 09/29/23 0708 09/30/23 0437 01/16/24 1211 01/29/24 1256  HGB 13.8 13.0 10.8* 11.1* 11.4* 11.4* 10.6* 9.4* 13.0* 12.4*    CMP     Component Value Date/Time   NA 141 01/29/2024 1256   NA  140 03/03/2021 1122   K 4.7 01/29/2024 1256   CL 106 01/29/2024 1256   CO2 23 01/29/2024 1256   GLUCOSE 127 (H) 01/29/2024 1256   BUN 19 01/29/2024 1256   BUN 14 03/03/2021 1122   CREATININE 0.69 (L) 01/29/2024 1256   CALCIUM  10.0 01/29/2024 1256   PROT 7.4 01/29/2024 1256   PROT 7.3 03/03/2021 1122   ALBUMIN  4.2 09/17/2023 1348   ALBUMIN  4.8 03/03/2021 1122   AST 15 01/29/2024 1256   ALT 12 01/29/2024 1256   ALKPHOS 87 09/17/2023 1348   BILITOT 0.5 01/29/2024 1256   BILITOT 0.4 03/03/2021 1122   GFRNONAA >60 09/30/2023 0437   GFRAA >60 11/24/2019 0217      Latest Ref Rng & Units 01/29/2024   12:56 PM 01/16/2024   12:11 PM 09/17/2023    1:48 PM  Hepatic Function  Total Protein 6.1 - 8.1 g/dL 7.4  7.6  7.2   Albumin  3.5 - 5.0 g/dL   4.2   AST 10 - 35 U/L 15  12  18    ALT 9 - 46 U/L 12  13  20    Alk Phosphatase 38 - 126 U/L   87   Total Bilirubin 0.2 - 1.2 mg/dL 0.5  0.5  0.7       Current Medications:   Current Outpatient Medications (Endocrine & Metabolic):    metformin  (FORTAMET ) 1000 MG (OSM) 24 hr tablet, Take 1 tablet (1,000 mg total) by mouth 2 (two) times daily with a meal.  Current Outpatient Medications (Cardiovascular):    icosapent  Ethyl (VASCEPA ) 1 g capsule, Take 2 capsules (2 g total) by mouth 2 (two) times daily.   lisinopril  (ZESTRIL ) 2.5 MG tablet, TAKE 1 TABLET(2.5 MG) BY MOUTH DAILY   metoprolol  succinate (TOPROL -XL) 50 MG 24 hr tablet, Take 1 tablet (50 mg total) by mouth daily. Take with or immediately following a meal.   nitroGLYCERIN  (NITROSTAT ) 0.4 MG SL tablet, Place 1 tablet (0.4 mg total) under the tongue every 5 (five) minutes as needed for chest pain.   REPATHA  SURECLICK 140 MG/ML SOAJ,  ADMINISTER 1 ML UNDER THE SKIN EVERY 14 DAYS  Current Outpatient Medications (Respiratory):    albuterol  (VENTOLIN  HFA) 108 (90 Base) MCG/ACT inhaler, INHALE 2 PUFFS INTO THE LUNGS EVERY 6 HOURS AS NEEDED FOR WHEEZING   loratadine  (CLARITIN ) 10 MG tablet,  Take 10 mg by mouth daily.   theophylline  (THEODUR) 300 MG 12 hr tablet, TAKE 1 TABLET BY MOUTH TWICE DAILY  Current Outpatient Medications (Analgesics):    oxyCODONE  (OXY IR/ROXICODONE ) 5 MG immediate release tablet, Take 1 tablet (5 mg total) by mouth every 6 (six) hours as needed for up to 12 doses for moderate pain (pain score 4-6).  Current Outpatient Medications (Hematological):    clopidogrel  (PLAVIX ) 75 MG tablet, Take 1 tablet (75 mg total) by mouth daily.  Current Outpatient Medications (Other):    cyclobenzaprine  (FLEXERIL ) 5 MG tablet, Take 1 tablet (5 mg total) by mouth at bedtime.   gabapentin  (NEURONTIN ) 100 MG capsule, Take 200 mg by mouth at bedtime.   Multiple Vitamins-Minerals (CENTRUM CARDIO) TABS, Take 1 tablet by mouth 2 (two) times daily.   pantoprazole  (PROTONIX ) 40 MG tablet, TAKE 1 TABLET(40 MG) BY MOUTH DAILY   polyethylene glycol (MIRALAX  / GLYCOLAX ) 17 g packet, Take 17 g by mouth 2 (two) times daily.   sulfaSALAzine  (AZULFIDINE ) 500 MG EC tablet, TAKE 2 TABLETS BY MOUTH TWICE DAILY   vedolizumab  (ENTYVIO ) 300 MG injection, Inject 300 mg into the vein every 8 (eight) weeks.  Medical History:  Past Medical History:  Diagnosis Date   Anemia    Ankylosing spondylitis (HCC)    Arthritis    Asthma    SINCE AGE 48   Colitis, ulcerative (HCC)    Coronary artery disease    Bare metal LAD stent 10/08   Coronary atherosclerosis of native coronary artery 12/25/2012   Depression    Diabetes mellitus without complication [facility])    ED (erectile dysfunction)    Elevated prostate specific antigen (PSA) 12/25/2012   Esophageal reflux 12/25/2012   Extrinsic asthma, unspecified 12/25/2012   Fibromyalgia    GERD (gastroesophageal reflux disease)    History of heart attack    Hypercalcemia 12/25/2012   Hyperlipidemia    Hypertension    Left sided ulcerative (chronic) colitis (HCC) 12/25/2012   Mixed hyperlipidemia 12/25/2012   Myocardial infarction (HCC)     Pneumonia    as a child   Prostate cancer (HCC) 06/06/2023   Pure hypercholesterolemia 12/25/2012   Skin cancer of eyelid    TMJ (dislocation of temporomandibular joint)    Allergies:  Allergies  Allergen Reactions   Crestor  [Rosuvastatin ]     Other Reaction(s): d/c'd due to elevated LFTs     Surgical History:  He  has a past surgical history that includes Tonsillectomy; skull fracture after falling down stairs (1983); Eye surgery (Left); Colonoscopy; Coronary/Graft Acute MI Revascularization (N/A, 11/23/2019); LEFT HEART CATH AND CORONARY ANGIOGRAPHY (N/A, 11/23/2019); Eye surgery (Left); Transurethral resection of bladder tumor (N/A, 06/06/2023); Prostate biopsy (N/A, 06/06/2023); Radiology with anesthesia (N/A, 08/27/2023); Cataract extraction, bilateral; and Robot assisted laparoscopic radical prostatectomy (N/A, 09/26/2023). Family History:  His family history includes Brain cancer in his sister; COPD in his maternal grandfather; Diabetes in his maternal grandmother; Emphysema in his maternal grandfather; GER disease in his sister; Hypertension in his mother; Lung cancer in his father and sister.  REVIEW OF SYSTEMS  : All other systems reviewed and negative except where noted in the History of Present Illness.  PHYSICAL EXAM: BP 98/60   Pulse  90   Ht 5' 9 (1.753 m)   Wt 169 lb 2 oz (76.7 kg)   BMI 24.98 kg/m  Physical Exam   GENERAL APPEARANCE: Well nourished, in no apparent distress. HEENT: No cervical lymphadenopathy, unremarkable thyroid, sclerae anicteric, conjunctiva pink. RESPIRATORY: Respiratory effort normal, BS equal bilateral without rales, rhonchi, wheezing. CARDIO: RRR with no MRGs, peripheral pulses intact. ABDOMEN: Soft, non distended, active bowel sounds in all 4 quadrants, no tenderness to palpation, no rebound, no mass appreciated. RECTAL: Declines. MUSCULOSKELETAL: Full ROM, normal gait, without edema. Normal movement observed. SKIN: Dry, intact without  rashes or lesions. No jaundice. NEURO: Alert, oriented, no focal deficits. PSYCH: Cooperative, normal mood and affect.        Alan JONELLE Coombs, PA-C 11:42 AM    "

## 2024-03-09 NOTE — Patient Instructions (Addendum)
 _______________________________________________________  If your blood pressure at your visit was 140/90 or greater, please contact your primary care physician to follow up on this.  _______________________________________________________  If you are age 63 or older, your body mass index should be between 23-30. Your Body mass index is 24.98 kg/m. If this is out of the aforementioned range listed, please consider follow up with your Primary Care Provider.  If you are age 44 or younger, your body mass index should be between 19-25. Your Body mass index is 24.98 kg/m. If this is out of the aformentioned range listed, please consider follow up with your Primary Care Provider.   ________________________________________________________  The [facility] providers would like to encourage you to use MYCHART to communicate with providers for non-urgent requests or questions.  Due to long hold times on the telephone, sending your provider a message by [facility] may be a faster and more efficient way to get a response.  Please allow 48 business hours for a response.  Please remember that this is for non-urgent requests.  _______________________________________________________  [facility] is using a team-based approach to care.  Your team is made up of your doctor and two to three APPS. Our APPS (Nurse Practitioners and Physician Assistants) work with your physician to ensure care continuity for you. They are fully qualified to address your health concerns and develop a treatment plan. They communicate directly with your gastroenterologist to care for you. Seeing the Advanced Practice Practitioners on your physician's team can help you by facilitating care more promptly, often allowing for earlier appointments, access to diagnostic testing, procedures, and other specialty referrals.   Your provider has requested that you go to the basement level for lab work the week of Jan 12th -Jan 19th. Press  B on the elevator. The lab is located at the first door on the left as you exit the elevator.  Due to recent changes in healthcare laws, you may see the results of your imaging and laboratory studies on MyChart before your provider has had a chance to review them.  We understand that in some cases there may be results that are confusing or concerning to you. Not all laboratory results come back in the same time frame and the provider may be waiting for multiple results in order to interpret others.  Please give us  48 hours in order for your provider to thoroughly review all the results before contacting the office for clarification of your results.   VISIT SUMMARY:  You had a follow-up visit to discuss your ulcerative colitis and ankylosing spondylitis. Your ulcerative colitis is currently well-managed with Entyvio , and you have resumed your infusions after a recent interruption. We also discussed your concerns about colon cancer and reviewed your recent colonoscopy results.  YOUR PLAN:  ULCERATIVE COLITIS: Your chronic ulcerative colitis is being managed with Entyvio  infusions. Recent interruptions in treatment were due to prostate cancer surgery and infection, which likely caused elevated inflammatory markers. -Continue with Entyvio  infusions; your next one is scheduled for January. -Collect a stool sample for fecal calprotectin after your second infusion. -We will recheck your inflammatory markers after your second infusion. -We will obtain vedolizumab  trough and antibody levels before your third infusion to adjust the interval if needed. -Report any blood in your stool, increased urgency, tenesmus, or worsening diarrhea. -We may consider reducing the infusion interval to six weeks if your symptoms persist. -Avoid corticosteroids. -Your elevated fecal calprotectin is not indicative of cancer. We reviewed your colorectal cancer risk and  your recent negative colonoscopy. -You will receive  reminders for lab and stool kit drop-off after your second infusion.

## 2024-03-10 ENCOUNTER — Other Ambulatory Visit: Payer: Self-pay | Admitting: Orthopedic Surgery

## 2024-03-16 DIAGNOSIS — C61 Malignant neoplasm of prostate: Secondary | ICD-10-CM | POA: Diagnosis not present

## 2024-03-16 DIAGNOSIS — N393 Stress incontinence (female) (male): Secondary | ICD-10-CM | POA: Diagnosis not present

## 2024-04-08 ENCOUNTER — Ambulatory Visit

## 2024-04-08 VITALS — BP 106/70 | HR 84 | Temp 97.5°F | Resp 20 | Ht 69.0 in | Wt 174.8 lb

## 2024-04-08 DIAGNOSIS — K5190 Ulcerative colitis, unspecified, without complications: Secondary | ICD-10-CM | POA: Diagnosis not present

## 2024-04-08 MED ORDER — VEDOLIZUMAB 300 MG IV SOLR
300.0000 mg | Freq: Once | INTRAVENOUS | Status: AC
  Administered 2024-04-08: 300 mg via INTRAVENOUS
  Filled 2024-04-08: qty 5

## 2024-04-08 NOTE — Progress Notes (Signed)
 Diagnosis: Ulcerative Colitis  Provider:  Mannam, Praveen MD  Procedure: IV Infusion  IV Type: Peripheral, IV Location: R wrist  Entyvio  (Vedolizumab ), Dose: 300 mg  Infusion Start Time: 1115  Infusion Stop Time: 1146  Post Infusion IV Care: Peripheral IV Discontinued  Discharge: Condition: Good, Destination: Home . AVS Declined  Performed by:  Donny Childes, BSN

## 2024-04-14 ENCOUNTER — Other Ambulatory Visit: Payer: Self-pay | Admitting: *Deleted

## 2024-06-02 ENCOUNTER — Ambulatory Visit: Admitting: Cardiology

## 2024-06-03 ENCOUNTER — Ambulatory Visit

## 2024-06-04 ENCOUNTER — Ambulatory Visit

## 2024-07-16 ENCOUNTER — Ambulatory Visit: Admitting: Orthopedic Surgery
